# Patient Record
Sex: Female | Born: 1945 | Race: White | Hispanic: No | Marital: Married | Smoking: Current every day smoker
Health system: Southern US, Community
[De-identification: ages and names within clinical notes are randomized; demographics above are authoritative.]

## PROBLEM LIST (undated history)

## (undated) DIAGNOSIS — J449 Chronic obstructive pulmonary disease, unspecified: Secondary | ICD-10-CM

## (undated) DIAGNOSIS — F329 Major depressive disorder, single episode, unspecified: Secondary | ICD-10-CM

## (undated) DIAGNOSIS — G8929 Other chronic pain: Secondary | ICD-10-CM

## (undated) DIAGNOSIS — M1990 Unspecified osteoarthritis, unspecified site: Secondary | ICD-10-CM

## (undated) DIAGNOSIS — K635 Polyp of colon: Secondary | ICD-10-CM

## (undated) DIAGNOSIS — I639 Cerebral infarction, unspecified: Secondary | ICD-10-CM

## (undated) DIAGNOSIS — I10 Essential (primary) hypertension: Secondary | ICD-10-CM

## (undated) DIAGNOSIS — K222 Esophageal obstruction: Secondary | ICD-10-CM

## (undated) DIAGNOSIS — E039 Hypothyroidism, unspecified: Secondary | ICD-10-CM

## (undated) DIAGNOSIS — E079 Disorder of thyroid, unspecified: Secondary | ICD-10-CM

## (undated) DIAGNOSIS — R911 Solitary pulmonary nodule: Secondary | ICD-10-CM

## (undated) DIAGNOSIS — F32A Depression, unspecified: Secondary | ICD-10-CM

## (undated) DIAGNOSIS — F419 Anxiety disorder, unspecified: Secondary | ICD-10-CM

## (undated) DIAGNOSIS — E876 Hypokalemia: Secondary | ICD-10-CM

## (undated) DIAGNOSIS — IMO0001 Reserved for inherently not codable concepts without codable children: Secondary | ICD-10-CM

## (undated) DIAGNOSIS — K3184 Gastroparesis: Secondary | ICD-10-CM

## (undated) DIAGNOSIS — G2581 Restless legs syndrome: Secondary | ICD-10-CM

## (undated) DIAGNOSIS — F40240 Claustrophobia: Secondary | ICD-10-CM

## (undated) DIAGNOSIS — E2839 Other primary ovarian failure: Secondary | ICD-10-CM

## (undated) DIAGNOSIS — Z87442 Personal history of urinary calculi: Secondary | ICD-10-CM

## (undated) DIAGNOSIS — K219 Gastro-esophageal reflux disease without esophagitis: Secondary | ICD-10-CM

## (undated) DIAGNOSIS — Q393 Congenital stenosis and stricture of esophagus: Secondary | ICD-10-CM

## (undated) DIAGNOSIS — K589 Irritable bowel syndrome without diarrhea: Secondary | ICD-10-CM

## (undated) DIAGNOSIS — E7800 Pure hypercholesterolemia, unspecified: Secondary | ICD-10-CM

## (undated) DIAGNOSIS — C189 Malignant neoplasm of colon, unspecified: Secondary | ICD-10-CM

## (undated) DIAGNOSIS — K649 Unspecified hemorrhoids: Secondary | ICD-10-CM

## (undated) HISTORY — DX: Cerebral infarction, unspecified: I63.9

## (undated) HISTORY — PX: CARPAL TUNNEL RELEASE: SHX101

## (undated) HISTORY — DX: Esophageal obstruction: K22.2

## (undated) HISTORY — DX: Hypokalemia: E87.6

## (undated) HISTORY — DX: Unspecified hemorrhoids: K64.9

## (undated) HISTORY — DX: Gastro-esophageal reflux disease without esophagitis: K21.9

## (undated) HISTORY — DX: Solitary pulmonary nodule: R91.1

## (undated) HISTORY — DX: Claustrophobia: F40.240

## (undated) HISTORY — DX: Other primary ovarian failure: E28.39

## (undated) HISTORY — PX: OTHER SURGICAL HISTORY: SHX169

## (undated) HISTORY — DX: Polyp of colon: K63.5

## (undated) HISTORY — DX: Malignant neoplasm of colon, unspecified: C18.9

## (undated) HISTORY — DX: Other chronic pain: G89.29

## (undated) HISTORY — DX: Congenital stenosis and stricture of esophagus: Q39.3

## (undated) HISTORY — DX: Reserved for inherently not codable concepts without codable children: IMO0001

---

## 1970-04-21 HISTORY — PX: TUBAL LIGATION: SHX77

## 1976-04-21 HISTORY — PX: CHOLECYSTECTOMY: SHX55

## 1997-08-26 ENCOUNTER — Emergency Department (HOSPITAL_COMMUNITY): Admission: EM | Admit: 1997-08-26 | Discharge: 1997-08-26 | Payer: Self-pay | Admitting: Cardiology

## 1999-04-22 DIAGNOSIS — I639 Cerebral infarction, unspecified: Secondary | ICD-10-CM

## 1999-04-22 HISTORY — DX: Cerebral infarction, unspecified: I63.9

## 2000-10-30 ENCOUNTER — Encounter: Payer: Self-pay | Admitting: *Deleted

## 2000-10-30 ENCOUNTER — Emergency Department (HOSPITAL_COMMUNITY): Admission: EM | Admit: 2000-10-30 | Discharge: 2000-10-30 | Payer: Self-pay | Admitting: *Deleted

## 2001-09-05 ENCOUNTER — Emergency Department (HOSPITAL_COMMUNITY): Admission: EM | Admit: 2001-09-05 | Discharge: 2001-09-05 | Payer: Self-pay | Admitting: Emergency Medicine

## 2002-05-02 ENCOUNTER — Ambulatory Visit (HOSPITAL_COMMUNITY): Admission: RE | Admit: 2002-05-02 | Discharge: 2002-05-02 | Payer: Self-pay | Admitting: Internal Medicine

## 2002-11-24 ENCOUNTER — Emergency Department (HOSPITAL_COMMUNITY): Admission: EM | Admit: 2002-11-24 | Discharge: 2002-11-24 | Payer: Self-pay | Admitting: *Deleted

## 2002-12-02 ENCOUNTER — Encounter: Payer: Self-pay | Admitting: Emergency Medicine

## 2002-12-02 ENCOUNTER — Emergency Department (HOSPITAL_COMMUNITY): Admission: EM | Admit: 2002-12-02 | Discharge: 2002-12-02 | Payer: Self-pay | Admitting: Emergency Medicine

## 2003-02-12 ENCOUNTER — Emergency Department (HOSPITAL_COMMUNITY): Admission: EM | Admit: 2003-02-12 | Discharge: 2003-02-12 | Payer: Self-pay | Admitting: Emergency Medicine

## 2003-07-11 ENCOUNTER — Emergency Department (HOSPITAL_COMMUNITY): Admission: EM | Admit: 2003-07-11 | Discharge: 2003-07-11 | Payer: Self-pay | Admitting: Emergency Medicine

## 2004-10-26 ENCOUNTER — Emergency Department (HOSPITAL_COMMUNITY): Admission: EM | Admit: 2004-10-26 | Discharge: 2004-10-26 | Payer: Self-pay | Admitting: Emergency Medicine

## 2005-03-05 ENCOUNTER — Emergency Department (HOSPITAL_COMMUNITY): Admission: EM | Admit: 2005-03-05 | Discharge: 2005-03-06 | Payer: Self-pay | Admitting: Emergency Medicine

## 2005-03-15 ENCOUNTER — Emergency Department (HOSPITAL_COMMUNITY): Admission: EM | Admit: 2005-03-15 | Discharge: 2005-03-16 | Payer: Self-pay | Admitting: Emergency Medicine

## 2006-04-16 ENCOUNTER — Emergency Department (HOSPITAL_COMMUNITY): Admission: EM | Admit: 2006-04-16 | Discharge: 2006-04-16 | Payer: Self-pay | Admitting: Emergency Medicine

## 2006-04-21 HISTORY — PX: KIDNEY STONE SURGERY: SHX686

## 2006-05-06 ENCOUNTER — Ambulatory Visit (HOSPITAL_COMMUNITY): Admission: RE | Admit: 2006-05-06 | Discharge: 2006-05-06 | Payer: Self-pay | Admitting: Urology

## 2006-05-19 ENCOUNTER — Ambulatory Visit (HOSPITAL_COMMUNITY): Admission: RE | Admit: 2006-05-19 | Discharge: 2006-05-19 | Payer: Self-pay

## 2006-05-26 ENCOUNTER — Observation Stay (HOSPITAL_COMMUNITY): Admission: RE | Admit: 2006-05-26 | Discharge: 2006-05-27 | Payer: Self-pay | Admitting: Urology

## 2006-06-01 ENCOUNTER — Ambulatory Visit (HOSPITAL_COMMUNITY): Admission: RE | Admit: 2006-06-01 | Discharge: 2006-06-01 | Payer: Self-pay | Admitting: Urology

## 2006-06-02 ENCOUNTER — Ambulatory Visit (HOSPITAL_COMMUNITY): Admission: RE | Admit: 2006-06-02 | Discharge: 2006-06-02 | Payer: Self-pay | Admitting: Urology

## 2006-09-18 ENCOUNTER — Ambulatory Visit: Payer: Self-pay | Admitting: Internal Medicine

## 2006-09-20 HISTORY — PX: COLONOSCOPY: SHX174

## 2006-09-30 ENCOUNTER — Ambulatory Visit (HOSPITAL_COMMUNITY): Admission: RE | Admit: 2006-09-30 | Discharge: 2006-09-30 | Payer: Self-pay | Admitting: Internal Medicine

## 2006-09-30 ENCOUNTER — Ambulatory Visit: Payer: Self-pay | Admitting: Internal Medicine

## 2006-11-11 ENCOUNTER — Ambulatory Visit: Payer: Self-pay | Admitting: Cardiovascular Disease

## 2006-11-13 ENCOUNTER — Ambulatory Visit: Payer: Self-pay | Admitting: Internal Medicine

## 2006-11-19 ENCOUNTER — Ambulatory Visit: Payer: Self-pay | Admitting: Cardiology

## 2006-11-19 ENCOUNTER — Encounter (HOSPITAL_COMMUNITY): Admission: RE | Admit: 2006-11-19 | Discharge: 2006-12-19 | Payer: Self-pay | Admitting: Cardiovascular Disease

## 2007-02-26 ENCOUNTER — Ambulatory Visit: Payer: Self-pay | Admitting: Internal Medicine

## 2007-03-01 ENCOUNTER — Ambulatory Visit (HOSPITAL_COMMUNITY): Admission: RE | Admit: 2007-03-01 | Discharge: 2007-03-01 | Payer: Self-pay | Admitting: Internal Medicine

## 2007-03-05 ENCOUNTER — Encounter (HOSPITAL_COMMUNITY): Admission: RE | Admit: 2007-03-05 | Discharge: 2007-04-04 | Payer: Self-pay | Admitting: Internal Medicine

## 2007-05-05 ENCOUNTER — Ambulatory Visit: Payer: Self-pay | Admitting: Internal Medicine

## 2007-06-18 ENCOUNTER — Ambulatory Visit: Payer: Self-pay | Admitting: Internal Medicine

## 2007-08-12 ENCOUNTER — Ambulatory Visit: Payer: Self-pay | Admitting: Internal Medicine

## 2007-08-17 ENCOUNTER — Ambulatory Visit (HOSPITAL_COMMUNITY): Admission: RE | Admit: 2007-08-17 | Discharge: 2007-08-17 | Payer: Self-pay | Admitting: Internal Medicine

## 2007-10-14 ENCOUNTER — Emergency Department (HOSPITAL_COMMUNITY): Admission: EM | Admit: 2007-10-14 | Discharge: 2007-10-14 | Payer: Self-pay | Admitting: Emergency Medicine

## 2007-10-21 ENCOUNTER — Ambulatory Visit: Payer: Self-pay | Admitting: Gastroenterology

## 2007-12-08 ENCOUNTER — Encounter (HOSPITAL_COMMUNITY): Admission: RE | Admit: 2007-12-08 | Discharge: 2008-01-07 | Payer: Self-pay | Admitting: Orthopaedic Surgery

## 2007-12-09 DIAGNOSIS — K219 Gastro-esophageal reflux disease without esophagitis: Secondary | ICD-10-CM | POA: Insufficient documentation

## 2007-12-09 DIAGNOSIS — K3184 Gastroparesis: Secondary | ICD-10-CM | POA: Insufficient documentation

## 2007-12-09 DIAGNOSIS — R143 Flatulence: Secondary | ICD-10-CM

## 2007-12-09 DIAGNOSIS — R141 Gas pain: Secondary | ICD-10-CM | POA: Insufficient documentation

## 2007-12-09 DIAGNOSIS — K449 Diaphragmatic hernia without obstruction or gangrene: Secondary | ICD-10-CM | POA: Insufficient documentation

## 2007-12-09 DIAGNOSIS — K644 Residual hemorrhoidal skin tags: Secondary | ICD-10-CM | POA: Insufficient documentation

## 2007-12-09 DIAGNOSIS — R5383 Other fatigue: Secondary | ICD-10-CM | POA: Insufficient documentation

## 2007-12-09 DIAGNOSIS — R142 Eructation: Secondary | ICD-10-CM

## 2007-12-09 DIAGNOSIS — R110 Nausea: Secondary | ICD-10-CM | POA: Insufficient documentation

## 2008-01-11 ENCOUNTER — Encounter (HOSPITAL_COMMUNITY): Admission: RE | Admit: 2008-01-11 | Discharge: 2008-01-18 | Payer: Self-pay | Admitting: Orthopaedic Surgery

## 2008-01-21 ENCOUNTER — Encounter (HOSPITAL_COMMUNITY): Admission: RE | Admit: 2008-01-21 | Discharge: 2008-02-20 | Payer: Self-pay | Admitting: Orthopaedic Surgery

## 2008-01-25 ENCOUNTER — Ambulatory Visit (HOSPITAL_COMMUNITY): Admission: RE | Admit: 2008-01-25 | Discharge: 2008-01-25 | Payer: Self-pay | Admitting: Pulmonary Disease

## 2008-02-14 ENCOUNTER — Encounter: Admission: RE | Admit: 2008-02-14 | Discharge: 2008-02-14 | Payer: Self-pay | Admitting: Pulmonary Disease

## 2008-03-01 ENCOUNTER — Encounter: Admission: RE | Admit: 2008-03-01 | Discharge: 2008-03-01 | Payer: Self-pay | Admitting: Pulmonary Disease

## 2008-03-21 ENCOUNTER — Encounter: Admission: RE | Admit: 2008-03-21 | Discharge: 2008-03-21 | Payer: Self-pay | Admitting: Pulmonary Disease

## 2008-05-04 ENCOUNTER — Ambulatory Visit (HOSPITAL_COMMUNITY): Admission: RE | Admit: 2008-05-04 | Discharge: 2008-05-04 | Payer: Self-pay | Admitting: Pulmonary Disease

## 2008-05-16 ENCOUNTER — Emergency Department (HOSPITAL_COMMUNITY): Admission: EM | Admit: 2008-05-16 | Discharge: 2008-05-17 | Payer: Self-pay | Admitting: Emergency Medicine

## 2008-06-21 ENCOUNTER — Ambulatory Visit: Payer: Self-pay | Admitting: Internal Medicine

## 2008-07-25 ENCOUNTER — Ambulatory Visit (HOSPITAL_COMMUNITY): Admission: RE | Admit: 2008-07-25 | Discharge: 2008-07-25 | Payer: Self-pay | Admitting: Orthopaedic Surgery

## 2008-08-03 ENCOUNTER — Ambulatory Visit: Payer: Self-pay | Admitting: Internal Medicine

## 2008-08-04 DIAGNOSIS — K5900 Constipation, unspecified: Secondary | ICD-10-CM | POA: Insufficient documentation

## 2008-09-01 ENCOUNTER — Encounter: Payer: Self-pay | Admitting: Internal Medicine

## 2008-09-26 ENCOUNTER — Ambulatory Visit (HOSPITAL_COMMUNITY): Admission: RE | Admit: 2008-09-26 | Discharge: 2008-09-26 | Payer: Self-pay | Admitting: Orthopaedic Surgery

## 2008-12-27 ENCOUNTER — Encounter (HOSPITAL_COMMUNITY): Admission: RE | Admit: 2008-12-27 | Discharge: 2009-01-17 | Payer: Self-pay | Admitting: Orthopaedic Surgery

## 2009-01-01 ENCOUNTER — Ambulatory Visit (HOSPITAL_COMMUNITY): Admission: RE | Admit: 2009-01-01 | Discharge: 2009-01-01 | Payer: Self-pay | Admitting: Pulmonary Disease

## 2009-01-01 ENCOUNTER — Ambulatory Visit: Payer: Self-pay | Admitting: Cardiology

## 2009-01-01 ENCOUNTER — Encounter (INDEPENDENT_AMBULATORY_CARE_PROVIDER_SITE_OTHER): Payer: Self-pay | Admitting: Pulmonary Disease

## 2009-01-12 ENCOUNTER — Ambulatory Visit: Payer: Self-pay | Admitting: Internal Medicine

## 2009-01-16 ENCOUNTER — Encounter: Payer: Self-pay | Admitting: Internal Medicine

## 2009-01-19 ENCOUNTER — Encounter (HOSPITAL_COMMUNITY): Admission: RE | Admit: 2009-01-19 | Discharge: 2009-02-18 | Payer: Self-pay | Admitting: Orthopaedic Surgery

## 2009-01-23 ENCOUNTER — Telehealth (INDEPENDENT_AMBULATORY_CARE_PROVIDER_SITE_OTHER): Payer: Self-pay

## 2009-01-30 ENCOUNTER — Telehealth (INDEPENDENT_AMBULATORY_CARE_PROVIDER_SITE_OTHER): Payer: Self-pay

## 2009-02-01 ENCOUNTER — Encounter: Payer: Self-pay | Admitting: Internal Medicine

## 2009-02-05 ENCOUNTER — Encounter (HOSPITAL_COMMUNITY): Admission: RE | Admit: 2009-02-05 | Discharge: 2009-03-07 | Payer: Self-pay | Admitting: Internal Medicine

## 2009-02-22 ENCOUNTER — Encounter: Payer: Self-pay | Admitting: Urgent Care

## 2009-03-13 ENCOUNTER — Emergency Department (HOSPITAL_COMMUNITY): Admission: EM | Admit: 2009-03-13 | Discharge: 2009-03-14 | Payer: Self-pay | Admitting: Emergency Medicine

## 2009-03-19 ENCOUNTER — Telehealth (INDEPENDENT_AMBULATORY_CARE_PROVIDER_SITE_OTHER): Payer: Self-pay

## 2009-04-10 ENCOUNTER — Telehealth (INDEPENDENT_AMBULATORY_CARE_PROVIDER_SITE_OTHER): Payer: Self-pay

## 2009-04-17 ENCOUNTER — Encounter: Payer: Self-pay | Admitting: Internal Medicine

## 2009-04-21 HISTORY — PX: FRACTURE SURGERY: SHX138

## 2009-04-30 ENCOUNTER — Encounter: Payer: Self-pay | Admitting: Gastroenterology

## 2009-05-01 ENCOUNTER — Ambulatory Visit (HOSPITAL_COMMUNITY): Admission: RE | Admit: 2009-05-01 | Discharge: 2009-05-02 | Payer: Self-pay | Admitting: Orthopaedic Surgery

## 2009-05-04 ENCOUNTER — Encounter: Payer: Self-pay | Admitting: Gastroenterology

## 2009-05-24 ENCOUNTER — Encounter: Payer: Self-pay | Admitting: Gastroenterology

## 2009-07-02 ENCOUNTER — Ambulatory Visit: Payer: Self-pay | Admitting: Internal Medicine

## 2009-07-10 ENCOUNTER — Encounter: Payer: Self-pay | Admitting: Internal Medicine

## 2009-07-16 ENCOUNTER — Telehealth (INDEPENDENT_AMBULATORY_CARE_PROVIDER_SITE_OTHER): Payer: Self-pay

## 2009-07-26 ENCOUNTER — Encounter: Payer: Self-pay | Admitting: Gastroenterology

## 2009-08-09 ENCOUNTER — Encounter: Payer: Self-pay | Admitting: Gastroenterology

## 2009-09-19 ENCOUNTER — Ambulatory Visit: Payer: Self-pay | Admitting: Internal Medicine

## 2009-09-19 DIAGNOSIS — R1013 Epigastric pain: Secondary | ICD-10-CM | POA: Insufficient documentation

## 2009-09-20 ENCOUNTER — Encounter: Payer: Self-pay | Admitting: Internal Medicine

## 2009-09-24 LAB — CONVERTED CEMR LAB
Alkaline Phosphatase: 95 units/L (ref 39–117)
Bilirubin, Direct: 0.1 mg/dL (ref 0.0–0.3)
HCT: 38 % (ref 36.0–46.0)
Indirect Bilirubin: 0.3 mg/dL (ref 0.0–0.9)
Lymphocytes Relative: 25 % (ref 12–46)
Lymphs Abs: 2 10*3/uL (ref 0.7–4.0)
Monocytes Relative: 6 % (ref 3–12)
Neutrophils Relative %: 69 % (ref 43–77)
Platelets: 248 10*3/uL (ref 150–400)
RBC: 4.32 M/uL (ref 3.87–5.11)
Total Bilirubin: 0.4 mg/dL (ref 0.3–1.2)
WBC: 8 10*3/uL (ref 4.0–10.5)

## 2009-10-01 ENCOUNTER — Encounter: Payer: Self-pay | Admitting: Urgent Care

## 2009-10-04 ENCOUNTER — Ambulatory Visit: Payer: Self-pay | Admitting: Gastroenterology

## 2009-10-09 ENCOUNTER — Ambulatory Visit (HOSPITAL_COMMUNITY): Admission: RE | Admit: 2009-10-09 | Discharge: 2009-10-09 | Payer: Self-pay | Admitting: Internal Medicine

## 2009-12-14 ENCOUNTER — Ambulatory Visit: Payer: Self-pay | Admitting: Internal Medicine

## 2009-12-21 ENCOUNTER — Encounter: Payer: Self-pay | Admitting: Internal Medicine

## 2009-12-27 ENCOUNTER — Telehealth (INDEPENDENT_AMBULATORY_CARE_PROVIDER_SITE_OTHER): Payer: Self-pay

## 2010-01-03 ENCOUNTER — Encounter: Payer: Self-pay | Admitting: Gastroenterology

## 2010-01-17 ENCOUNTER — Encounter: Payer: Self-pay | Admitting: Gastroenterology

## 2010-01-24 ENCOUNTER — Emergency Department (HOSPITAL_COMMUNITY)
Admission: EM | Admit: 2010-01-24 | Discharge: 2010-01-24 | Payer: Self-pay | Source: Home / Self Care | Admitting: Emergency Medicine

## 2010-02-19 ENCOUNTER — Encounter: Payer: Self-pay | Admitting: Urgent Care

## 2010-02-25 ENCOUNTER — Encounter: Payer: Self-pay | Admitting: Urgent Care

## 2010-02-27 ENCOUNTER — Telehealth (INDEPENDENT_AMBULATORY_CARE_PROVIDER_SITE_OTHER): Payer: Self-pay

## 2010-02-27 ENCOUNTER — Encounter: Payer: Self-pay | Admitting: Gastroenterology

## 2010-03-12 ENCOUNTER — Ambulatory Visit: Payer: Self-pay | Admitting: Internal Medicine

## 2010-03-18 ENCOUNTER — Encounter: Payer: Self-pay | Admitting: Internal Medicine

## 2010-03-21 ENCOUNTER — Encounter: Payer: Self-pay | Admitting: Gastroenterology

## 2010-04-21 HISTORY — PX: ESOPHAGOGASTRODUODENOSCOPY: SHX1529

## 2010-05-01 ENCOUNTER — Telehealth (INDEPENDENT_AMBULATORY_CARE_PROVIDER_SITE_OTHER): Payer: Self-pay

## 2010-05-02 ENCOUNTER — Encounter: Payer: Self-pay | Admitting: Internal Medicine

## 2010-05-03 ENCOUNTER — Encounter: Payer: Self-pay | Admitting: Internal Medicine

## 2010-05-10 ENCOUNTER — Ambulatory Visit (HOSPITAL_COMMUNITY)
Admission: RE | Admit: 2010-05-10 | Discharge: 2010-05-10 | Payer: Self-pay | Source: Home / Self Care | Attending: Internal Medicine | Admitting: Internal Medicine

## 2010-05-12 ENCOUNTER — Encounter: Payer: Self-pay | Admitting: Internal Medicine

## 2010-05-21 NOTE — Medication Information (Signed)
Summary: ONDANSETRON HCL 4MG   ONDANSETRON HCL 4MG    Imported By: Rexene Alberts 03/21/2010 10:16:13  _____________________________________________________________________  External Attachment:    Type:   Image     Comment:   External Document  Appended Document: ONDANSETRON HCL 4MG  sent rx at office visit a few days ago. please check with pharmacy to ensure they have it.  Appended Document: ONDANSETRON HCL 4MG  [facility] [place] [facility]. She said Rx has been filled, she will correct so it doesn't get sent back.

## 2010-05-21 NOTE — Letter (Signed)
Summary: [facility] REFERRAL  [facility] REFERRAL   Imported By: Ave Filter 03/18/2010 10:03:47  _____________________________________________________________________  External Attachment:    Type:   Image     Comment:   External Document  Appended Document: [facility] REFERRAL Pt has an appt 06/10/10@2 :30..Pt aware of appt.

## 2010-05-21 NOTE — Medication Information (Signed)
Summary: Tax adviser   Imported By: Diana Eves 07/26/2009 08:41:20  _____________________________________________________________________  External Attachment:    Type:   Image     Comment:   External Document  Appended Document: RX Folder-zofran    Prescriptions: ONDANSETRON HCL 4 MG TABS (ONDANSETRON HCL) one by mouth q6 hrs as needed nausea/vomiting  #60 x 0   Entered and Authorized by:   Leanna Battles. Dixon Boos   Signed by:   Leanna Battles Nayali Talerico PA-C on 07/27/2009   Method used:   [facility]  [place] [address]       [address]       [place], [place]  [address]       Ph: [phone] or [phone]       Fax: [phone]   RxID:   [id number]

## 2010-05-21 NOTE — Medication Information (Signed)
Summary: PROMETHAZINE 25MG   PROMETHAZINE 25MG    Imported By: Rexene Alberts 02/27/2010 08:32:19  _____________________________________________________________________  External Attachment:    Type:   Image     Comment:   External Document  Appended Document: PROMETHAZINE 25MG     Prescriptions: PROMETHAZINE HCL 25 MG SUPP (PROMETHAZINE HCL) one pr every 6 hours as needed n/v  #20 x 0   Entered and Authorized by:   Leanna Battles. Dixon Boos   Signed by:   Leanna Battles Lewis PA-C on 02/27/2010   Method used:   [facility]  [place] [address] [facility]       [address]       [place], [place]  [address]       Ph: [phone] or [phone]       Fax: [phone]   RxID:   [id number]     Appended Document: PROMETHAZINE 25MG  too much telephone traffic; she needs to be re-assessed in the office; please get her in to see extender wn next week , please  Appended Document: PROMETHAZINE 25MG  Informed pt.   Appended Document: PROMETHAZINE 25MG  pt aware of appt for 11/14 @ 11 w/AS

## 2010-05-21 NOTE — Medication Information (Signed)
Summary: Tax adviser   Imported By: Diana Eves 10/01/2009 11:11:07  _____________________________________________________________________  External Attachment:    Type:   Image     Comment:   External Document  Appended Document: RX Folder    Prescriptions: HYOSCYAMINE SULFATE 0.125 MG TABS (HYOSCYAMINE SULFATE) 1 by mouth ac/hs as needed (up to QID) for abd pain  #90 x 2   Entered and Authorized by:   Joselyn Arrow FNP-BC   Signed by:   Joselyn Arrow FNP-BC on 10/01/2009   Method used:   [facility]        [facility]  [place] [address] [facility]       [address]       [place], [place]  [address]       Ph: [phone] or [phone]       Fax: [phone]   RxID:   [id number]

## 2010-05-21 NOTE — Progress Notes (Signed)
Summary: phone note/nausea  Phone Note Call from Patient   Caller: Patient Summary of Call: Pt called and said she is having alot of nausea everyday, and sometimes vomiting. She is taking the Zofran one every 6 hours as directed and it doesn't come close to stopping the nausea. She said that Dr. Jena Gauss gave her some options when she was in office recently. One was referring to [facility]. She said she would not have transportation, she can't drive there and  has no one to depend on. Another was he suggested he could increase her Domperidone. ( She hasn't received last Rx from [place], but they say it can take up to 12-14 days.) She said please give her something to get rid of the nausea, it is horrible. I told her Dr. Jena Gauss is at the hospital and I will let him know, and get back with her. Initial call taken by: Cloria Spring LPN,  December 27, 2009 1:28 PM     Appended Document: phone note/nausea Being completely out of domperidone likely has much to do w symptoms; Rx Phenergan 25 mg suppositiories #20 - one PR q 6 hours - no refill - re-visit gastroparesis diet.  Appended Document: phone note/nausea Pt informed. [facility] [place] [facility] [place].

## 2010-05-21 NOTE — Progress Notes (Signed)
Summary: nausea  Phone Note Call from Patient Call back at Home Phone [phone]   Caller: Patient Summary of Call: pt called- she is still having alot of nausea. She is using the phenergan supp daily. She has some nausea and diarrhea at times. she stated she was following a gastroparesis diet.  pt wants to know if she should increase her domperidone and if she can have refill on phenergan supp. pt is aware RMR is out of the office and will not be back untill tomorrow.  Initial call taken by: Hendricks Limes LPN,  February 27, 2010 2:26 PM     Appended Document: nausea Please see note under 02/27/2010 RX Folder by Dr. Jena Gauss.  Appended Document: RE-ASSESSED IN OV NEXT WK BY EXTENDER PER RMR/SS Spoke with Dr. Jena Gauss about pt and medication management. Considered adding erythromycin. Spoke with pt today who states she is doing considerably better with n/v. taking zofran. will hold off on erythromycin for now, as pt has extensive med list which includes a statin. follow-up with pt in a few weeks to see how she is doing; if not better, may need to proceed with EGD to assess intermittent LUQ/epigastric pain.  Appended Document: RE-ASSESSED IN OV NEXT WK BY EXTENDER PER RMR/SS Pt has been taking off statin, no contraindication to begin erythromycin. will send to pharmacy of choice. instructed on administration. stated understanding.

## 2010-05-21 NOTE — Letter (Signed)
Summary: ondansetron hcl 4mg   ondansetron hcl 4mg    Imported By: Rexene Alberts 01/17/2010 12:04:09  _____________________________________________________________________  External Attachment:    Type:   Image     Comment:   External Document  Appended Document: ondansetron hcl 4mg     Prescriptions: ONDANSETRON HCL 4 MG TABS (ONDANSETRON HCL) one by mouth q6 hrs as needed nausea/vomiting  #120 x 1   Entered and Authorized by:   Leanna Battles. Dixon Boos   Signed by:   Leanna Battles Lewis PA-C on 01/18/2010   Method used:   [facility]  [place] [address]       [address]       [place], [place]  [address]       Ph: [phone] or [phone]       Fax: [phone]   RxID:   [id number]

## 2010-05-21 NOTE — Medication Information (Signed)
Summary: NEXIUM DR 40MG  CAPSULE  NEXIUM DR 40MG  CAPSULE   Imported By: Rexene Alberts 03/21/2010 15:35:14  _____________________________________________________________________  External Attachment:    Type:   Image     Comment:   External Document  Appended Document: NEXIUM DR 40MG  CAPSULE    Please clarify dosing. Per our records, she is on once daily but the refill request is for two times a day.   Appended Document: NEXIUM DR 40MG  CAPSULE Pt said that Dr. Jena Gauss increased her Nexium to two times a day the last time she saw him. She is  willing to do the one daily and see how she does.  Appended Document: rx for nexium    Prescriptions: NEXIUM 40 MG CPDR (ESOMEPRAZOLE MAGNESIUM) one po once to twice daily 30 mins before a meal  #60 x 5   Entered and Authorized by:   Leanna Battles. Dixon Boos   Signed by:   Leanna Battles Nakima Fluegge PA-C on 03/22/2010   Method used:   [facility]  [place] [address]       [address]       [place], [place]  [address]       Ph: [phone] or [phone]       Fax: [phone]   RxID:   [id number]

## 2010-05-21 NOTE — Medication Information (Signed)
Summary: PA for dexilant  PA for dexilant   Imported By: Hendricks Limes LPN 09/81/1912 78:29:56  _____________________________________________________________________  External Attachment:    Type:   Image     Comment:   External Document

## 2010-05-21 NOTE — Medication Information (Signed)
Summary: HYOSCYAMINE SULF  HYOSCYAMINE SULF   Imported By: Rexene Alberts 02/25/2010 10:40:52  _____________________________________________________________________  External Attachment:    Type:   Image     Comment:   External Document  Appended Document: HYOSCYAMINE SULF    Prescriptions: HYOSCYAMINE SULFATE 0.125 MG TABS (HYOSCYAMINE SULFATE) 1 by mouth ac/hs as needed (up to QID) for abd pain  #90 x 15   Entered and Authorized by:   Joselyn Arrow FNP-BC   Signed by:   Joselyn Arrow FNP-BC on 02/25/2010   Method used:   [facility]        [facility]  [place] [address] [facility]       [address]       [place], [place]  [address]       Ph: [phone] or [phone]       Fax: [phone]   RxID:   [id number]

## 2010-05-21 NOTE — Medication Information (Signed)
Summary: Tax adviser   Imported By: Diana Eves 04/30/2009 08:58:31  _____________________________________________________________________  External Attachment:    Type:   Image     Comment:   External Document  Appended Document: RX Folder No further refills until seen by RMR.  Multiple cancelled OVs including one for today.  Appended Document: RX Folder Spoke with pt. She said she has had to see alot of doctors, having alot of problems and had arm  surgery on Tuesday this week. when she called she had to leave mesage because she didn't get anyone, she just left the message to cancel.  Appended Document: RX Folder Noted. Needs to RS OV with RMR only.  Appended Document: RX Folder tried to call pt x2- call would not go through  Appended Document: RX Folder pt called back and is aware and will make appt

## 2010-05-21 NOTE — Op Note (Signed)
  [name], Tracey Mccullough               ACCOUNT NO.:  [id number]  MEDICAL RECORD NO.:  [id number]          PATIENT TYPE:  AMB  LOCATION:  DAY                           FACILITY:  [facility]  PHYSICIAN:  R. Roetta Sessions, M.D. DATE OF BIRTH:  [birth year]  DATE OF PROCEDURE:  05/10/2010 DATE OF DISCHARGE:                              OPERATIVE REPORT   PROCEDURE:  Esophagogastroduodenoscopy with Elease Hashimoto dilation.  INDICATIONS FOR PROCEDURE:  A 65 year old lady with gastroparesis, chronic nausea, on chronic narcotic therapy, intolerant to REGLAN, currently on domperidone, Nexium, and erythromycin.  Complains of intermittent epigastric pain and recurrent esophageal dysphagia in the setting of Schatzki ring.  EGD now being done.  Risks, benefits, limitations, alternatives, and imponderable have been reviewed potential for esophageal dilation, discussed, questions answered, all parties agreeable.  PROCEDURE NOTE:  O2 saturation, blood pressure, pulse, and respirations were monitored throughout the entire procedure.  CONSCIOUS SEDATION:  Versed 7 mg IV and Demerol 150 mg IV in divided doses.  Phenergan 12.5 mg diluted slow IV push to augment conscious sedation.  FINDINGS:  Examination of the tubular esophagus revealed again a Schatzki ring.  Esophageal mucosa otherwise appeared normal.  EG junction was easily traversed with the scope.  Stomach:  Gastric cavity had a slight amount of food residue and it was easily washed and suctioned out.  The gastric cavity insufflated well with air.  Thorough examination of the gastric mucosa including retroflexed proximal stomach esophagogastric junction demonstrated only a moderate size hiatal hernia, again the ring was seen very well without retroflexed.  Pylorus was patent, easily traversed.  Examination of the bulb, second portion revealed no abnormalities.  Therapeutic/diagnostic maneuvers performed: Scope was withdrawn.  A 56-French Maloney dilators  passed full insertion with ease look back revealed the ring had been nicely ruptured at 7 o'clock position without apparent complication.  There was minimal bleeding.  The patient tolerated the procedure well and was reactive in endoscopy.  IMPRESSION: 1. Schatzki ring, otherwise normal esophagus, status post dilation and     disruption as described above. 2. Hiatal hernia, minimal retained gastric contents suctioned out,     otherwise normal stomach, patent pylorus, normal D1 and D2.  RECOMMENDATIONS: 1. Minimize use of narcotics. 2. Continue domperidone. 3. Continue erythromycin and continue Nexium. 4. Followup appointment with [place] in 3 months.     Jonathon Bellows, M.D.     RMR/MEDQ  D:  05/10/2010  T:  05/10/2010  Job:  [id number]  cc:   Ramon Dredge L. Juanetta Gosling, M.D. Fax: [phone]  Electronically Signed by Lorrin Goodell M.D. on 05/21/2010 01:38:48 PM

## 2010-05-21 NOTE — Medication Information (Signed)
Summary: pa for dexilant  pa for dexilant   Imported By: Hendricks Limes LPN 16/01/9603 54:09:81  _____________________________________________________________________  External Attachment:    Type:   Image     Comment:   External Document

## 2010-05-21 NOTE — Medication Information (Signed)
Summary: HYOSCYAMINE SULF 0.125MG   HYOSCYAMINE SULF 0.125MG    Imported By: Rexene Alberts 03/21/2010 10:14:20  _____________________________________________________________________  External Attachment:    Type:   Image     Comment:   External Document  Appended Document: HYOSCYAMINE SULF 0.125MG     Prescriptions: HYOSCYAMINE SULFATE 0.125 MG TABS (HYOSCYAMINE SULFATE) 1 by mouth ac/hs as needed (up to QID) for abd pain  #90 x 5   Entered and Authorized by:   Leanna Battles. Dixon Boos   Signed by:   Leanna Battles Janella Rogala PA-C on 03/21/2010   Method used:   [facility]  [place] [address]       [address]       [place], [place]  [address]       Ph: [phone] or [phone]       Fax: [phone]   RxID:   [id number]

## 2010-05-21 NOTE — Assessment & Plan Note (Signed)
Summary: fu ov 3 months/GERD,constipation/refills/ss   Visit Type:  Follow-up Visit  Chief Complaint:  F/U gerd/constipation.  History of Present Illness: 65 y/o caucasian female w/ hx gastroparesis..  Every day nausea, on Level 2 gastroparesis diet.  c/o postprandial bloating.  c/o nausea with over-eating.  Doesn't eat much but can't understand "why stomach wont go down?"  Ran out of domperidone.  Pt doesn't see much a difference with domperidone, but has not had it for a while.  Vomiting twice per month.  BM "good" w/ miralax 2-3 per day, denies rectal bleeding or melena.  On nexium BID because dexilant wasn't covered by medicaid.  Takes zofran as needed, had been taking two times a day, off x several wks due to the fact she ran out.  Current Medications (verified): 1)  Aggrenox 200 .... Take 1 Tablet By Mouth Two Times A Day 2)  Amlodipine Besy-Benazepril Hcl 10-20 Mg Caps (Amlodipine Besy-Benazepril Hcl) .... Take 1 Capsule By Mouth Once A Day 3)  Promethazine Hcl 25 Mg Tabs (Promethazine Hcl) .... Take 1 Tablet By Mouth Every 4-6 Hours As Needed 4)  Hydrocodone-Acetaminophen 10-500 Mg Tabs (Hydrocodone-Acetaminophen) .... Take 1 Tablet By Mouth Four Times A Day As Needed 5)  Alprazolam 1 Mg Tabs (Alprazolam) .... Take 1 Tablet By Mouth Three Times A Day 6)  Domperidone .... As Directed 7)  Lasix 40 Mg Tabs (Furosemide) .... Take 1 Tablet By Mouth Once A Day As Needed 8)  Simvastatin 40 Mg Tabs (Simvastatin) .... Take 1 Tablet By Mouth Once A Day 9)  Miralax  Powd (Polyethylene Glycol 3350) .... Once Daily 10)  Ondansetron Hcl 4 Mg Tabs (Ondansetron Hcl) .... One By Mouth Q6 Hrs As Needed Nausea/vomiting 11)  Nexium 40 Mg Cpdr (Esomeprazole Magnesium) .... One By Mouth 30 Mins Before Breakfast Daily 12)  Requip 1 Mg Tabs (Ropinirole Hcl) .... One Tablet At Bedtime 13)  Womens Multivitamin Plus  Tabs (Multiple Vitamins-Minerals)  Allergies (verified): 1)  ! Penicillin 2)  !  Reglan  Review of Systems      See HPI General:  Denies fever, chills, sweats, anorexia, fatigue, weakness, malaise, weight loss, and sleep disorder. CV:  Denies chest pains, angina, palpitations, syncope, dyspnea on exertion, orthopnea, PND, peripheral edema, and claudication. Resp:  Denies dyspnea at rest, dyspnea with exercise, cough, sputum, wheezing, coughing up blood, and pleurisy. GI:  Denies difficulty swallowing, pain on swallowing, jaundice, bloody BM's, black BMs, and fecal incontinence. Derm:  Denies rash, itching, dry skin, hives, moles, warts, and unhealing ulcers. Psych:  Denies depression, anxiety, memory loss, suicidal ideation, hallucinations, paranoia, phobia, and confusion.  Vital Signs:  Patient profile:   65 year old female Height:      63 inches Weight:      164 pounds BMI:     29.16 Temp:     97.8 degrees F oral Pulse rate:   72 / minute BP sitting:   138 / 78  (left arm) Cuff size:   regular  Physical Exam  General:  Well developed, well nourished, no acute distress. Head:  Normocephalic and atraumatic. Eyes:  Sclera clear Ears:  decreased auditory acuity.   Mouth:  No deformity or lesions, dentition normal. Neck:  Supple; no masses or thyromegaly. Heart:  Regular rate and rhythm; no murmurs, rubs,  or bruits. Abdomen:  Soft, nontender and nondistended. No masses, hepatosplenomegaly or hernias noted. Normal bowel sounds.without guarding and without rebound.   Msk:  Symmetrical with no gross deformities. Normal  posture. Extremities:  No clubbing, cyanosis, edema or deformities noted. Neurologic:  Alert and  oriented x4;  grossly normal neurologically. Skin:  Intact without significant lesions or rashes. Cervical Nodes:  No significant cervical adenopathy. Psych:  Alert and cooperative. Normal mood and affect.  Impression & Recommendations:  Problem # 1:  GERD (ICD-530.81) Occasional breakthrough on nexium two times a day, suspect worsened w/  gastroparesis.  Problem # 2:  Hx of GASTROPARESIS (ICD-536.3) Pt not taking domperidone.  She will resume this.  If no relief w/ this and small frequent meals, progressive GP diet, would suggest tertiary referral for ?other options, ie gastric pacing.  Problem # 3:  CONSTIPATION (ICD-564.00) Doing well on miralax.  Patient Instructions: 1)  Colonoscopy 2013 Prescriptions: ONDANSETRON HCL 4 MG TABS (ONDANSETRON HCL) one by mouth q6 hrs as needed nausea/vomiting  #60 x 0   Entered and Authorized by:   Joselyn Arrow FNP-BC   Signed by:   Joselyn Arrow FNP-BC on 07/02/2009   Method used:   [facility]  [place] [address]       [address]       [place], [place]  [address]       Ph: [phone] or [phone]       Fax: [phone]   RxID:   [id number]      Appended Document: Orders Update    Clinical Lists Changes  Orders: Added new Service order of Est. Patient Level III (27253) - Signed

## 2010-05-21 NOTE — Assessment & Plan Note (Signed)
Summary: DROPPED OFF STOOL/SS  pt returned ifobt and it was negative  Allergies: 1)  ! Penicillin 2)  ! Reglan  Other Orders: Immuno-chemical Fecal Occult (696[address])  Appended Document: DROPPED OFF STOOL/SS noted; await u/s report

## 2010-05-21 NOTE — Medication Information (Signed)
Summary: Tax adviser   Imported By: Rosine Beat 01/03/2010 08:40:02  _____________________________________________________________________  External Attachment:    Type:   Image     Comment:   External Document  Appended Document: RX Folder, phenergan    Prescriptions: PROMETHAZINE HCL 25 MG SUPP (PROMETHAZINE HCL) one pr every 6 hours as needed n/v  #20 x 0   Entered and Authorized by:   Leanna Battles. Dixon Boos   Signed by:   Leanna Battles Lewis PA-C on 01/03/2010   Method used:   [facility]  [place] [address] [facility]       [address]       [place], [place]  [address]       Ph: [phone] or [phone]       Fax: [phone]   RxID:   [id number]

## 2010-05-21 NOTE — Assessment & Plan Note (Signed)
Summary: STOMACH PAIN,NAUSEA,MEDS ARENT WORKING/SS   Visit Type:  Follow-up Visit Primary Care Provider:  hawkins  Chief Complaint:  nausea and abd pain.  History of Present Illness: Patient complains of ongoing nausea sometimes she vomits.  Takes MiraLax at bedtime sometimes has accident with a bowel movement in the the middle the night.  No Side effects with domperidone. Ran out recently.  Current Problems (verified): 1)  Colorectal Cancer, Family Hx  (ICD-V16.0) 2)  Abdominal Pain, Epigastric  (ICD-789.06) 3)  Constipation  (ICD-564.00) 4)  Hx of Fatigue  (ICD-780.79) 5)  Fh of Colon Cancer  (ICD-153.9) 6)  Fh of Leukemia  (ICD-208.90) 7)  Hx of Hemorrhoids, External  (ICD-455.3) 8)  Hx of Hiatal Hernia  (ICD-553.3) 9)  Hx of Gerd, Severe  (ICD-530.81) 10)  Hx of Gastroparesis  (ICD-536.3) 11)  Abdominal Bloating  (ICD-787.3) 12)  Nausea  (ICD-787.02)  Current Medications (verified): 1)  Aggrenox 200 .... Take 1 Tablet By Mouth Two Times A Day 2)  Amlodipine Besy-Benazepril Hcl 10-20 Mg Caps (Amlodipine Besy-Benazepril Hcl) .... Take 1 Capsule By Mouth Once A Day 3)  Hydrocodone-Acetaminophen 10-500 Mg Tabs (Hydrocodone-Acetaminophen) .... Take 1 Tablet By Mouth Four Times A Day As Needed 4)  Alprazolam 1 Mg Tabs (Alprazolam) .... Take 1 Tablet By Mouth Three Times A Day 5)  Domperidone 10 Mg .... Qid 6)  Lasix 40 Mg Tabs (Furosemide) .... Take 1 Tablet By Mouth Once A Day As Needed 7)  Simvastatin 40 Mg Tabs (Simvastatin) .... Take 1 Tablet By Mouth Once A Day 8)  Miralax  Powd (Polyethylene Glycol 3350) .... Once Daily 9)  Ondansetron Hcl 4 Mg Tabs (Ondansetron Hcl) .... One By Mouth Q6 Hrs As Needed Nausea/vomiting 10)  Nexium 40 Mg Cpdr (Esomeprazole Magnesium) .... One By Mouth 30 Mins Before Breakfast Daily 11)  Requip 1 Mg Tabs (Ropinirole Hcl) .... One Tablet At Bedtime 12)  Womens Multivitamin Plus  Tabs (Multiple Vitamins-Minerals) 13)  Magnesium 250 Mg .... Once  Daily 14)  Hyoscyamine Sulfate 0.125 Mg Tabs (Hyoscyamine Sulfate) .Marland Kitchen.. 1 By Mouth Ac/hs As Needed (Up To Qid) For Abd Pain  Allergies (verified): 1)  ! Penicillin 2)  ! Reglan  Past History:  Past Medical History: Last updated: Jan 16, 2009 Current Problems:  CONSTIPATION (ICD-564.00) Hx of FATIGUE (ICD-780.79) Family Hx of COLON CANCER (ICD-153.9) Family Hx of LEUKEMIA (ICD-208.90) Hx of HEMORRHOIDS, EXTERNAL (ICD-455.3) Hx of HIATAL HERNIA (ICD-553.3) Hx of GERD, SEVERE (ICD-530.81) Hx of GASTROPARESIS (ICD-536.3) ABDOMINAL BLOATING (ICD-787.3) NAUSEA (ICD-787.02)  Family History: Last updated: Jan 16, 2009 Father:deceased age 3 natural causes Mother:deceased age 70 stroke Siblings:1 brother alive and well 4 brothers deceased 3 unknown causes 1 colon cancer 4 sisters alive and well 1 deceased sister age 53yrs to to complications of strep throat  Social History: Last updated: Jan 16, 2009 Disabled  Divorced  Tobacco Use - Yes.  Alcohol Use - no Regular Exercise - yes Drug Use - no  Risk Factors: Exercise: yes (01/16/2009)  Risk Factors: Smoking Status: current (01/16/2009)  Past Surgical History: Cholecystectomy Tubal Ligation Benign Cyst Removal kidney stones wrist  Vital Signs:  Patient profile:   65 year old female Height:      63 inches Weight:      152 pounds BMI:     27.02 Temp:     97.9 degrees F oral Pulse rate:   80 / minute BP sitting:   104 / 70  (left arm) Cuff size:   regular  Vitals Entered By: Raynelle Fanning  [facility] LPN (December 14, 2009 9:32 AM)  Physical Exam  General:  alert conversant lady no acute distress Abdomen:  flat positive bowel sounds soft nontender no succussion splash  Impression & Recommendations: Impression: A 65 year old lady with chronic nausea borderline delayed gastric emptying in a setting of narcotic use. Chronic constipation likely related to same.  Recommendations:get medication refill  ;take domperidone as  directed.  Gastroparesis diet reviewed. Take MiraLax first thing in the morning and daily prn. Consider stopping hydrocodone for one week and see how she does.  I've actually ask her to do this. She is to call in one week and let us to how things are going after she's resumed domperidone.  Other Orders: Est. Patient Level III (96295)

## 2010-05-21 NOTE — Medication Information (Signed)
Summary: Tax adviser   Imported By: Diana Eves 05/04/2009 08:42:15  _____________________________________________________________________  External Attachment:    Type:   Image     Comment:   External Document  Appended Document: RX Folder - zofran    Prescriptions: ONDANSETRON HCL 4 MG TABS (ONDANSETRON HCL) one by mouth q6 hrs as needed nausea/vomiting  #30 x 0   Entered and Authorized by:   Leanna Battles. Dixon Boos   Signed by:   Leanna Battles Naseem Varden PA-C on 05/04/2009   Method used:   [facility]        [facility]  [place] [address] [facility]       [address]       [place], [place]  [address]       Ph: [phone] or [phone]       Fax: [phone]   RxID:   [id number]    No further refills without OV with RMR

## 2010-05-21 NOTE — Letter (Signed)
Summary: Internal Other [facility] Order  Internal Other [facility] Order   Imported By: Cloria Spring LPN 32/44/0102 72:53:66  _____________________________________________________________________  External Attachment:    Type:   Image     Comment:   External Document

## 2010-05-21 NOTE — Progress Notes (Signed)
Summary: Pt c/o colon and abd spasms  Phone Note Call from Patient   Caller: Patient Summary of Call: Pt left VM that she has been having spasms in her colon and lower abdomen.They are occurring everyday and sometimes are severe. She is also having some nausea and requests something to be called in for nausea. Pt said she is on clear liquids at this time because she has not received the Domperidone in mail. Please advise.  ( [facility] Initial call taken by: Cloria Spring LPN,  July 16, 2009 4:13 PM     Appended Document: Pt c/o colon and abd spasms Meds for spasms will make gastroparesis worse, but can take sparingly.  Levsin 0.125mg  two times a day as needed for stomach cramps, #60, 0RF.  Resume domperidone asap.  Call if worse.  Appended Document: Pt c/o colon and abd spasms Pt informed. [facility].  Appended Document: Pt c/o colon and abd spasms pt called- she stated "spasm meds" seemed to be working ok untill last night. Since last night she has had increased upper abd pain that radiates to her side and back with nausea and vomiting. she is still on step 1-full liquids- of her gastroparesis diet. Her domperidone has not come yet from [place] (she called them and she should have it by Monday) Pt stated the pain is terrible and wants to know if there is anything she can do? please advise  Appended Document: Pt c/o colon and abd spasms Back off to a clear liquid diet for now. Use Levsin 0.125 mg tablets sublingually AC and h.s.-may use up to 4 times daily for the next 2 days until things settled down. Begin domperidone when he arrives.  Appended Document: Pt c/o colon and abd spasms pt aware

## 2010-05-21 NOTE — Letter (Signed)
Summary: ABD U/S ORDER  ABD U/S ORDER   Imported By: Ave Filter 09/20/2009 10:49:47  _____________________________________________________________________  External Attachment:    Type:   Image     Comment:   External Document

## 2010-05-21 NOTE — Medication Information (Signed)
Summary: PROMETHAZINE 25MG   PROMETHAZINE 25MG    Imported By: Rexene Alberts 02/19/2010 08:53:20  _____________________________________________________________________  External Attachment:    Type:   Image     Comment:   External Document  Appended Document: PROMETHAZINE 25MG     Prescriptions: PROMETHAZINE HCL 25 MG SUPP (PROMETHAZINE HCL) one pr every 6 hours as needed n/v  #20 x 0   Entered and Authorized by:   Joselyn Arrow FNP-BC   Signed by:   Joselyn Arrow FNP-BC on 02/19/2010   Method used:   [facility]        [facility]  [place] [address] [facility]       [address]       [place], [place]  [address]       Ph: [phone] or [phone]       Fax: [phone]   RxID:   [id number]

## 2010-05-21 NOTE — Assessment & Plan Note (Signed)
Summary: follow up per KJ for Nausea- cdg   Visit Type:  Follow-up Visit Primary Care Provider:  hawkins  Chief Complaint:  nausea, severe abd pain, and bloating.  History of Present Illness: 65 year old lady with GERD/ erosive reflux esophagitis documented gastrioparesis and constipation here for followup. She's had intermittent waves of nausea and vomiting had been on domperidone came off that agent. Now back on domperidone 10 mg a.c. and h.s.; takes Nexium 40 mg each morning. She does have intermittent nausea and vomiting but feels domperidone does,  indeed, help.  has intermittent abdominal distention and bloating but not temporally related to vomiting. She underwent EGD and high-risk screening colonoscopy in 2008. Schatzki's ring was dilated at that time. Colonoscopy was negative. Because of her family history of colon cancer( brother age 73), will be due repeat screening exam in 2013. She's not had any dysphagia.  MiraLax controlling constipation very well.  Current Problems (verified): 1)  Constipation  (ICD-564.00) 2)  Hx of Fatigue  (ICD-780.79) 3)  Fh of Colon Cancer  (ICD-153.9) 4)  Fh of Leukemia  (ICD-208.90) 5)  Hx of Hemorrhoids, External  (ICD-455.3) 6)  Hx of Hiatal Hernia  (ICD-553.3) 7)  Hx of Gerd, Severe  (ICD-530.81) 8)  Hx of Gastroparesis  (ICD-536.3) 9)  Abdominal Bloating  (ICD-787.3) 10)  Nausea  (ICD-787.02)  Current Medications (verified): 1)  Aggrenox 200 .... Take 1 Tablet By Mouth Two Times A Day 2)  Amlodipine Besy-Benazepril Hcl 10-20 Mg Caps (Amlodipine Besy-Benazepril Hcl) .... Take 1 Capsule By Mouth Once A Day 3)  Promethazine Hcl 25 Mg Tabs (Promethazine Hcl) .... Take 1 Tablet By Mouth Every 4-6 Hours As Needed 4)  Hydrocodone-Acetaminophen 10-500 Mg Tabs (Hydrocodone-Acetaminophen) .... Take 1 Tablet By Mouth Four Times A Day As Needed 5)  Alprazolam 1 Mg Tabs (Alprazolam) .... Take 1 Tablet By Mouth Three Times A Day 6)  Domperidone .... Qid 7)   Lasix 40 Mg Tabs (Furosemide) .... Take 1 Tablet By Mouth Once A Day As Needed 8)  Simvastatin 40 Mg Tabs (Simvastatin) .... Take 1 Tablet By Mouth Once A Day 9)  Miralax  Powd (Polyethylene Glycol 3350) .... Once Daily 10)  Ondansetron Hcl 4 Mg Tabs (Ondansetron Hcl) .... One By Mouth Q6 Hrs As Needed Nausea/vomiting 11)  Nexium 40 Mg Cpdr (Esomeprazole Magnesium) .... One By Mouth 30 Mins Before Breakfast Daily 12)  Requip 1 Mg Tabs (Ropinirole Hcl) .... One Tablet At Bedtime 13)  Womens Multivitamin Plus  Tabs (Multiple Vitamins-Minerals) 14)  Magnesium 250 Mg .... Once Daily 15)  Hyoscyamine Sulfate 0.125 Mg Tabs (Hyoscyamine Sulfate) .... Two Times A Day As Needed  Allergies (verified): 1)  ! Penicillin 2)  ! Reglan  Past History:  Past Medical History: Last updated: January 28, 2009 Current Problems:  CONSTIPATION (ICD-564.00) Hx of FATIGUE (ICD-780.79) Family Hx of COLON CANCER (ICD-153.9) Family Hx of LEUKEMIA (ICD-208.90) Hx of HEMORRHOIDS, EXTERNAL (ICD-455.3) Hx of HIATAL HERNIA (ICD-553.3) Hx of GERD, SEVERE (ICD-530.81) Hx of GASTROPARESIS (ICD-536.3) ABDOMINAL BLOATING (ICD-787.3) NAUSEA (ICD-787.02)  Past Surgical History: Last updated: January 28, 2009 Cholecystectomy Tubal Ligation Benign Cyst Removal kidney stones  Family History: Last updated: 2009-01-28 Father:deceased age 29 natural causes Mother:deceased age 47 stroke Siblings:1 brother alive and well 4 brothers deceased 3 unknown causes 1 colon cancer 4 sisters alive and well 1 deceased sister age 22yrs to to complications of strep throat  Social History: Last updated: 28-Jan-2009 Disabled  Divorced  Tobacco Use - Yes.  Alcohol Use - no Regular  Exercise - yes Drug Use - no  Risk Factors: Exercise: yes (01/01/2009)  Risk Factors: Smoking Status: current (01/01/2009)  Vital Signs:  Patient profile:   65 year old female Height:      63 inches Weight:      154 pounds BMI:     27.38 Temp:      98.8 degrees F oral Pulse rate:   72 / minute BP sitting:   110 / 64  (left arm) Cuff size:   regular  Vitals Entered By: Hendricks Limes LPN (September 20, 5954 2:59 PM)  Physical Exam  General:  pleasant and in individual and no acute distress. Somewhat hard of hearing. Lungs:  clear to auscultation Heart:  regular rate and rhythm without murmur gallop or a Abdomen:  nondistended positive bowel sounds no succussion splash. Abdomen is entirely soft and nontender without appreciable mass or organomegaly  Impression & Recommendations: Impression: 65 year old lady with history of GERD and gastroparesis with intermittent nausea and vomiting although better with domperidone. Intermittent epigastric pain occurs 2-3 times weekly. Gallbladder is out. No dysphagia. No lower GI tract symptoms. No melena or rectal bleeding.  Recommendations: Continue domperidone 10 mg a.c. and h.s. and  increase Nexium to 40 mg orally twice daily,ie before breakfast and supper.  Hepatic profile, CBC, and lipase/ right upper quadrant ultrasound to assess for biliary dilation gallstone disease, etc.  Refille Zofran 4 mg tablets one every 6 hours p.r.n. nausea #60.  Other Orders: T-Hepatic Function [id number]) T-CBC w/Diff [id number]) T-Lipase [id number])  Appended Document: Orders Update    Clinical Lists Changes  Problems: Added new problem of ABDOMINAL PAIN, EPIGASTRIC (ICD-789.06) Added new problem of COLORECTAL CANCER, FAMILY HX (ICD-V16.0) Orders: Added new Service order of Est. Patient Level IV (35573) - Signed

## 2010-05-23 NOTE — Assessment & Plan Note (Signed)
Summary: RE-ASSESSED IN OV NEXT WK BY EXTENDER PER RMR/SS   Visit Type:  Follow-up Visit Primary Care [name]:  hawkins  CC:  nausea and and abd pain.  History of Present Illness: [name] is a pleasant 65 year old female who has a long-standing hx of chronic N/V, mild gastroparesis. She was on Reglan in the past but due to muscle twitching in March 2010 was switched to Domperidone 10 mg qac and at bedtime. This has been controlling the N/V fairly well. She was on phenergan, taking multiple doses, approximately one year ago; due to some symptoms congruent with tardive dyskinesia, this was changed to zofran. As of a few months ago, she actually started taking phenergan again, which she reports as alleviating some nausea. Reports continued N/V, intermittent epigastric pain and LUQ pain started over weekend, worsened with eating. Takes Nexium two times a day. Denies reflux. On narcotics and did take a week off as instructed by our office, yet she had no improvement in her symptoms. Last GES in Oct 2010 showed mild delayed gastric emptying. Last EGD/colon in June 2008:  Distal esophageal erosions consistent with erosive reflux esophagitis, Schatzki's ring, status post dilation disruption as described above, otherwise normal esophagus. Small hiatal hernia, linear antral erosions probably secondary to Aggrenox, otherwise normal gastric mucosa, patent pylorus, normal D-1  and D-2. Colonoscopy findings:  External hemorrhoids, otherwise normal rectum and  colon.   Current Medications (verified): 1)  Aggrenox 200 .... Take 1 Tablet By Mouth Two Times A Day 2)  Amlodipine Besy-Benazepril Hcl 10-20 Mg Caps (Amlodipine Besy-Benazepril Hcl) .... Take 1 Capsule By Mouth Once A Day 3)  Hydrocodone-Acetaminophen 10-500 Mg Tabs (Hydrocodone-Acetaminophen) .... Take 1 Tablet By Mouth Four Times A Day As Needed 4)  Alprazolam 1 Mg Tabs (Alprazolam) .... Take 1 Tablet By Mouth Three Times A Day 5)  Domperidone 10 Mg  .... Qid 6)  Lasix 40 Mg Tabs (Furosemide) .... Take 1 Tablet By Mouth Once A Day As Needed 7)  Simvastatin 40 Mg Tabs (Simvastatin) .... Take 1 Tablet By Mouth Once A Day 8)  Miralax  Powd (Polyethylene Glycol 3350) .... Once Daily 9)  Nexium 40 Mg Cpdr (Esomeprazole Magnesium) .... One By Mouth 30 Mins Before Breakfast Daily 10)  Requip 1 Mg Tabs (Ropinirole Hcl) .... One Tablet At Bedtime 11)  Magnesium 250 Mg .... Once Daily 12)  Hyoscyamine Sulfate 0.125 Mg Tabs (Hyoscyamine Sulfate) .Marland Kitchen.. 1 By Mouth Ac/hs As Needed (Up To Qid) For Abd Pain  Allergies (verified): 1)  ! Penicillin 2)  ! Reglan  Past History:  Past Medical History: Current Problems:  CONSTIPATION (ICD-564.00) Hx of FATIGUE (ICD-780.79) Family Hx of COLON CANCER (ICD-153.9) Family Hx of LEUKEMIA (ICD-208.90) Hx of HEMORRHOIDS, EXTERNAL (ICD-455.3) Hx of HIATAL HERNIA (ICD-553.3) Hx of GERD, SEVERE (ICD-530.81) Hx of GASTROPARESIS (ICD-536.3) ABDOMINAL BLOATING (ICD-787.3) NAUSEA (ICD-787.02) June 2008:  Esophagogastroduodenoscopy:  Distal esophageal erosions   consistent with erosive reflux esophagitis, Schatzki's ring, status post   dilation disruption as described above, otherwise normal esophagus.   Small hiatal hernia, linear antral erosions probably secondary to   Aggrenox, otherwise normal gastric mucosa, patent pylorus, normal D-1   and D-2.     Colonoscopy findings:  External hemorrhoids, otherwise normal rectum and   colon.   Review of Systems General:  Denies fever, chills, and anorexia. Eyes:  Denies blurring, irritation, and discharge. ENT:  Denies sore throat, hoarseness, and difficulty swallowing. CV:  Denies chest pains and dyspnea on exertion. Resp:  Denies dyspnea at rest and wheezing. GI:  Complains of nausea, vomiting, and abdominal pain. GU:  Denies urinary burning, blood in urine, and nocturnal urination. MS:  Denies joint pain / LOM, joint swelling, and joint stiffness. Derm:   Denies rash, itching, and dry skin. Neuro:  Denies weakness and syncope. Psych:  Denies depression and anxiety. Endo:  Denies cold intolerance and heat intolerance.  Vital Signs:  Patient profile:   65 year old female Height:      63 inches Weight:      158 pounds BMI:     28.09 Temp:     98.6 degrees F oral Pulse rate:   84 / minute BP sitting:   120 / 84  (left arm) Cuff size:   regular  Vitals Entered By: Hendricks Limes LPN (March 12, 2010 2:39 PM)  Physical Exam  General:  Well developed, well nourished, no acute distress. Head:  Normocephalic and atraumatic. Mouth:  No deformity or lesions, dentition normal. Lungs:  Clear throughout to auscultation. Heart:  Regular rate and rhythm; no murmurs, rubs,  or bruits. Abdomen:  normal bowel sounds, without guarding, without rebound, no distesion, no masses, and no hepatomegally or splenomegaly.   Msk:  Symmetrical with no gross deformities. Normal posture. Extremities:  No clubbing, cyanosis, edema or deformities noted. Neurologic:  Alert and  oriented x4;  grossly normal neurologically. Psych:  Alert and cooperative. Normal mood and affect.  Impression & Recommendations:  Problem # 1:  Hx of GASTROPARESIS (ICD-58.12) 65 year old female with chronic N/V, gastroparesis in setting of chronic narcotics. Reports continued N/V despite Domperidone. New onset of LUQ and epigastric pain, worsened with eating, on twice/day Nexium. Last EGD 2008. See HPI.   Continue Nexium Will discuss increasing domperidone vs adding erythromycin with Dr. Jena Gauss Avoid phenergan secondary to past effects. Asked pt to stop taking and trial of Zofran prescribed Referral to [facility] for further options ?pacer?  Consider upper endoscopy due to recent onsent of epigastric pain, continued N/V, last in 2008.   Problem # 2:  ABDOMINAL PAIN, EPIGASTRIC (ICD-789.06)  see #1.   Orders: Est. Patient Level III (45409) Prescriptions: ZOFRAN 4 MG TABS  (ONDANSETRON HCL) take 1 to 2 tabs by mouth every 8 hours as needed for nausea  #120 x 0   Entered and Authorized by:   Gerrit Halls NP   Signed by:   Gerrit Halls NP on 03/12/2010   Method used:   [facility] ...       [facility]  [place] [address] ([facility])       [address]       [place], [place]  [address]       Ph: [phone] or [phone]       Fax: [phone]   RxID:   [id number]   Appended Document: RE-ASSESSED IN OV NEXT WK BY EXTENDER PER RMR/SS Spoke with Dr. Jena Gauss about pt and medication management. Considered adding erythromycin. Spoke with pt today who states she is doing considerably better with n/v. taking zofran. will hold off on erythromycin for now, as pt has extensive med list which includes a statin. follow-up with pt in a few weeks to see how she is doing; if not better, may need to proceed with EGD to assess intermittent LUQ/epigastric pain.  Appended Document: RE-ASSESSED IN OV NEXT WK BY EXTENDER PER RMR/SS Pt has been taking off statin, no contraindication to begin erythromycin. will send to pharmacy of choice. instructed on administration. stated  understanding.

## 2010-05-23 NOTE — Progress Notes (Signed)
Summary: phone note/ alot of nausea  Phone Note Call from Patient   Caller: Patient Summary of Call: Pt called and is having alot of nausea with some vomiting. Said the Zofran is not helping. She has appt at [facility] on 06/10/2010,  but said she just doesn't feel that she can make it without something to help nausea for that long. Please advise! Initial call taken by: Cloria Spring LPN,  May 01, 2010 4:40 PM     Appended Document: phone note/ alot of nausea Is she having any epigastric pain? We had talked about doing EGD if not better.   Appended Document: phone note/ alot of nausea LATE ENTRY...called pt back yesterday at 4:50 pm. She said she is having epigastric pain. And having it most every day. Per Tracey Halls, NP, told pt Tracey Mccullough suggest going ahead and do  the EGD. She said OK to make appt. 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  Appended Document: phone note/ alot of nausea Appt time is 8:15 on  05/14/2010.  Appended Document: phone note/ alot of nausea Pt rescheduled to 05/10/2010 @ 9:15. Wayne in Endo aware.

## 2010-05-23 NOTE — Letter (Signed)
Summary: EGD/[facility]  EGD/[facility]   Imported By: Rexene Alberts 05/02/2010 09:52:15  _____________________________________________________________________  External Attachment:    Type:   Image     Comment:   External Document  Appended Document: EGD/[facility] aggrenox ok; please schedule  Appended Document: EGD/[facility] Pt scheduled for 05/10/2010 @ 9:15 AM.

## 2010-05-24 NOTE — Letter (Signed)
Summary: INSTRUCTIONS FOR EGD  INSTRUCTIONS FOR EGD   Imported By: Rexene Alberts 05/03/2010 10:32:50  _____________________________________________________________________  External Attachment:    Type:   Image     Comment:   External Document

## 2010-05-24 NOTE — Letter (Signed)
Summary: Internal Other/ [place] Rx Order  Internal Other/ [place] Rx Order   Imported By: Cloria Spring LPN 16/01/9603 54:09:81  _____________________________________________________________________  External Attachment:    Type:   Image     Comment:   External Document

## 2010-06-13 ENCOUNTER — Telehealth (INDEPENDENT_AMBULATORY_CARE_PROVIDER_SITE_OTHER): Payer: Self-pay

## 2010-06-13 ENCOUNTER — Encounter: Payer: Self-pay | Admitting: Gastroenterology

## 2010-06-14 ENCOUNTER — Encounter: Payer: Self-pay | Admitting: Gastroenterology

## 2010-06-18 ENCOUNTER — Telehealth (INDEPENDENT_AMBULATORY_CARE_PROVIDER_SITE_OTHER): Payer: Self-pay

## 2010-06-18 NOTE — Medication Information (Signed)
Summary: PROMETHAZINE 25MG   PROMETHAZINE 25MG    Imported By: Rexene Alberts 06/13/2010 16:18:23  _____________________________________________________________________  External Attachment:    Type:   Image     Comment:   External Document  Appended Document: PROMETHAZINE 25MG  pt had adverse side effects with this in past. (tardive dyskinesia). use zofran.   Appended Document: PROMETHAZINE 25MG  pt and pharmacy aware

## 2010-06-18 NOTE — Progress Notes (Signed)
Summary: nausea, vomiting  Phone Note Call from Patient Call back at Home Phone [phone]   Caller: Patient Summary of Call: pt called- she has been out of her domperidone x 2 weeks, didnt have the money to get more at the time, she also fell and hurt her leg and couldnt leave the house. pt went off her gastroparesis diet because she got hungry. pt is still taking zofran prn  and  Erypred ac & hs, but is having a hard time keeping it down without vomiting. now c/o cramping, vomiting, and abd pain that she rates as a 10. pt also had diarrhea Mon- Wed, none today. She stopped her miralax on Monday. She has been on clear liquids for 3 days. She is only taking her hydrocodone once daily. pt stated she didnt have anyone to get her to her appts. tried to call pt back to see if she went to [facility] appt on Monday and phone number in chart has been disconnected. pt needs new rx for domperidone and I told her I would mail it to her with the [place] form. pt wants to know what she can do about the vomiting and abd pain. She also requested suppositories for nausea/vomiting called in to [facility] [place]. please advise Initial call taken by: Hendricks Limes LPN,  June 13, 2010 12:00 PM     Appended Document: nausea, vomiting pt has hx of chronic abdominal pain, gastroparesis. recent EGD with Schatzki's ring, s/p dilatation. likely increasing N/V due to cessation of domperidone. I do not feel comfortable prescribing phenergan suppositories due to hx of tardive dyskinesia. Let's do the following:  1. avoid carbonated beverages, continue liquids, small amounts at a time 2. zofran for nausea 3. nexium twice/day 4. restart domperidone when able.Marland Kitchenalways call office before it runs out 5. to ED if unable to tolerate liquids, feels dehydrated, worsening abdominal pain  Appended Document: nausea, vomiting pt aware and verbalized understanding

## 2010-06-27 NOTE — Progress Notes (Signed)
Summary: domperidone rx  Phone Note Call from Patient Call back at Home Phone [phone]   Caller: Patient Summary of Call: pt needs new Rx written for Domperidone to send to [place] [facility].  Initial call taken by: Hendricks Limes LPN,  June 18, 2010 4:17 PM

## 2010-06-27 NOTE — Medication Information (Signed)
Summary: PROMETHAZINE 25MG   PROMETHAZINE 25MG    Imported By: Rexene Alberts 06/14/2010 12:25:30  _____________________________________________________________________  External Attachment:    Type:   Image     Comment:   External Document  Appended Document: PROMETHAZINE 25MG  see append from 2/23.

## 2010-07-02 ENCOUNTER — Encounter: Payer: Self-pay | Admitting: Internal Medicine

## 2010-07-05 ENCOUNTER — Encounter: Payer: Self-pay | Admitting: Urgent Care

## 2010-07-07 LAB — CBC
HCT: 43.9 % (ref 36.0–46.0)
Hemoglobin: 15.1 g/dL — ABNORMAL HIGH (ref 12.0–15.0)
MCHC: 34.5 g/dL (ref 30.0–36.0)
MCV: 94.7 fL (ref 78.0–100.0)
RBC: 4.63 MIL/uL (ref 3.87–5.11)

## 2010-07-07 LAB — APTT: aPTT: 33 seconds (ref 24–37)

## 2010-07-07 LAB — DIFFERENTIAL
Basophils Relative: 1 % (ref 0–1)
Eosinophils Relative: 2 % (ref 0–5)
Monocytes Absolute: 0.5 10*3/uL (ref 0.1–1.0)
Monocytes Relative: 8 % (ref 3–12)
Neutro Abs: 3.9 10*3/uL (ref 1.7–7.7)

## 2010-07-07 LAB — BASIC METABOLIC PANEL
CO2: 31 mEq/L (ref 19–32)
Calcium: 9.8 mg/dL (ref 8.4–10.5)
Chloride: 102 mEq/L (ref 96–112)
GFR calc Af Amer: >60 mL/min (ref >60)
Potassium: 4.8 mEq/L (ref 3.5–5.1)
Sodium: 143 mEq/L (ref 135–145)

## 2010-07-08 ENCOUNTER — Encounter: Payer: Self-pay | Admitting: Gastroenterology

## 2010-07-09 NOTE — Letter (Signed)
Summary: [place] [facility] ORDER FORM  [place] [facility] ORDER FORM   Imported By: Rexene Alberts 07/02/2010 10:38:42  _____________________________________________________________________  External Attachment:    Type:   Image     Comment:   External Document

## 2010-07-18 NOTE — Medication Information (Signed)
Summary: PA for eryped  PA for eryped   Imported By: Hendricks Limes LPN 04/54/0981 19:14:78  _____________________________________________________________________  External Attachment:    Type:   Image     Comment:   External Document

## 2010-07-18 NOTE — Medication Information (Signed)
Summary: ERYPED 200MG   ERYPED 200MG    Imported By: Rexene Alberts 07/05/2010 15:33:27  _____________________________________________________________________  External Attachment:    Type:   Image     Comment:   External Document  Appended Document: ERYPED 200MG  Is pt using ery-ped OR domperidone? If ERY-PED 200mg /23ml, 1 teaspoon ac/hs(QID), #461ml, 3 RF  Appended Document: ERYPED 200MG  LM for pt- rx [facility]. pt stated on voicemail that she hasnt received her domperidone yet

## 2010-07-29 LAB — COMPREHENSIVE METABOLIC PANEL
Alkaline Phosphatase: 88 U/L (ref 39–117)
BUN: 4 mg/dL — ABNORMAL LOW (ref 6–23)
Chloride: 103 mEq/L (ref 96–112)
Glucose, Bld: 113 mg/dL — ABNORMAL HIGH (ref 70–99)
Potassium: 3.6 mEq/L (ref 3.5–5.1)
Total Bilirubin: 0.5 mg/dL (ref 0.3–1.2)
Total Protein: 6.5 g/dL (ref 6.0–8.3)

## 2010-07-29 LAB — URINALYSIS, MICROSCOPIC ONLY
Nitrite: NEGATIVE
Specific Gravity, Urine: 1.01 (ref 1.005–1.030)
Urine-Other: NONE SEEN
Urobilinogen, UA: 0.2 mg/dL (ref 0.0–1.0)

## 2010-07-29 LAB — DIFFERENTIAL
Basophils Absolute: 0 10*3/uL (ref 0.0–0.1)
Basophils Relative: 0 % (ref 0–1)
Neutro Abs: 3.7 10*3/uL (ref 1.7–7.7)
Neutrophils Relative %: 65 % (ref 43–77)

## 2010-07-29 LAB — CBC
HCT: 35.3 % — ABNORMAL LOW (ref 36.0–46.0)
Hemoglobin: 12.8 g/dL (ref 12.0–15.0)
RDW: 12.5 % (ref 11.5–15.5)

## 2010-08-01 LAB — COMPREHENSIVE METABOLIC PANEL
AST: 19 U/L (ref 0–37)
Albumin: 4.4 g/dL (ref 3.5–5.2)
Chloride: 100 mEq/L (ref 96–112)
Creatinine, Ser: 0.67 mg/dL (ref 0.4–1.2)
GFR calc Af Amer: >60 mL/min (ref >60)
Potassium: 3.6 mEq/L (ref 3.5–5.1)
Total Bilirubin: 0.5 mg/dL (ref 0.3–1.2)

## 2010-08-01 LAB — URINALYSIS, ROUTINE W REFLEX MICROSCOPIC
Bilirubin Urine: NEGATIVE
Glucose, UA: NEGATIVE mg/dL
Hgb urine dipstick: NEGATIVE
Ketones, ur: NEGATIVE mg/dL
Nitrite: NEGATIVE
pH: 5 (ref 5.0–8.0)

## 2010-08-01 LAB — DIFFERENTIAL
Basophils Absolute: 0 10*3/uL (ref 0.0–0.1)
Eosinophils Relative: 2 % (ref 0–5)
Lymphocytes Relative: 29 % (ref 12–46)
Monocytes Absolute: 0.4 10*3/uL (ref 0.1–1.0)
Monocytes Relative: 6 % (ref 3–12)

## 2010-08-01 LAB — CBC
MCV: 94.9 fL (ref 78.0–100.0)
Platelets: 202 10*3/uL (ref 150–400)
WBC: 6.8 10*3/uL (ref 4.0–10.5)

## 2010-08-15 ENCOUNTER — Ambulatory Visit: Payer: Self-pay | Admitting: Urgent Care

## 2010-09-03 NOTE — Assessment & Plan Note (Signed)
NAMEJANAUTICA, [name]                CHART#:  [id number]   DATE:  06/18/2007                       DOB:  Sep 13, 1945   CHIEF COMPLAINT:  Follow-up IBS/gastroparesis.   SUBJECTIVE:  [name] is a 65 year old female.  She has longstanding  history of alternating IBS with constipation and diarrhea.  She also has  recently been diagnosed with gastroparesis.  She has a gastric emptying  study 03/05/07 which was markedly abnormal with decreased emptying at 2  hours, only 55% emptied.  She was placed on Reglan 5 mg q.i.d.  However,  she admits to only taking it b.i.d.  She is having chronic nausea and  vomiting pretty much all day long.  She complains of some abdominal  bloating as well.  She has been off her PPI for about 2 weeks as she ran  out.  She has been having to take Phenergan p.r.n. for nausea as well.  Her weight is up 6 pounds in the last 7 months.  She denies any fever or  chills.  Denies any rectal bleeding or melena.  She has had an extensive  workup thus far which has included a negative head CT 03/01/07 which  shows stable chronic small vessel white matter changes in both cerebral  hemispheres. She had negative CBC, LFT's and met 7 on 02/26/07. She also  had a normal TSH. She had a negative CT of the abdomen and pelvis  without contrast on 05/06/06. She had a EGD and colonoscopy with Dr.  Jena Gauss on 09/30/06.  She was found to have distal esophageal erosion  consistent with erosive reflux esophagitis and Schatzki's  which was  dilated with a 54 [place] Maloney dilator.  She had a small hiatal hernia  and antral erosions felt to be due to Aggrenox.  She had external  hemorrhoids and an otherwise normal colonoscopy.  She has had a negative  celiac antibody previously back in 2004.   CURRENT MEDICATIONS:  See list from 06/18/07.   ALLERGIES:  Penicillin.   OBJECTIVE:  VITAL SIGNS:  Weight 156 pounds, height 63-1/2 inches, temp  98.3.  Blood pressure 130/82, pulse 72.  GENERAL:   She is a well-developed, well-nourished Caucasian female, no  acute distress.  HEENT:  Pupils equal, sclera nonicteric, pink conjunctivae. Oropharynx  pink and moist without any lesions.  NECK:  Supple without mass or thyromegaly.  CHEST:  Regular rate and rhythm, normal S1/S2 without murmurs, clicks,  rubs or gallops.  ABDOMEN:  Positive bowel sounds x4.  No bruits auscultated, soft,  nontender, nondistended without palpable mass or hepatosplenomegaly.  No  rebound tenderness or guarding.  EXTREMITIES:  Without clubbing or edema.   ASSESSMENT:  1. [name] is a 65 year old female with gastroparesis.  We may need      to increase her Reglan dose.  Placed her on a gastroparesis diet      and see how she does.  2. She has history of complicated GERD.  Has been off PPI for      approximately 2 weeks.  I am sure worsening her symptoms.  3. She has alternating constipation and diarrhea felt to be mixed IBS.   PLAN:  1. Omeprazole 20 mg b.i.d. #30 with 5 refills.  2. Begin FloraQ or Align once daily.  3. Increase Reglan to 5  mg AC and HS (q.i.d.) #120 with 5 refills.  4. Gastroparesis literature and IBS literature given for her review.  5. She is going to begin fiber supplement of choice.  6. Office visit in 6 months with Dr. Jena Gauss or sooner if she has      further problems.       Lorenza Burton, N.P.  Electronically Signed     R. Roetta Sessions, M.D.  Electronically Signed    KJ/MEDQ  D:  06/18/2007  T:  06/19/2007  Job:  [id number]   cc:   Ramon Dredge L. Juanetta Gosling, M.D.

## 2010-09-03 NOTE — Assessment & Plan Note (Signed)
NAMEEDITHE, DOBBIN                CHART#:  [id number]   DATE:  06/21/2008                       DOB:  1945-09-01   Followup for gastroparesis, gastroesophageal reflux disease,  constipation, history of diarrhea and predominant irritable bowel  syndrome.  Last seen October 21, 2007.   Ms. [name] basically has had worsening problems with nausea, and she has  been having some gas/bloat symptoms which cefoxitin seemed to have  helped (10-day course back in July).  She may go 4 days without a bowel  movement.  She is also nauseated all the time and intermittently has  vomiting episodes; no real out and out , abdominal pain.  She is taking  metoclopramide 5 mg a.c. and h.s.  She has noted some muscle twitches at  night.  She does take hydrocodone for arthritic discomfort and  promethazine as well.   CURRENT MEDICATIONS:  See updated list.   ALLERGIES:  PENICILLIN.   EXAM TODAY:  Weight stable at 168, height 5 foot 3.5 inches, temperature  97.6, BP 138/80, pulse 88.  SKIN:  Warm and dry.  HEENT:  No scleral icterus.  CHEST:  Lungs are clear to auscultation.  HEART:  Regular rate and rhythm without murmur, gallop or rub.  ABDOMEN:  Nondistended.  Positive bowel sounds.  No succussion splash.  Abdomen soft and nontender.  EXTREMITIES:  Have no edema.   ASSESSMENT:  Nausea and intermittent vomiting, most likely secondary to  gastroparesis.  She is having some muscle twitches that could be related  to metoclopramide.  Gastroesophageal reflux disease, stable.  Constipation now, but has history of diarrhea predominant irritable  bowel syndrome.   RECOMMENDATIONS:  1. Stop metoclopramide.  2. Begin domperidone 10 mg a.c. and h.s.  I gave her prescription and      gave her the  information on how she can contact the pharmacy in      [place].  We reviewed the improved safety profile with domperidone      and admonished her about potential for breast tenderness.  3. For the time being,  increase Capadex to 60 mg orally twice daily      before supper and breakfast.  4. Begin MiraLax 17 grams orally at bedtime p.r.n. constipation.      Would like to facilitate at least 3 bowel movements weekly.  5. Plan to see this nice lady back in 6 weeks.  Hopefully we can back      off on her Capodex to once daily, once she gets domperidone on      board.       Jonathon Bellows, M.D.  Electronically Signed     RMR/MEDQ  D:  06/21/2008  T:  06/21/2008  Job:  [id number]   cc:   Ramon Dredge L. Juanetta Gosling, M.D.

## 2010-09-03 NOTE — Assessment & Plan Note (Signed)
Tracey Mccullough, Tracey Mccullough                CHART#:  [id number]   DATE:  11/13/2006                       DOB:  06/01/1945   CHIEF COMPLAINT:  Follow up EGD and colonoscopy.   HISTORY OF THE PRESENT ILLNESS:  This patient is a 65 year old female.  She has a history of GERD, which was previously untreated.  She had  intermittent esophageal dysphagia as well as alternating constipation  and diarrhea as well as intermittent rectal bleeding. She was found have  distal esophageal erosions consistent with erosive remote esophagitis of  Schatzki's ring, which was dilated, a small hiatal hernia, and linear  antral erosion probably were secondary to her Aggrenox, but otherwise  she had a normal EGD.  On colonoscopy she was found to have external  hemorrhoids.  She was started on Prevacid 30 mg daily; however, she  tells me her insurance will not pay for this medication.  It did seem to  help her GERD symptoms.  She denies any dysphagia or odynophagia at this  time.  She continues to have alternating diarrhea and constipation.  She  can have three to four loose stools a day and then go for a couple days  without a bowel movement.  She complains of significant bloating,  usually postprandially between 15-30 minutes; this has been a chronic  problem  for her.   MEDICATIONS:  The patient is on Aggrenox due a her CVA some six year  ago.  Current medications are; see the list from November 13, 2006.   ALLERGIES:  PENICILLIN.   OBJECTIVE:  VITAL SIGNS:  Weight 150 pounds.  Height 63.5 inches.  Blood  pressure 150/90 and pulse 80.  GENERAL APPEARANCE:  The patient is a well-developed, well-nourished  female in no acute distress.  HEENT:  The pupils are very clear.  Nonicteric sclerae.  Oropharynx pink  and moist without any lesions.  CHEST:  The chest is clear.  HEART:  The heart has a regular rate and rhythm. Normal S1 and S2.  ABDOMEN:  The abdomen has positive bowel sounds times four.  No bruits  auscultated.  Soft, nontender and nondistended.  There are no palpable  masses or hepatosplenomegaly.  There is no guarding.  EXTREMITIES:  The extremities are without clubbing or edema.  SKIN:  The skin is pink, warm and dry without any rashes or jaundice.   ASSESSMENT:  The patient is a 65 year old female with;  1. Gastroesophageal reflux disease/erosive reflux esophagitis.  She      had good response to a proton pump inhibitor, but she is unable to      afford this due to her insurance.  We will change Aciphex to      omeprazole.  2. As far as her alternating diarrhea and constipation this patient is      concerned as she has typical irritable bowel symptoms in light of a      normal colonoscopy.   PLAN:  1. IBS literature was given to her for review and the chronic nature      was discussed with her.  2. Begin fiber supplementation in the way of either Fiber choice of      Benefiber.  3. I have given her a few Imodium samples.  She is not a candidate of  antispasmodics given her glaucoma.  She can take an occasional      Imodium for her diarrheal symptoms.  4. Either ALign  or Flora Q once daily for bloating.       Lorenza Burton, N.P.  Electronically Signed     R. Roetta Sessions, M.D.  Electronically Signed    KJ/MEDQ  D:  11/13/2006  T:  11/14/2006  Job:  [id number]   cc:   Ramon Dredge L. Juanetta Gosling, M.D.

## 2010-09-03 NOTE — Op Note (Signed)
Tracey Mccullough, Tracey Mccullough               ACCOUNT NO.:  [id number]   MEDICAL RECORD NO.:  [id number]          PATIENT TYPE:  AMB   LOCATION:  DAY                           FACILITY:  [facility]   PHYSICIAN:  R. Roetta Sessions, M.D. DATE OF BIRTH:  09/11/1945   DATE OF PROCEDURE:  09/30/2006  DATE OF DISCHARGE:                               OPERATIVE REPORT   PROCEDURE:  EGD with Tracey Mccullough dilation followed by colonoscopy,  diagnostic.   INDICATIONS FOR PROCEDURE:  A 65 year old lady with a history of  gastroesophageal reflux disease previously untreated, reports  intermittent esophageal dysphagia, alternating constipation and diarrhea  and intermittent blood per rectum in a setting of marked positive family  history of colon cancer.  EGD is now being done.  Potential for  esophageal dilation has been reviewed.  Colonoscopy is also being done.  This approach has been discussed with the patient at length.  Potential  risks, benefits and alternatives have been reviewed, questions answered  and she is agreeable .  Please see documentation on the medical record.   PROCEDURE NOTE:  O2 saturation, blood pressure, pulse and respirations  were monitored throughout the entirety of both procedures.   CONSCIOUS SEDATION:  Versed 8 mg IV, Demerol 100 mg IV in divided doses.  Cetacaine spray for topical pharyngeal anesthesia.   INSTRUMENTATION:  Pentax video chip system.   FINDINGS:  EGD examination of tubular esophagus revealed a subtle  Schatzki's ring.  There was also distal circumferential esophageal  erosions, no Barrett's esophagus.  No evidence of tumor.  EG junction  easily traversed with the scope.   Stomach:  Gastric cavity was emptied and insufflated well with air.  Thorough examination of the gastric mucosa including retroflexion to  view the proximal stomach and esophagogastric junction demonstrated only  a small hiatal hernia and some linear antral erosions.  The pylorus was  patent and  easily traversed.  Examination of the bulb and second portion  revealed no abnormalities.   THERAPEUTIC/DIAGNOSTIC MANEUVERS PERFORMED:  A 54-[place] Maloney dilator  was passed to full insertion.  A look-back revealed the ring had been  ruptured without apparent complication.  The patient tolerated the  procedure well and was prepared for colonoscopy.  Digital rectal exam  revealed only some external hemorrhoids.   ENDOSCOPIC FINDINGS:  The prep was good.  Examination of the colonic  mucosa was undertaken from the rectosigmoid junction through the left,  transverse and right colon, to the area of the appendiceal orifice,  ileocecal valve and cecum.  These structures were well seen  and  photographed for the record.  From this level the scope was slowly  withdrawn.  All previously mentioned mucosal surfaces were again seen.  The colonic mucosa appeared normal.  Scope was pulled down in the rectum  where thorough examination of the rectal mucosa including retroflexed  view of the anal verge demonstrated no abnormalities.   THERAPEUTIC/DIAGNOSTIC MANEUVERS PERFORMED:  None.   The patient tolerated both procedures well as reactive to endoscopy.   IMPRESSION:  Esophagogastroduodenoscopy:  Distal esophageal erosions  consistent with erosive reflux esophagitis,  Schatzki's ring, status post  dilation disruption as described above, otherwise normal esophagus.  Small hiatal hernia, linear antral erosions probably secondary to  Aggrenox, otherwise normal gastric mucosa, patent pylorus, normal D-1  and D-2.   Colonoscopy findings:  External hemorrhoids, otherwise normal rectum and  colon.   RECOMMENDATIONS:  1. [name] has already showed significant improvement with Prevacid      30 mg orally daily started through my office, will continue that      regimen.  Antireflux literature provided to Tracey Mccullough.  This      should also be a guide to either protect her stomach with      concomitant  Aggrenox.  2. Begin Benefiber 1 tablespoon daily.  3. A 10-day course of Anusol HC suppositories one per rectum at      bedtime.  4. Will add Align as a probiotic supplement one capsule daily.   Plan to see this nice lady back in the office in six weeks for recheck.      Tracey Mccullough, M.D.  Electronically Signed     RMR/MEDQ  D:  09/30/2006  T:  09/30/2006  Job:  [id number]

## 2010-09-03 NOTE — Procedures (Signed)
NAMEQUANDRA, FEDORCHAK               ACCOUNT NO.:  [id number]   MEDICAL RECORD NO.:  [id number]          PATIENT TYPE:  REC   LOCATION:  RAD                           FACILITY:  [facility]   PHYSICIAN:  Gerrit Friends. Dietrich Pates, MD, FACCDATE OF BIRTH:  01-19-1946   DATE OF PROCEDURE:  DATE OF DISCHARGE:                                ECHOCARDIOGRAM   REFERRING:  Dr. Juanetta Gosling and Dr. Eden Emms.   CLINICAL DATA:  A 65 year old woman with dyspnea and chest pain; history  of hypertension.   M-MODE:  Aorta 2.4, left atrium 3.0, septum 1.4, posterior wall 1.2, LV  diastole 4.0, LV systole 3.0.   1. Technically adequate echocardiographic study.  2. Normal left atrium, right atrium and right ventricle.  3. Mild aortic sclerosis of a trileaflet valve.  4. Normal diameter of the proximal ascending aorta; mild calcification      of the wall.  5. Normal mitral, tricuspid and pulmonic valve; normal proximal      pulmonary artery.  6. Normal Doppler study with physiologic tricuspid regurgitation and      normal estimated RV systolic pressure.  7. Normal left ventricular size; borderline hypertrophy; normal      regional and global function.  8. Normal IVC.      Gerrit Friends. Dietrich Pates, MD, [facility]  Electronically Signed     RMR/MEDQ  D:  11/20/2006  T:  11/20/2006  Job:  [id number]

## 2010-09-03 NOTE — H&P (Signed)
[name], Mccullough               ACCOUNT NO.:  [id number]   MEDICAL RECORD NO.:  [id number]          PATIENT TYPE:  AMB   LOCATION:                                FACILITY:  [facility]   PHYSICIAN:  R. Roetta Sessions, M.D. DATE OF BIRTH:  [birth year]   DATE OF ADMISSION:  DATE OF DISCHARGE:  LH                              HISTORY & PHYSICAL   CHIEF COMPLAINT:  Alternating constipation and diarrhea.   [name] is a pleasant 65 year old Caucasian female with  chronic alternating constipation and diarrhea.  She tells me that any  given month she may have 10 days constipation where she hardly has any  bowel movement at all and the other 20 days out of the month she has  abdominal cramps and nonbloody diarrhea, but occasionally does pass  blood per rectum.  Both her brother and sister were diagnosed with  colorectal cancer in their 51s.  I last saw this lady for chronic  diarrhea back in 2004.  She underwent a colonoscopy with ileoscopy  May 02, 2002, by me.  She was found to have internal hemorrhoids and  diminutive polyp at 25 cm, cold biopsy removed.  Everything else looked  good.  Polyp biopsy turned out be hyperplastic.  Random biopsies of the  left colon failed to demonstrate microscopic colitis.  Stool studies  were negative.  Terminal ileum appeared normal.  Fecal fat analysis  demonstrated somewhat elevated fecal fat at 12.9 with 1172 g of stool  collected.  She had variable improvement at that time with an empiric  course of Flagyl for possible bacterial overgrowth.  Celiac antibody  came back negative.  Since I last saw her she has had problems with a  left kidney stone and has had progressive hearing loss.  She continues  be followed by Dr. Juanetta Gosling primarily.   In addition, [name] notes heartburn symptoms all the time.  She  states she may have had an upper endoscopy many, many years ago (we have  not done one).  She also has intermittent esophageal dysphagia  to  solids, is not on acid-suppression therapy.   PAST MEDICAL HISTORY:  1. Depression.  2. Hypertension.  3. History of osteoarthritis.  4. History of CVA 2003.   PAST SURGERIES:  1. Cholecystectomy.  2. Tubal ligation.  3. Benign cyst removed from both axilla3.  4. Urological procedure for kidney stones recently.   CURRENT MEDICATIONS:  1. Lotrel daily.  2. Aggrenox b.i.d.  3. Paxil once daily.  4. Xanax 1 mg daily.  5. Phenergan 25 mg p.r.n. nausea.   ALLERGIES:  PENICILLIN causes a rash.   FAMILY HISTORY:  Brother and sister with colon cancer.   SOCIAL HISTORY:  The patient is now divorced, after being married for  over 40 years.  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  Smokes two packs of  cigarettes per day.  No alcohol use.   REVIEW OF SYSTEMS:  No chest pain, no dyspnea on exertion.  She has  actually lost from 172 pounds in October 2004 down to 158.   EXAMINATION  TODAY:  VITAL SIGNS:  She appears at her baseline weight  158.5, height 5 feet 3.5 inches, temperature 98.7, BP 162/90, pulse 80.  SKIN:  Warm and dry.  There is no jaundice.  HEENT:  No scleral icterus.  Conjunctivae are pink.  oral cavity:  No  lesions.  CHEST:  Lungs clear to auscultation.  CARDIOVASCULAR:  Regular rate and rhythm without murmur, gallop rub.  ABDOMEN:  Nondistended, positive bowel sounds, soft, nontender, without  appreciable mass or organomegaly.  EXTREMITIES:  No edema.  RECTAL:  Deferred until the time of colonoscopy.   ADMITTING IMPRESSION:  [name] is a pleasant 65 year old  lady with alternating constipation and diarrhea.  She has had symptoms  for years, although she had more diarrhea when I saw her back in 2003-  2004.  She has intermittent blood per rectum.  She has a marked positive  family history of colon cancer in two first-degree relatives.   She also has daily reflux symptoms and now reports esophageal dysphagia  to solid food.   RECOMMENDATIONS:  EGD  and colonoscopy in the near future.  Potential for  esophageal dilation reviewed.  Risks, benefits and alternatives of both  upper and lower endoscopy have been reviewed in some detail.  Her  questions were answered.  She is agreeable.  Will make further  recommendations in the very near future.  We will go ahead and start her  on some Prevacid 30 mg capsules one daily #30 from the office in the  interim.      Jonathon Bellows, M.D.  Electronically Signed     RMR/MEDQ  D:  09/18/2006  T:  09/18/2006  Job:  [id number]   cc:   Ramon Dredge L. Juanetta Gosling, M.D.  Fax: [phone]

## 2010-09-03 NOTE — Assessment & Plan Note (Signed)
NAMEEFRAT, ZUIDEMA                CHART#:  [id number]   DATE:  08/12/2007                       DOB:  1945/09/28   OFFICE VISIT:  Follow-up of abdominal bloating, gastroesophageal reflux  disease, diarrhea, predominant irritable bowel syndrome.  Last seen  06/18/2007.  The patient has some gastroparesis based on abnormal GES.  She has been on Reglan 5 mg to 10 mg before meals and nightly.  She has  been taking omeprazole 20 mg orally daily to b.i.d.  Does not work as  well as  Prevacid did previously.  She has diarrhea most the time,  perhaps 4 times, but four days out of the month, she is constipated.  Prior EGD and colonoscopy demonstrated Schatzki's ring, which was  dilated, and erosive reflux esophagitis.  She had a hiatal hernia as  well.  The colonoscopy demonstrated external hemorrhoids.  She has been  on Holiday representative.  She is not diabetic.  She had negative celiac  antibody panel previously, as well as stool studies.  She has tried  to  become more fit and lost 4 pounds recently.  She is not diabetic.   She complains bitterly of abdominal bloating.  She notes her abdomen  distends and gets bigger than it normally is from time to time  throughout the day, particularly after eating.  She denies any severe  localized abdominal pain, just abdominal bloating and generalized  discomfort.  She has a history of a CVA and is taking Aggrenox.   CURRENT MEDICATIONS:  Have been listed.   ALLERGIES:  Penicillin.   PHYSICAL EXAMINATION:  GENERAL:  This lady is a 65 year old lady resting  comfortably.  VITAL SIGNS:  Weight 152, height 5 feet 3-1/2 inches, temperature 91, BP  130/70, pulse 68.  I note a rolling constant chewing motion of her  tongue and oral musculature, which the patient knows she has, states she  has a dry mouth and she has been doing it since she started Reglan.  CHEST:  Lungs are clear to auscultation.  COR:  Regular rate and rhythm, without murmur, gallop,  or rub.  ABDOMEN:  Somewhat obese.  Positive bowel sounds.  No succussion splash.  The abdomen is soft and nontender, without appreciable mass or  organomegaly.   ASSESSMENT:  Gas bloat symptoms in a lady with well documented delayed  gastric emptying and erosive reflux esophagitis.  She has a movement  disorder of her tongue and oral musculature which I am afraid may be  related to Reglan.  She has had a suboptimal response to omeprazole in  lieu of Prevacid.  Findings on recent EGD and colonoscopy are  reassuring.   RECOMMENDATIONS:  1. Although the yield may well be low, will go ahead and continue the      GI evaluation with abdominopelvic CT with IV normal contrast.  2. I have asked her to stop Reglan and never take this medication      again.  She is to continue a gastroparesis diet with multiple small      low-fat meals daily.  Will hold omeprazole and begin Casodex at 60      mg orally daily.  Samples from the office were given.  Further      recommendations to follow the very near future.  Jonathon Bellows, M.D.  Electronically Signed     RMR/MEDQ  D:  08/12/2007  T:  08/12/2007  Job:  [id number]   cc:   Dr. Juanetta Gosling

## 2010-09-03 NOTE — H&P (Signed)
Tracey Mccullough, Tracey Mccullough               ACCOUNT NO.:  [id number]   MEDICAL RECORD NO.:  [id number]          PATIENT TYPE:  AMB   LOCATION:  DAY                           FACILITY:  [facility]   PHYSICIAN:  J. Darreld Mclean, M.D. DATE OF BIRTH:  07-26-1945   DATE OF ADMISSION:  07/25/2008  DATE OF DISCHARGE:  LH                              HISTORY & PHYSICAL   CHIEF COMPLAINT:  Carpal tunnel syndrome, right.   The patient is a 65 year old female with pain and tenderness in her  right hand.  She has had numbness and dropping things for several  months.  She had an EMG done on June 16, 2008, that showed an  abnormal study showing a moderate carpal tunnel syndrome on the left and  right carpal tunnel, severe.  She has more trouble and tenderness with  the right hand than the left hand.  She would like to proceed with right  carpal tunnel release.  I have explained the procedure, the risks and  imponderables of the surgery.  Her hand will get slowly worse over time.  She has tried splinting, rest and pain medicine without help.  She  elects to have the surgery.   PAST SURGERIES:  1. Kidney stone surgery in 2008.  2. Cholecystectomy in 1978.  3. Tubal ligation in 1972.  4. Eye surgery in 2008.  5. She had a cyst removed in 1969.   The patient has a history of:  1. Nerve disorder since 2002.  2. Hypertension.  3. Ulcers.   SHE IS ALLERGIC TO PENICILLIN.   She smokes and does not use alcoholic beverages.  Dr. Juanetta Gosling is her  family doctor.  She is also followed by R. Roetta Sessions, M.D.  The  patient lives in [place] and is divorced.   Her EMGs were done as stated on June 16, 2008, at the offices of  Drs. Denny Levy and [place].   The patient's vital signs were normal.  HEENT:  Negative.  NECK:  Supple.  LUNGS:  Clear to P and A.  HEART: Regular without murmur.  ABDOMEN:  Soft and nontender without masses.  EXTREMITIES:  Got a positive Phalen's, positive [place]  bilaterally with  more pain and tenderness in the medial distribution of the right hand  than the left hand.  CNS:  Intact otherwise.  SKIN:  Intact.   IMPRESSION:  Carpal tunnel syndrome bilaterally, right greater than  left.   PLAN:  Open right carpal tunnel release.  I discussed with the patient  the planned procedure, risks and imponderables.  She appears to  understand and agrees to this procedure as outlined.  Labs are pending.                                            ______________________________  J. Darreld Mclean, M.D.     JWK/MEDQ  D:  07/24/2008  T:  07/24/2008  Job:  [id number]

## 2010-09-03 NOTE — Assessment & Plan Note (Signed)
NAMEGRIZEL, VESELY                CHART#:  [id number]   DATE:  10/21/2007                       DOB:  [birth year]   CHIEF COMPLAINT:  Followup nausea and abdominal bloating.   PROBLEM LIST:  1. Gastroparesis with significant side effects from Reglan.  2. Chronic abdominal bloating and distention.  3. Chronic gastroesophageal reflux disease with prior EGD showing      Schatzki ring, which was dilated, erosive reflux esophagitis, and      hiatal hernia.  4. She has external hemorrhoids on last colonoscopy at the setting of      the same date.  5. Negative celiac antibody panel and full set of stool studies.  6. History of cerebrovascular accident on Aggrenox.  7. History of diarrhea predominant irritable bowel syndrome.  8. Normal CT of the abdomen and pelvis on 08/17/2007, except for tiny      low-attenuation focus of the right kidney too small to      characterize.  9. Status post cholecystectomy.   SUBJECTIVE:  The patient is a 65 year old Caucasian female.  She  complains of significant abdominal bloating, and this has been a chronic  complaint for her.  She describes a constant tightness in her abdomen.  She has alternating bowel movements between diarrhea and constipation.  She can have 3 to 4 stools per day, there are days without bowel  movement.  She has tried gas-bloat diet with minimal relief.  She had a  significant reaction to Reglan.  Therefore it is contraindicated.  She  was having some movement disorders of her tongue and oral musculature.  She is asked never to take this medication again.  She complains of  nausea that last virtually all day long.  Her weight is up 17 pounds  since the last office visit, which was 2 months ago.  She did have a  fall 1 week ago and resulted in the left rib fracture and right forearm  fracture.  She did have a negative head CT at that time, he has not had  any recent lab work done elsewhere denies any alcohol or drug use.  Denies  any fever or chills.  Denies any rectal bleeding or melena.   CURRENT MEDICATIONS:  See the list from 10/21/2007.   ALLERGIES:  Penicillin and Reglan.   OBJECTIVE:  VITAL SIGNS:  Weight 169 pounds, height 53.5 inches,  temperature 97.8, blood pressure 110/64, and pulse 72.  GENERAL:  She is a well-developed, well-nourished Caucasian female in no  acute distress.  HEENT:  Sclerae clear.  Nonicteric.  Conjunctivae pink.  Oropharynx  moist without lesions.  CHEST:  Heart regular rate and rhythm.  Normal S1 and S2 without  murmurs, clicks, rubs, or gallops.  ABDOMEN:  Positive bowel sounds x4.  No bruits auscultated.  Soft,  nontender, and nondistended without palpable mass or hepatosplenomegaly.  No rebound, tenderness, or guarding.  EXTREMITIES:  She has right forearm in the sling.  No clubbing or edema.   ASSESSMENT:  The patient is a 65 year old Caucasian female with history  of gastroesophageal reflux disease and erosive reflux esophagitis, which  is well controlled at this time.  Her main concern is her chronic  abdominal bloating and distention, and chronic nausea, which I suspect  is due to her gastroparesis.  Etiology of her  gastroparesis is unclear.  She is not diabetic.  No known history of thyroid disorder.  She does  not describe any element of depression currently, but does have history  of this.   She does have history of  irritable bowel syndrome, which is usually  diarrhea predominant but has been alternating recently.  She has also  gained 17 pounds in the last 2-3 months.   PLAN:  1. Check TSH, CBC, Met-7 and LFTs.  2. Trial of xifaxin 400 mg t.i.d. for 10 days.  I have given her 2      boxes of samples and prescription for 8 days with no refills.  3. Office visit with Dr. Jena Gauss in 3 months, but we will be contacting      her in the interim with her lab results.  4. Gastroparesis diet was given as well as gas-bloat diet literature.  5. She is to follow up with  her primary care physician regarding her      recent fall and further workup of syncope, which she was previously      seen in the emergency room for.       Lorenza Burton, N.P.  Electronically Signed     R. Roetta Sessions, M.D.  Electronically Signed    KJ/MEDQ  D:  10/21/2007  T:  10/22/2007  Job:  [id number]   cc:   Ramon Dredge L. Juanetta Gosling, M.D.

## 2010-09-03 NOTE — Op Note (Signed)
NAMEJASIME, Tracey Mccullough               ACCOUNT NO.:  [id number]   MEDICAL RECORD NO.:  [id number]          PATIENT TYPE:  AMB   LOCATION:  DAY                           FACILITY:  [facility]   PHYSICIAN:  J. Darreld Mclean, M.D. DATE OF BIRTH:  02-19-1946   DATE OF PROCEDURE:  DATE OF DISCHARGE:                               OPERATIVE REPORT   PREOPERATIVE DIAGNOSIS:  Carpal tunnel syndrome, left.   POSTOPERATIVE DIAGNOSIS:  Carpal tunnel syndrome, left.   PROCEDURE:  Carpal tunnel release in the left with release of the volar  carpal ligament; saline neurolysis; aponeurotomy, left median nerve.   ANESTHESIA:  Bier block.  Please see anesthesia record for tourniquet  time.   SURGEON:  J. Darreld Mclean, MD   DRAINS:  None.   No splint.   INDICATIONS:  The patient is a 65 year old female with pain and  tenderness of the left wrist.  He has previously carpal tunnel release  on the right.  She has night pain, positive Phalen's, positive Tinel's  on the left.  She had positive AMGs.  She has done well on the right  carpal tunnel release, now she desires left carpal tunnel release.  Several treatment has been unsuccessful.   The patient was seen in the holding area and the left hand was  identified as the correct surgical site.  She placed a mark on the left  wrist.  I placed a mark on the left wrist.  She was brought to the  operating room.  She was placed supine.  Bier block anesthesia was  given.  She was prepped and draped in the usual manner.  At a  generalized time-out, identified the patient as Tracey Mccullough and we were  doing the left wrist carpal tunnel release.  All instrumentation deemed  to be properly positioned and working and no one in the operating room  knew each other.  Outline for incision was made.  Careful dissection of  the median nerve was identified proximally.  Vessel loop placed around  the nerve.  A groove director placed in the carpal tunnel space.  The  volar  carpal ligament was then incised, was tight, and nerve was  obviously compressed.  Saline neurolysis and aponeurotomy carried out.  Retinaculum cut proximally.  Specimen of volar carpal ligament was sent  to Pathology.  The wounds were then reapproximated using 3-0 nylon in  interrupted vertical mattress manner.  Sterile dressing applied, bulky  dressing applied, and sheet cotton  applied.  Sheet cotton cut dorsally.  Ace bandage applied loosely.  The  patient tolerated the procedure well.  She was given a prescription for  Lorcet 10 for pain.  Instructions to keep it dry.  I will see her in the  office next week.  If any difficulty, contact me through office hospital  beeper system.           ______________________________  Shela Commons. Darreld Mclean, M.D.     JWK/MEDQ  D:  09/26/2008  T:  09/26/2008  Job:  [id number]

## 2010-09-03 NOTE — Assessment & Plan Note (Signed)
Tracey Mccullough, Tracey Mccullough                CHART#:  [id number]   DATE:  02/26/2007                       DOB:  Mar 11, 1946   CHIEF COMPLAINT:  Chronic nausea and vomiting for 2 months.   SUBJECTIVE:  [name] is a 65 year old Caucasian female who we have  seen previously for chronic GERD and erosive reflux esophagitis as well  as IBS. She tells me about 2 months ago she developed severe nausea, she  has nausea upon awakening every single morning.  She also notes some  dizziness but denies any weakness.  She has been taking Lortab for  chronic arthritis.  She tells me she has tried Phenergan which does seem  to help some with the nausea but not completely.  Even when she does not  take the Lortab she continues to have nausea.  She also complains of  vomiting at least once a day anytime she drinks anything in the morning,  she has problems.  She also is having headaches, she has a knife like  pain in her left temple as well as frontal area at times.  She is status  post a CVA in 2002.  She does have some occasional epigastric burning  and tenderness.  She has irregular bowel movements which alternate  between constipation and diarrhea and this is her baseline.  She denies  any fevers. She has noted some chills.  She denies any urinary symptoms.  Denies any weakness or paresthesias. Denies any rectal bleeding or  melena.  She did have an EGD and colonoscopy by Dr. Jena Gauss on September 30, 2006, she was found to have distal esophageal erosions consistent with  erosive reflux esophagitis and a Schatzki's ring which was dilated with  a 54 [place] dilator.  She had a small hiatal hernia, antral  erosions felt to be secondary to Aggrenox and otherwise normal mucosa.  She had external hemorrhoids and a normal colonoscopy.   CURRENT MEDICATIONS:  See updated list from February 26, 2007.   ALLERGIES:  PENICILLIN.   OBJECTIVE:  VITAL SIGNS:  Weight 150 pounds, height 63-1/2 inches,  temperature  98.1, blood pressure 112/68 and pulse 88.  GENERAL:  [name] is a well-developed, well-nourished Caucasian  female in no acute distress.  HEENT:  Clear, anicteric, conjunctivae pink.  Oropharynx pink and moist  without lesions.  HEART:  Regular rhythm, normal S1, S2 without murmurs, thrills, clicks,  rubs or gallops.  ABDOMEN:  Positive bowel sounds x4.  No bruits auscultated. Soft,  nontender, nondistended without palpable mass or hepatosplenomegaly. No  rebound tenderness or guarding.  EXTREMITIES:  Without clubbing or edema bilaterally.   IMPRESSION:  [name] is a 65 year old Caucasian female with a 8-month  history of chronic nausea and vomiting.  She also has a history of  complicated GERD, is on long-term Aggrenox.  More concerning is her  history of daily headaches and CVA.  I feel it is most important we rule  out central etiology initially, given her history of CVA.  Other  possibilities include peptic ulcer disease or pain medication induced  gastroparesis. Symptoms are not typical of small bowel obstruction.   She has irritable bowel syndrome which is stable.   PLAN:  1. Increase Omeprazole 20 mg b.i.d.  2. Head CT as soon as possible. She is instructed  to go to the      emergency room in the interim if she develops severe headache or      persistent nausea and vomiting.  3. Phenergan 12.5 mg one p.o. q.6-8 hours p.r.n. nausea and vomiting      #10 with no refills.  4. Hemoccult stools x3.  5. CBC, MET-7, TSH and LFTs today.  6. Pending there is no evidence of GI bleeding, would consider gastric      emptying study if above workup is benign.       Lorenza Burton, N.P.  Electronically Signed     R. Roetta Sessions, M.D.  Electronically Signed    KJ/MEDQ  D:  02/26/2007  T:  02/26/2007  Job:  [id number]

## 2010-09-03 NOTE — Op Note (Signed)
NAMESHAMILA, [name]               ACCOUNT NO.:  [id number]   MEDICAL RECORD NO.:  [id number]          PATIENT TYPE:  AMB   LOCATION:  DAY                           FACILITY:  [facility]   PHYSICIAN:  J. Darreld Mclean, M.D. DATE OF BIRTH:  11-30-1945   DATE OF PROCEDURE:  DATE OF DISCHARGE:                               OPERATIVE REPORT   PREOPERATIVE DIAGNOSES:  Carpal tunnel syndrome on the right, small  ganglion cyst, right distal ulna.   POSTOPERATIVE DIAGNOSES:  Carpal tunnel syndrome on the right with  synovitis of the flexor carpi radialis tendon over the distal ulna.   PROCEDURES:  Open carpal tunnel release on the right; saline neurolysis  and aponeurotomy, right median nerve; exploration of area of the distal  ulna; opening of the flexor carpi ulnaris tendon sheath.   ANESTHESIA:  Bier block, supplemental 1% Xylocaine used.   TOURNIQUET TIME:  Please refer to anesthesia record.   SURGEON:  J. Darreld Mclean, MD   INDICATIONS:  The patient is a 65 year old female with carpal tunnel  syndrome on the right proven by nerve conduction velocity studies.  She  also had what looked like a small ganglion over the distal ulna, I told  her we would remove that as well.  She has been told the risks and  imponderables of the procedure and she appears to understand and agrees  to that.   The patient was seen in holding area.  The right wrist identified as  correct surgical site.  She placed a mark on, I placed a mark on the  right wrist.  She was brought to the operating room, placed supine and  Bier block anesthesia was given.  We had a universal time-out,  identified the patient, everyone in room knew each other and all  instrumentation being properly positioned.  She had been prepped and  draped in usual position.  Incision made over the volar carpal ligament  area on the wrist proximally and after careful dissection the median  nerve was identified and protected with nerve vessel  loop.  A grooved  director was used and then the volar carpal ligament was incised.  There  was obvious compression of the median nerve.  Specimen of volar carpal  ligament sent to Pathology.  Saline neurolysis aponeurotomy carried out  of the median nerve.  Wound reapproximated using 3-0 nylon interrupted  vertical mattress manner.  Attention directed over to  the swelling over  the distal ulna.  I  thought there was a small ganglion cyst, but on  opening it was apparent there was no cyst there, but there was  significant synovitis over the flexor carpi ulnaris tendon sheath.  I  opened this up.  Some fluid was expressed, but there was really nothing  to send to Pathology, other than just the tissue was rather enlarged.  The wound was then reapproximated using sterile plain  and skin staples.  Sterile dressing applied to both wounds.  Bulky  dressing applied.  Ace bandage applied.  The patient will be given pain  medicine, Tylox.  I will see  her in the office in 1 week.  If any  difficulties, she is to contact me through the office hospital beeper  system.           ______________________________  Shela Commons. Darreld Mclean, M.D.     JWK/MEDQ  D:  07/25/2008  T:  07/25/2008  Job:  [id number]

## 2010-09-03 NOTE — Assessment & Plan Note (Signed)
[facility]                       [facility] OFFICE NOTE   NAME:[name], Tracey Mccullough                      MRN:          [id number]  DATE:11/11/2006                            DOB:          [birth year]    [name] is a 65 year old patient referred by Dr. Juanetta Gosling for further  work-up of chest pain.  The patient has coronary risk factors including  hypertension and smoking.  Cholesterol status is unknown.  The patient  has had some atypical chest pain back in August.  She was actually seen  in the emergency room in [place].  She tells me that they monitored  her and did 2 sets of enzymes and everything checked out okay.  She has  not had a stress test recently.  Since that time, she has had  intermittent pain.  It is non-exertional.  It is sharp.  It can radiate  to the left arm.  It also can feel like a pressure sensation.  She says  it is increasing in intensity and frequency.  There is no associated  diaphoresis.  She does get some shortness of breath.   She does not have nitroglycerin.  She did say that the nitroglycerin  helped when she was in the [place] ER.   In talking to the patient, she has not had any abnormal musculoskeletal  problems.  There has been no recent trauma.  There is no history of DVT  or PE.  There is no history of aortic disease or aneurysms.   The patient does admit to being under a lot of stress.  She is  financially strapped.  She is divorced and seems to have some general  anxiety issues.   REVIEW OF SYSTEMS:  Remarkable for recurrent headaches.  She has had  previous headaches.  Some of these were around the time that she  supposedly had a stroke back 5 years ago.  She has been on Aggrenox  since that time.  There have been no focal signs.  She also has  complained of some nausea.  She does not have any chronic irritable  bowel syndrome.  There has been no nausea or loose stools.  Apparently,  she has seen  Dr. Jena Gauss recently for alternating constipation and  diarrhea.   She was to have an EGD and colonoscopy.  I do not know these results.   MEDICATIONS:  1. Lotrel 10/20.  Apparently, this was recently increased.  2. Aggrenox b.i.d., which she was placed on 5 years ago for a stroke.   PAST MEDICAL HISTORY:  Otherwise remarkable for:  1. Previous kidney stones requiring left posterior lateral surgery      with nephrostomy tubes.  2. Previous CVA with headache and nausea 5 years ago, on chronic      Aggrenox.  3. Questionable irritable bowel syndrome with recent EGD and      colonoscopy.  4. History of headaches.  5. History of depression.   PAST SURGICAL HISTORY:  The patient's only other surgeries have included  her urological nephrostomy tubes.   ALLERGIES:  PENICILLIN.   SOCIAL HISTORY:  The patient is divorced for the last 3 years.  She has  3 older children.  One lives in [place] that she talks with most of the  time.  She tries to walk on a regular basis.  She is currently not  working, and apparently was advised to apply for disability.  She smokes  2 packs a day and does not drink.   FAMILY HISTORY:  Noncontributory.   PHYSICAL EXAMINATION:  GENERAL:  A well-tanned middle-aged white female  in no distress.  Affect is appropriate.  VITAL SIGNS:  Blood pressure is 130/60, pulse is 78 and regular, she is  afebrile, respiratory rate is 14.  HEENT:  Normal.  NECK:  There are no carotid bruits, no lymphadenopathy, no thyromegaly,  no bruits.  LUNGS:  Clear with good diaphragmatic motion.  No wheezing.  HEART:  There is an S1 and S2 with normal heart sounds.  PMI is normal.  ABDOMEN:  Benign.  Bowel sounds are positive.  No abdominal aortic  aneurysm.  No hepatosplenomegaly or hepatojugular reflux.  No  tenderness.  She does have a scar in the left flank from her nephrostomy  tubes.  Distal pulses are intact.  No edema.  NEUROLOGIC:  Nonfocal.  There is no muscular  weakness.  Her baseline EKG  is totally normal.   I reviewed laboratory work from Dr. Adah Perl office, which is  essentially normal.   IMPRESSION:  1. Chest pain, atypical, increasing in frequency and severity since      August.  Normal EKG (I still have not read it yet).  Follow up      exercise stress Myoview with me in the next week or two.  The      patient cannot have Persantine due to her Aggrenox.  I think she      can walk fine for the test.  2. Hypertension, probably borderline controlled.  This may have      something to do with her headaches.  Lotrel was apparently just      increased.  She will follow up with Dr. Juanetta Gosling for this.  I would      recommend a continued low salt diet and surveillance at home.      Given her headaches, it may be worthwhile to add a beta blocker to      her regimen.  3. History of transient ischemic attack.  She has no residual      deficits.  It sounds like it was a hypertensive stroke.  I am not      sure she should be on long-term Aggrenox.  I think this can      probably be discontinued, so long as she keeps close watch of her      blood pressure.  If her headaches continue, I advised her to follow      up with Dr. Juanetta Gosling regarding possible follow up CT scan.  4. Irritable bowel syndrome with nausea, constipation and diarrhea.      Follow up with Dr. Jena Gauss.  Levsin may be appropriate.  As far as I      can tell, her EGD and colonoscopy do not show any pathology.  5. Anxiety and depression.  The patient may benefit from Prozac.      Again, I will leave this up to Dr. Juanetta Gosling.  6. Dyspnea with atypical chest pain.  I think it is reasonable for the      patient to have a 2-D  echocardiogram to assess right ventricular      and left ventricular function and rule out pulmonary hypertension,      particularly given her long smoking history.  7. Smoking.  The patient is currently not interested in quitting.  I      think she would be a reasonable  candidate for Chantix and/or      Wellbutrin.  Wellbutrin may be a better choice, since she does have      some anxiety and depression.  She will follow up with Dr. Juanetta Gosling      in regards to possibly starting this medicine in the future.  8. As long as her echo and stress test are normal, she will be seen on      a p.r.n. basis.     Noralyn Pick. Eden Emms, MD, [facility]  Electronically Signed    PCN/MedQ  DD: 11/11/2006  DT: 11/12/2006  Job #: [id number]

## 2010-09-06 NOTE — Op Note (Signed)
NAME:  Tracey Mccullough, Tracey Mccullough                       ACCOUNT NO.:  [id number]   MEDICAL RECORD NO.:  [id number]                   PATIENT TYPE:  AMB   LOCATION:  DAY                                  FACILITY:  [facility]   PHYSICIAN:  R. Roetta Sessions, M.D.              DATE OF BIRTH:  04/29/1945   DATE OF PROCEDURE:  05/02/2002  DATE OF DISCHARGE:                                 OPERATIVE REPORT   PROCEDURE:  Colonoscopy with ileostomy, biopsies, and stool sampling.   INDICATIONS FOR PROCEDURE:  The patient is a 65 year old lady with a one-  year history of intermittent diarrhea, some rare episodes of blood per  rectum, and positive family history for colon cancer.  Colonoscopy is now  being done to further evaluate her symptoms and in part for high-risk  screening.  The procedure has been discussed with Ms. Risinger at length  previously and the potential risks, benefits and alternatives have been  reviewed and questions answered.   PROCEDURE NOTE:  O2 saturation, blood pressure, pulse, and respirations were  monitored throughout the entire procedure   CONSCIOUS SEDATION:  Versed and Demerol in incremental doses.   INSTRUMENT:  Olympus video adult colonoscope.   FINDINGS:  Digital rectal exam revealed no abnormalities.   ENDOSCOPIC FINDINGS:  The prep was good.  The rectum and colon were  examined, including a retroflex view of the anal verge which revealed  internal hemorrhoids.   Colon:  The colonic mucosa was surveyed from the rectosigmoid junction to  the left, transverse, and right colon.   Appendiceal orifice, ileocecal valve and cecum:  The patient was noted to  have a 5-mm diminutive polyp at 25 cm.  The remainder of the colonic mucosa  of the sigmoid appeared normal.  The terminal ileum was entered at about 10  cm.  The GI tract appeared normal.  From this, the scope was slowly  withdrawn, all previously mentioned mucosal surfaces were again seen.  The  polyp at 25 cm was  cold biopsied/removed.  Biopsies of the sigmoid and  rectum were taken separately to look for microscopic colitis.  Stool residue  in the colon was aspirated for microbiology studies.  The patient tolerated  the procedure well and was reactive after endoscopy.   IMPRESSION:  1. Internal hemorrhoids, otherwise normal rectum.  2.     Diminutive polyp at 25 cm, cold biopsied/removed.  Remainder of colonic     mucosa and terminal ileum appeared normal.  Several biopsies of the     sigmoid and rectum taken, stool sample taken.   RECOMMENDATIONS:  Follow up on stool studies and biopsies with further  recommendations to follow.  Jonathon Bellows, M.D.    RMR/MEDQ  D:  05/02/2002  T:  05/02/2002  Job:  [id number]   cc:   Dr. Juanetta Gosling

## 2010-09-06 NOTE — Consult Note (Signed)
NAME:  Tracey Mccullough, Tracey Mccullough                         ACCOUNT NO.:  [id number]   MEDICAL RECORD NO.:  [id number]                  PATIENT TYPE:   LOCATION:                                       FACILITY:   PHYSICIAN:  R. Roetta Sessions, M.D.              DATE OF BIRTH:  12-03-1945   DATE OF CONSULTATION:  04/20/2002  DATE OF DISCHARGE:                                   CONSULTATION   PHYSICIAN REQUESTING CONSULTATION:  Oneal Deputy. Juanetta Gosling, M.D.   REASON FOR CONSULTATION:  Diarrhea.   HISTORY OF PRESENT ILLNESS:  The patient is a 65 year old Caucasian female  patient of Dr. Juanetta Gosling who presents today for further evaluation of a one-  year history of diarrhea.  She states for the last one year she has had  intermittent episodes of diarrhea.  Symptoms have gradually been worsening  over the last several months and her diarrhea has become much more constant.  Previously, she had alternating constipation and diarrhea.  She is now  having anywhere up to 15 bowel movements daily.  She has had some  hematochezia on occasion that she feels is due to her hemorrhoids.  She  complains of nocturnal diarrhea as well.  She complains of abdominal cramps,  which seems to worsen with eating and does improve with defecation.  She  complains of nausea but no vomiting.  She has heartburn about two days a  week, for which she takes Prevacid p.r.n.  She denies any fever or chills or  melena.  She has tried Imodium but when she takes this tends to get  constipated, especially when she is taking 2-3 tablets a day.  She was  recently tried on dicyclomine 20 mg t.i.d. but noted no improvement.  Denies  any dysphagia or odynophagia.   CURRENT MEDICATIONS:  1. Lotrel 5/20 mg q.d.  2. Aggrenox 25 mg b.i.d.  3. Paxil 20 mg q.d.  4. Prevacid 30 mg p.r.n.   ALLERGIES:  PENICILLIN causes rash.   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   PAST SURGICAL HISTORY:  Cholecystectomy 29 years ago for gallstones, tubal  ligation, benign cyst removal from bilateral axilla.   FAMILY HISTORY:  Father died of cancer of unknown type but possibly  leukemia.  Brother was diagnosed with colon cancer at age 4 and died two  years later.   SOCIAL HISTORY:  She has been married for 39 years and has three children.  She is a housewife.  She smokes two packs of cigarettes daily.  Denies any  alcohol use.   REVIEW OF SYSTEMS:  Please see HPI for GI.  GENERAL:  Complains of 20-pound  weight loss over the last one year.  Complains of fatigue and weakness.  CARDIOPULMONARY:  Denies chest pain or shortness of breath.  PHYSICAL EXAMINATION:  VITAL SIGNS:  Weight 161.5 pounds, height 5 feet 3-  1/2 inches, temperature 97.9, blood pressure 158/80, pulse 78.  GENERAL:  A pleasant, well-nourished, well-developed Caucasian female in no  acute distress.  SKIN:  Warm and dry.  No jaundice.  HEENT:  Conjunctivae are pink.  Sclerae are nonicteric.  Oropharyngeal  mucosa moist and pink.  No lesions, erythema, or exudate.  No  lymphadenopathy, thyromegaly, or carotid bruits.  CHEST:  Lungs are clear to auscultation.  CARDIAC:  Regular rate and rhythm.  Normal S1, S2.  No murmurs, rubs, or  gallops.  ABDOMEN:  Positive bowel sounds.  Soft, nondistended.  She has mild right  lower quadrant tenderness to deep palpation.  No organomegaly or masses.  RECTAL:  Examination deferred to time of colonoscopy.  EXTREMITIES:  No edema.   IMPRESSION:  The patient is a 65 year old Caucasian female with a one-year  history of gradually worsening diarrhea associated with abdominal cramps,  nausea.  She is having nocturnal episodes of diarrhea and episodes of fecal  incontinence as well.  She has not responded to antispasmodic in the way of  dicyclomine.  She has had episodes of small volume hematochezia.  Family  history is  significant for colon cancer in her brother diagnosed at age 55.  Given gastrointestinal symptoms, have recommended a colonoscopy for further  evaluation.  We need to rule out colon cancer, colonic stricture, colitis,  or even inflammatory bowel disease.  Ultimately, we may be dealing with  irritable bowel syndrome and bleeding from hemorrhoids.  She also has  typical reflux symptoms responsive to Prevacid when she takes it.   PLAN:  1. Colonoscopy in the near future.  I discussed risks, alternatives, and     benefits with the patient and she is agreeable to proceed.  Based on     findings, could consider sending stools for stool studies, possibly     random sigmoid biopsies.  Also, consider TSH.  2. She will take Prevacid 30 mg p.o. q.d. p.r.n. heartburn.  3. Trial of Levbid a half tablet to one tablet p.o. b.i.d. #30 and one     refill.  4. Further recommendations to follow.   I would like to thank Dr. Juanetta Gosling for allowing [place] to take part in the care  of this patient.     Tana Coast, P.AJonathon Bellows, M.D.    LL/MEDQ  D:  04/20/2002  T:  04/20/2002  Job:  [id number]   cc:   Ramon Dredge L. Juanetta Gosling, M.D.  [address]  [place]  [place] [address]  Fax: [phone]

## 2010-09-06 NOTE — H&P (Signed)
Tracey Mccullough, GOYAL               ACCOUNT NO.:  [id number]   MEDICAL RECORD NO.:  [id number]           PATIENT TYPE:  AMB   LOCATION:                                FACILITY:  [facility]   PHYSICIAN:  Ky Barban, M.D.DATE OF BIRTH:  [birth year]   DATE OF ADMISSION:  DATE OF DISCHARGE:  LH                              HISTORY & PHYSICAL   CHIEF COMPLAINT:  Left renal colic.   HISTORY:  A 65 year old female who was referred to me by Dr. Juanetta Gosling.  She has been having severe pain in her left flank on and off for a  while.  A CT scan shows there is a 1.5 cm stone in the left renal  pelvis, so I have seen this patient and advised her that we need to  remove the stone.  Various treatment options were discussed, along with  percutaneous nephrolithotripsy, which I recommended because the stone is  rather large.  She agreed, and she went to [facility].  Percutaneous  nephrostomy tract was established.  Then I had a meeting with the  patient and her family and them about the procedures, limitations,  complications, especially bleeding leading to open surgery, possible  loss of that kidney, use of blood transfusion.  They understand and want  me to go ahead and proceed, so se is coming as an outpatient.  She will  undergo percutaneous nephrolithotomy on the left side under anesthesia,  and I will keep her overnight in the hospital for observation.   MEDICATIONS:  1. Aggrenox.  2. Hydrocodone.  3. Phenergan.   She denies any diabetes or hypertension.   PERSONAL HISTORY:  She smokes 2 packs daily.  Does not drink.   REVIEW OF SYSTEMS:  Denies any chest pain, orthopnea, PND, nausea,  vomiting.   PHYSICAL EXAMINATION:  VITAL SIGNS:  Blood pressure is 130/80.  Temperature is normal.  CENTRAL NERVOUS SYSTEM:  No gross neurological deficit.  __________.  HEART:  Regular sinus rhythm.  ABDOMEN:  Soft, flat liver, spleen.  Kidneys not palpable.  She has a  left nephrostomy tube in place  draining clear urine.  PELVIC:  Unremarkable.   IMPRESSION:  Left renal calculus.   PLAN:  Left percutaneous nephrolithotomy under anesthesia.      Ky Barban, M.D.  Electronically Signed     MIJ/MEDQ  D:  05/25/2006  T:  05/25/2006  Job:  [id number]   cc:   Ramon Dredge L. Juanetta Gosling, M.D.  Fax: [phone]

## 2010-09-06 NOTE — Op Note (Signed)
NAMEKEVIONNA, HEFFLER               ACCOUNT NO.:  [id number]   MEDICAL RECORD NO.:  [id number]          PATIENT TYPE:  OBV   LOCATION:  A301                          FACILITY:  [facility]   PHYSICIAN:  Ky Barban, M.D.DATE OF BIRTH:  10-26-1945   DATE OF PROCEDURE:  05/26/2006  DATE OF DISCHARGE:                               OPERATIVE REPORT   PREOPERATIVE DIAGNOSIS:  Left renal calculus.   POSTOPERATIVE DIAGNOSIS:  Left renal calculus.   PROCEDURE:  Left percutaneous nephrolithotripsy.   SURGEON:  Ky Barban, M.D.   ANESTHESIA:  General.   COMPLICATIONS:  None.   PROCEDURE:  With the patient under general anesthesia in complete prone  position, she already has percutaneously placed nephrostomy tube in the  lower pole calix on the left side.  The patient also has a catheter  going into the left ureter, so a safety guidewire is passed through this  ureteral catheter and the nephrostomy tube after making it straight was  removed.  Once the ureteral catheter was removed, the safety guidewire  is in the ureter over the safety guidewire, 8-10 ureteral catheter was  introduced and through this 8-10 ureteral catheter, two wires in the  left ureter 8-10 catheter was removed.  The safety guide wire was  stabilized to the skin with a couple of stitches of 0 silk stitch.  The  working guidewire I introduced Dance movement psychotherapist.  It is  positioned looking at the stone under fluoroscopy.  The balloon is  inflated for 4 minutes and then the working tube is pushed over the  balloon and is positioned between the renal pelvis and the skin.  Once  the working tube is in place, the balloon is removed and I introduced  nephroscope through the working tube.  The renal pelvis is examined.  There is no bleeding.  Stone is easily visualized.  I proceeded to treat  the stone with combined ultrasound and lithoclast.  Larina Bras is quite hard.  Then I had to use the laser.  Stone is broken.   As soon as was broken  and the pieces were removed with 3 pound forceps.  All the pieces were  removed.  Then I used a flexible cystoscope to look into the renal  pelvis and into the upper ureter.  I do not see any residual pieces.  The renal pelvis is completely intact.  I do not see any bleeding or any  perforation.  Through the cystoscope, I introduced a second working  guidewire into the ureter.  The cystoscope is removed and over this  second working guide wire introduced Field seismologist catheter  which the tip goes into the ureter.  Once that catheter was in place,  the tape was opened up into the renal pelvis and the guidewire is  removed from within the [place] catheter.  I also removed the first  safety guidewire.  The renal pelvis irrigates nicely.  Skin was closed  around the [place] with one stitch of 0 Vicryl stitch.  Sterile gauze  dressing applied.  The patient was placed in supine position,  stable, no  complications, left the operating room in satisfactory condition.      Ky Barban, M.D.  Electronically Signed     MIJ/MEDQ  D:  05/26/2006  T:  05/26/2006  Job:  [id number]   cc:   Dr. Juanetta Gosling

## 2010-10-11 ENCOUNTER — Ambulatory Visit (HOSPITAL_COMMUNITY): Payer: Medicaid Other | Attending: Pulmonary Disease

## 2010-10-26 ENCOUNTER — Emergency Department (HOSPITAL_COMMUNITY): Payer: Medicaid Other

## 2010-10-26 ENCOUNTER — Encounter: Payer: Self-pay | Admitting: Emergency Medicine

## 2010-10-26 ENCOUNTER — Emergency Department (HOSPITAL_COMMUNITY)
Admission: EM | Admit: 2010-10-26 | Discharge: 2010-10-26 | Disposition: A | Discharge status: Home / Self Care | Payer: Medicaid Other | Attending: Emergency Medicine | Admitting: Emergency Medicine

## 2010-10-26 DIAGNOSIS — R630 Anorexia: Secondary | ICD-10-CM | POA: Insufficient documentation

## 2010-10-26 DIAGNOSIS — R1084 Generalized abdominal pain: Secondary | ICD-10-CM | POA: Insufficient documentation

## 2010-10-26 DIAGNOSIS — I10 Essential (primary) hypertension: Secondary | ICD-10-CM | POA: Insufficient documentation

## 2010-10-26 DIAGNOSIS — R197 Diarrhea, unspecified: Secondary | ICD-10-CM | POA: Insufficient documentation

## 2010-10-26 DIAGNOSIS — F17200 Nicotine dependence, unspecified, uncomplicated: Secondary | ICD-10-CM | POA: Insufficient documentation

## 2010-10-26 DIAGNOSIS — R110 Nausea: Secondary | ICD-10-CM | POA: Insufficient documentation

## 2010-10-26 DIAGNOSIS — K589 Irritable bowel syndrome without diarrhea: Secondary | ICD-10-CM | POA: Insufficient documentation

## 2010-10-26 HISTORY — DX: Restless legs syndrome: G25.81

## 2010-10-26 HISTORY — DX: Unspecified osteoarthritis, unspecified site: M19.90

## 2010-10-26 HISTORY — DX: Irritable bowel syndrome, unspecified: K58.9

## 2010-10-26 HISTORY — DX: Essential (primary) hypertension: I10

## 2010-10-26 HISTORY — DX: Gastro-esophageal reflux disease without esophagitis: K21.9

## 2010-10-26 HISTORY — DX: Chronic obstructive pulmonary disease, unspecified: J44.9

## 2010-10-26 HISTORY — DX: Cerebral infarction, unspecified: I63.9

## 2010-10-26 HISTORY — DX: Pure hypercholesterolemia, unspecified: E78.00

## 2010-10-26 LAB — BASIC METABOLIC PANEL
Calcium: 9.4 mg/dL (ref 8.4–10.5)
GFR calc non Af Amer: >60 mL/min (ref >60)
Glucose, Bld: 109 mg/dL — ABNORMAL HIGH (ref 70–99)
Sodium: 139 mEq/L (ref 135–145)

## 2010-10-26 LAB — CBC
MCH: 32.2 pg (ref 26.0–34.0)
MCV: 86.4 fL (ref 78.0–100.0)
Platelets: 228 10*3/uL (ref 150–400)
RDW: 13.4 % (ref 11.5–15.5)

## 2010-10-26 LAB — URINALYSIS, ROUTINE W REFLEX MICROSCOPIC
Protein, ur: NEGATIVE mg/dL
Urobilinogen, UA: 0.2 mg/dL (ref 0.0–1.0)

## 2010-10-26 LAB — DIFFERENTIAL
Basophils Absolute: 0 10*3/uL (ref 0.0–0.1)
Eosinophils Absolute: 0.2 10*3/uL (ref 0.0–0.7)
Eosinophils Relative: 2 % (ref 0–5)

## 2010-10-26 LAB — HEPATIC FUNCTION PANEL
ALT: 15 U/L (ref 0–35)
AST: 18 U/L (ref 0–37)
Alkaline Phosphatase: 85 U/L (ref 39–117)
Indirect Bilirubin: 0.4 mg/dL (ref 0.3–0.9)
Total Protein: 7.6 g/dL (ref 6.0–8.3)

## 2010-10-26 LAB — URINE MICROSCOPIC-ADD ON

## 2010-10-26 MED ORDER — ONDANSETRON 8 MG PO TBDP: 8.0000 mg | ORAL_TABLET | Freq: Three times a day (TID) | ORAL | Status: DC | PRN

## 2010-10-26 MED ORDER — SODIUM CHLORIDE 0.9 % IV SOLN
999.0000 mL | INTRAVENOUS | Status: DC
  Administered 2010-10-26: 999 mL via INTRAVENOUS

## 2010-10-26 MED ORDER — IOHEXOL 300 MG/ML  SOLN
100.0000 mL | Freq: Once | INTRAMUSCULAR | Status: AC | PRN
  Administered 2010-10-26: 100 mL via INTRAVENOUS

## 2010-10-26 MED ORDER — SODIUM CHLORIDE 0.9 % IV SOLN
Freq: Once | INTRAVENOUS | Status: AC
  Administered 2010-10-26: 10:00:00 via INTRAVENOUS

## 2010-10-26 MED ORDER — HYDROMORPHONE HCL 1 MG/ML IJ SOLN
1.0000 mg | Freq: Once | INTRAMUSCULAR | Status: AC
  Administered 2010-10-26: 1 mg via INTRAVENOUS
  Filled 2010-10-26: qty 1

## 2010-10-26 MED ORDER — HYDROCODONE-ACETAMINOPHEN 5-325 MG PO TABS: 1.0000 | ORAL_TABLET | Freq: Four times a day (QID) | ORAL | Status: AC | PRN

## 2010-10-26 MED ORDER — ONDANSETRON HCL 4 MG/2ML IJ SOLN
4.0000 mg | Freq: Once | INTRAMUSCULAR | Status: AC
  Administered 2010-10-26: 4 mg via INTRAVENOUS
  Filled 2010-10-26: qty 2

## 2010-10-26 MED ORDER — SODIUM CHLORIDE 0.9 % IV SOLN: 999.0000 mL | Freq: Once | INTRAVENOUS | Status: DC

## 2010-10-26 MED ORDER — DICYCLOMINE HCL 20 MG PO TABS: 20.0000 mg | ORAL_TABLET | Freq: Four times a day (QID) | ORAL | Status: DC | PRN

## 2010-10-26 NOTE — ED Notes (Signed)
Pt states she feels a lot better and is ready to go. edp aware

## 2010-10-26 NOTE — ED Notes (Signed)
Family at bedside. 

## 2010-10-26 NOTE — ED Provider Notes (Signed)
History     Chief Complaint  Patient presents with   Abdominal Pain    lower abd pain    HPI Comments: Pt reports sudden onset of abd cramping/n/v/d 8 days ago; vomiting resolved after 3 days but pt still with nausea, anorexia, abd cramping, abd swelling, and diarrhea. >10 watery BMs yesterday. Is tolerating fluids.   Patient is a 65 y.o. female presenting with abdominal pain.  Abdominal Pain The primary symptoms of the illness include abdominal pain, nausea, vomiting and diarrhea. The primary symptoms of the illness do not include fever, shortness of breath or dysuria. Episode onset: 1 week ago. The onset of the illness was sudden. Progression since onset: persistent.  The abdominal pain is generalized. The severity of the abdominal pain is 10/10 (down to 8/10 in ED after pain meds).  Additional symptoms associated with the illness include anorexia. Symptoms associated with the illness do not include chills, heartburn, constipation or back pain. Associated symptoms comments: +decreased appetite. Significant associated medical issues include inflammatory bowel disease. Associated medical issues comments: IBS and gastroparesis; GI Dr. Jena Gauss; has had multiple endo/colonoscopies, last endoscopy  6 months ago; pt reports no previous abd CT scans;  Pt is usually on miralax to prevent constipation; stopped taking 2-3 days ago d/t persistent diarrhea.    Past Medical History  Diagnosis Date   Gastroenteritis    Hypertension    Arthritis    COPD (chronic obstructive pulmonary disease)    Hypercholesterolemia    Restless leg syndrome    Stroke    Acid reflux    Irritable bowel syndrome (IBS)     Past Surgical History  Procedure Date   Cholecystectomy    Tubal ligation    Carpal tunnel release    Fracture surgery r arm    Family History  Problem Relation Age of Onset   Cancer Father    Cancer Sister    Cancer Brother     History  Substance Use Topics   Smoking  status: Current Everyday Smoker -- 2.0 packs/day    Types: Cigarettes   Smokeless tobacco: Not on file   Alcohol Use: No    OB History    Grav Para Term Preterm Abortions TAB SAB Ect Mult Living                  Review of Systems  Constitutional: Positive for appetite change. Negative for fever and chills.  HENT: Negative for congestion and rhinorrhea.   Eyes: Negative for redness.  Respiratory: Negative for cough and shortness of breath.   Cardiovascular: Negative for chest pain and leg swelling.  Gastrointestinal: Positive for nausea, vomiting, abdominal pain, diarrhea, abdominal distention and anorexia. Negative for heartburn, constipation and blood in stool.  Genitourinary: Negative for dysuria and flank pain.  Musculoskeletal: Negative for back pain.  Skin: Negative for rash.  Neurological: Negative for headaches.    Physical Exam  BP 123/67   Pulse 91   Temp(Src) 98 F (36.7 C) (Oral)   Resp 20   Ht 5\' 3"  (1.6 m)   SpO2 97%  Physical Exam  Nursing note and vitals reviewed. Constitutional: She is oriented to person, place, and time. She appears well-developed and well-nourished. No distress.  HENT:  Head: Normocephalic and atraumatic.  Eyes: Conjunctivae and EOM are normal.  Neck: Normal range of motion. Neck supple.  Cardiovascular: Normal rate, regular rhythm and normal heart sounds.   Pulmonary/Chest: Effort normal and breath sounds normal. She has no wheezes. She  exhibits no tenderness.  Abdominal: Soft. Bowel sounds are normal. She exhibits distension. There is tenderness.       Mild diffuse abd tenderness  Musculoskeletal: Normal range of motion. She exhibits no edema and no tenderness.  Neurological: She is alert and oriented to person, place, and time. She has normal strength. No cranial nerve deficit or sensory deficit.  Skin: Skin is warm and dry. No rash noted.  Psychiatric: She has a normal mood and affect.    ED Course  Procedures Written by  Enos Fling acting as scribe for Dr. Deretha Emory.  MDM

## 2010-10-26 NOTE — ED Notes (Signed)
Patient is resting comfortably. 

## 2010-10-26 NOTE — ED Notes (Signed)
assisted pt to the bathroom. Pt ambulated with no difficulty. Family at bedside. NAD noted at this time.

## 2010-10-26 NOTE — ED Notes (Signed)
Lab called critical potassium of 2.5. Result read back. edp aware

## 2010-10-26 NOTE — ED Notes (Signed)
Pt complain of crampy pain throughout belly

## 2010-10-26 NOTE — ED Notes (Signed)
Pt states she feels a lot better. Drinking contrast for scan

## 2010-10-26 NOTE — ED Notes (Signed)
Pt has had this pain before but not this bad. Has hyoscamine but did not work for pain. N//v/d x 1 week also.

## 2010-10-29 ENCOUNTER — Other Ambulatory Visit (HOSPITAL_COMMUNITY): Payer: Medicaid Other

## 2010-10-31 ENCOUNTER — Other Ambulatory Visit: Payer: Self-pay

## 2010-10-31 ENCOUNTER — Encounter (HOSPITAL_COMMUNITY): Payer: Self-pay | Admitting: *Deleted

## 2010-10-31 ENCOUNTER — Inpatient Hospital Stay (HOSPITAL_COMMUNITY)
Admission: EM | Admit: 2010-10-31 | Discharge: 2010-11-02 | DRG: 641 | Disposition: A | Discharge status: Home / Self Care | Payer: Medicaid Other | Attending: Pulmonary Disease | Admitting: Pulmonary Disease

## 2010-10-31 DIAGNOSIS — F3289 Other specified depressive episodes: Secondary | ICD-10-CM | POA: Diagnosis present

## 2010-10-31 DIAGNOSIS — R1013 Epigastric pain: Secondary | ICD-10-CM | POA: Insufficient documentation

## 2010-10-31 DIAGNOSIS — F329 Major depressive disorder, single episode, unspecified: Secondary | ICD-10-CM | POA: Diagnosis present

## 2010-10-31 DIAGNOSIS — K589 Irritable bowel syndrome without diarrhea: Secondary | ICD-10-CM | POA: Diagnosis present

## 2010-10-31 DIAGNOSIS — K449 Diaphragmatic hernia without obstruction or gangrene: Secondary | ICD-10-CM | POA: Insufficient documentation

## 2010-10-31 DIAGNOSIS — E876 Hypokalemia: Principal | ICD-10-CM | POA: Diagnosis present

## 2010-10-31 DIAGNOSIS — K3184 Gastroparesis: Secondary | ICD-10-CM | POA: Diagnosis present

## 2010-10-31 DIAGNOSIS — J4489 Other specified chronic obstructive pulmonary disease: Secondary | ICD-10-CM | POA: Diagnosis present

## 2010-10-31 DIAGNOSIS — K219 Gastro-esophageal reflux disease without esophagitis: Secondary | ICD-10-CM | POA: Diagnosis present

## 2010-10-31 DIAGNOSIS — R110 Nausea: Secondary | ICD-10-CM | POA: Insufficient documentation

## 2010-10-31 DIAGNOSIS — I639 Cerebral infarction, unspecified: Secondary | ICD-10-CM

## 2010-10-31 DIAGNOSIS — J449 Chronic obstructive pulmonary disease, unspecified: Secondary | ICD-10-CM

## 2010-10-31 HISTORY — DX: Major depressive disorder, single episode, unspecified: F32.9

## 2010-10-31 HISTORY — DX: Gastroparesis: K31.84

## 2010-10-31 HISTORY — DX: Depression, unspecified: F32.A

## 2010-10-31 LAB — BASIC METABOLIC PANEL
BUN: <3 mg/dL — ABNORMAL LOW (ref 6–23)
CO2: 31 mEq/L (ref 19–32)
Calcium: 10.3 mg/dL (ref 8.4–10.5)
Creatinine, Ser: 0.74 mg/dL (ref 0.50–1.10)
Glucose, Bld: 157 mg/dL — ABNORMAL HIGH (ref 70–99)

## 2010-10-31 LAB — URINALYSIS, ROUTINE W REFLEX MICROSCOPIC
Bilirubin Urine: NEGATIVE
Glucose, UA: NEGATIVE mg/dL
Ketones, ur: NEGATIVE mg/dL
Protein, ur: NEGATIVE mg/dL

## 2010-10-31 MED ORDER — ROPINIROLE HCL 1 MG PO TABS
1.0000 mg | ORAL_TABLET | Freq: Every day | ORAL | Status: DC
  Administered 2010-10-31 – 2010-11-01 (×2): 1 mg via ORAL
  Filled 2010-10-31 (×4): qty 1

## 2010-10-31 MED ORDER — SODIUM CHLORIDE 0.9 % IV SOLN
Freq: Once | INTRAVENOUS | Status: AC
  Administered 2010-10-31: 1000 mL via INTRAVENOUS

## 2010-10-31 MED ORDER — ACETAMINOPHEN 325 MG PO TABS: 650.0000 mg | ORAL_TABLET | Freq: Four times a day (QID) | ORAL | Status: DC | PRN

## 2010-10-31 MED ORDER — SODIUM CHLORIDE 0.9 % IJ SOLN
INTRAMUSCULAR | Status: AC
  Administered 2010-10-31: 10 mL
  Filled 2010-10-31: qty 10

## 2010-10-31 MED ORDER — SODIUM CHLORIDE 0.9 % IV SOLN: Freq: Once | INTRAVENOUS | Status: DC

## 2010-10-31 MED ORDER — MAGNESIUM SULFATE 40 MG/ML IJ SOLN
2.0000 g | Freq: Once | INTRAMUSCULAR | Status: AC
  Administered 2010-10-31: 2 g via INTRAVENOUS
  Filled 2010-10-31: qty 50

## 2010-10-31 MED ORDER — TIOTROPIUM BROMIDE MONOHYDRATE 18 MCG IN CAPS
18.0000 ug | ORAL_CAPSULE | Freq: Every day | RESPIRATORY_TRACT | Status: DC
  Administered 2010-10-31 – 2010-11-01 (×2): 18 ug via RESPIRATORY_TRACT
  Filled 2010-10-31: qty 5

## 2010-10-31 MED ORDER — SODIUM CHLORIDE 0.9 % IJ SOLN
INTRAMUSCULAR | Status: AC
  Administered 2010-10-31: 19:00:00
  Filled 2010-10-31: qty 10

## 2010-10-31 MED ORDER — POTASSIUM CHLORIDE CRYS ER 20 MEQ PO TBCR
40.0000 meq | EXTENDED_RELEASE_TABLET | Freq: Two times a day (BID) | ORAL | Status: DC
  Administered 2010-10-31 – 2010-11-02 (×5): 40 meq via ORAL
  Filled 2010-10-31 (×5): qty 2

## 2010-10-31 MED ORDER — HYDROCODONE-ACETAMINOPHEN 5-325 MG PO TABS
1.0000 | ORAL_TABLET | Freq: Four times a day (QID) | ORAL | Status: DC | PRN
  Administered 2010-11-01 (×2): 1 via ORAL
  Filled 2010-10-31 (×2): qty 1

## 2010-10-31 MED ORDER — ALPRAZOLAM 0.5 MG PO TABS
0.5000 mg | ORAL_TABLET | Freq: Four times a day (QID) | ORAL | Status: DC | PRN
  Administered 2010-10-31 – 2010-11-02 (×5): 0.5 mg via ORAL
  Filled 2010-10-31 (×5): qty 1

## 2010-10-31 MED ORDER — ROSUVASTATIN CALCIUM 20 MG PO TABS
20.0000 mg | ORAL_TABLET | Freq: Every day | ORAL | Status: DC
  Administered 2010-10-31 – 2010-11-02 (×3): 20 mg via ORAL
  Filled 2010-10-31 (×3): qty 1

## 2010-10-31 MED ORDER — PROMETHAZINE HCL 25 MG/ML IJ SOLN
12.5000 mg | Freq: Once | INTRAMUSCULAR | Status: AC
  Administered 2010-10-31: 12.5 mg via INTRAVENOUS
  Filled 2010-10-31: qty 1

## 2010-10-31 MED ORDER — ENOXAPARIN SODIUM 40 MG/0.4ML ~~LOC~~ SOLN
40.0000 mg | SUBCUTANEOUS | Status: DC
  Administered 2010-10-31 – 2010-11-01 (×2): 40 mg via SUBCUTANEOUS
  Filled 2010-10-31 (×2): qty 0.4

## 2010-10-31 MED ORDER — ZOLPIDEM TARTRATE 5 MG PO TABS: 10.0000 mg | ORAL_TABLET | Freq: Every evening | ORAL | Status: DC | PRN

## 2010-10-31 MED ORDER — POTASSIUM CHLORIDE 20 MEQ PO PACK
40.0000 meq | PACK | Freq: Once | ORAL | Status: AC
  Administered 2010-10-31: 40 meq via ORAL
  Filled 2010-10-31: qty 2

## 2010-10-31 MED ORDER — PROMETHAZINE HCL 25 MG/ML IJ SOLN
12.5000 mg | Freq: Four times a day (QID) | INTRAMUSCULAR | Status: DC | PRN
  Administered 2010-10-31: 19:00:00 via INTRAVENOUS
  Administered 2010-11-01 (×2): 12.5 mg via INTRAVENOUS
  Filled 2010-10-31 (×3): qty 1

## 2010-10-31 MED ORDER — ACETAMINOPHEN 650 MG RE SUPP: 650.0000 mg | Freq: Four times a day (QID) | RECTAL | Status: DC | PRN

## 2010-10-31 MED ORDER — PANTOPRAZOLE SODIUM 40 MG IV SOLR
40.0000 mg | INTRAVENOUS | Status: DC
  Administered 2010-10-31 – 2010-11-02 (×3): 40 mg via INTRAVENOUS
  Filled 2010-10-31 (×3): qty 40

## 2010-10-31 MED ORDER — NICOTINE 21 MG/24HR TD PT24
21.0000 mg | MEDICATED_PATCH | Freq: Every day | TRANSDERMAL | Status: DC
  Administered 2010-10-31 – 2010-11-01 (×2): 21 mg via TRANSDERMAL
  Filled 2010-10-31 (×3): qty 1

## 2010-10-31 MED ORDER — SODIUM CHLORIDE 0.9 % IV SOLN
INTRAVENOUS | Status: DC
  Administered 2010-10-31: 20:00:00 via INTRAVENOUS
  Administered 2010-10-31: 125 mL via INTRAVENOUS
  Administered 2010-11-01 (×2): via INTRAVENOUS

## 2010-10-31 MED ORDER — ASPIRIN-DIPYRIDAMOLE ER 25-200 MG PO CP12
1.0000 | ORAL_CAPSULE | Freq: Two times a day (BID) | ORAL | Status: DC
  Administered 2010-10-31 – 2010-11-02 (×5): 1 via ORAL
  Filled 2010-10-31 (×9): qty 1

## 2010-10-31 NOTE — ED Notes (Signed)
Patient with difficulty with swallowing and jerky movements, seen here Sat., pt thinks she is having an allergic reaction to Zofran

## 2010-10-31 NOTE — ED Notes (Signed)
Report given. Pt stable. IVF infusing as ordered. Pt to be transported on monitor.

## 2010-10-31 NOTE — ED Provider Notes (Signed)
History     Chief Complaint  Patient presents with  . Dysphagia  . Tremors   HPI Comments: Patient presents to the emergency department with complaint of difficulty swallowing, dry mouth and jerking of the extremities. She has a history of recent emergency room visit for a nausea vomiting and diarrhea for which she was given Zofran and fluids. However she continued to take the Zofran over the last 3 days she's noted that her lower extremities have an occasional myoclonic jerk. Nothing seems to make this better or worse, it is intermittent, and it is associated with a dry mouth. She discussed this with her primary care physician Dr. Juanetta Gosling last night who suggested that she come to the emergency room for further evaluation per the patient. Patient denies any chest pain shortness of breath fevers chills vomiting and states that her diarrhea has resolved and now she is having loose stools.  The history is provided by the patient and medical records.    Past Medical History  Diagnosis Date  . Gastroenteritis   . Hypertension   . Arthritis   . COPD (chronic obstructive pulmonary disease)   . Hypercholesterolemia   . Restless leg syndrome   . Stroke   . Acid reflux   . Irritable bowel syndrome (IBS)     Past Surgical History  Procedure Date  . Cholecystectomy   . Tubal ligation   . Carpal tunnel release   . Fracture surgery r arm    Family History  Problem Relation Age of Onset  . Cancer Father   . Cancer Sister   . Cancer Brother     History  Substance Use Topics  . Smoking status: Current Everyday Smoker -- 2.0 packs/day    Types: Cigarettes  . Smokeless tobacco: Not on file  . Alcohol Use: No    OB History    Grav Para Term Preterm Abortions TAB SAB Ect Mult Living                  Review of Systems  Constitutional: Negative for fever and chills.  HENT: Negative for congestion, rhinorrhea and neck pain.   Eyes: Negative for photophobia.  Respiratory: Negative  for chest tightness.   Cardiovascular: Negative for chest pain, palpitations and leg swelling.  Genitourinary: Negative for difficulty urinating.  Musculoskeletal: Negative for back pain.  Neurological: Negative for headaches.       Myoclonic jerking  Hematological: Negative for adenopathy.  Psychiatric/Behavioral: Negative for behavioral problems.  All other systems reviewed and are negative.    Physical Exam  BP 187/88  Pulse 95  Temp(Src) 98.3 F (36.8 C) (Oral)  Resp 22  SpO2 97%  Physical Exam  Constitutional: She appears well-developed and well-nourished. No distress.  HENT:  Head: Normocephalic and atraumatic.  Mouth/Throat: No oropharyngeal exudate.       Mucous membranes are mildly dehydrated.  Eyes: Conjunctivae and EOM are normal. Pupils are equal, round, and reactive to light. No scleral icterus.  Neck: Normal range of motion. Neck supple.  Cardiovascular: Normal rate, regular rhythm and normal heart sounds.   No murmur heard. Pulmonary/Chest: Effort normal and breath sounds normal. No respiratory distress. She has no wheezes.       Lungs are clear without wheezing or rales and there is no tachypnea.  Abdominal: Soft. Bowel sounds are normal. She exhibits no distension. There is no tenderness. There is no rebound.  Musculoskeletal: Normal range of motion. She exhibits no edema and no tenderness.  Lymphadenopathy:    She has no cervical adenopathy.  Neurological: She is alert.       Patient has normal speech and is able to follow commands. She is able to use all 4 extremities with normal strength and sensation including normal grips. She does have the occasional myoclonic jerk which she states she is unable to control this appears to be a minor intermittent tremor. It involves mostly the lower extremities.  Skin: Skin is warm and dry. No rash noted. She is not diaphoretic. No erythema.    ED Course  Procedures  MDM Patient overall appears well with some mild  dehydration possibly related to the nausea vomiting and diarrhea for which she's had for the last week or so. It is unclear whether she is having an allergic reaction to the Zofran or not however it is worth checking her potassium given her recent GI illness. We'll also give a liter of IV fluids and reevaluate.   ED ECG REPORT   Date: 10/31/2010   Rate: 76  Rhythm: normal sinus rhythm  QRS Axis: normal  Intervals: normal  ST/T Wave abnormalities: normal, U waves present  Conduction Disutrbances:none  Narrative Interpretation:   Old EKG Reviewed: none available  On reexamination patient continues to have mild tremor of the lower 70s. Her laboratory workup reveals that she has a potassium of less than 2, chloride of 84, sodium of 132, normal glucose and a creatinine of 0.74. We will go ahead and give magnesium sulfate IV 2 g over the next 20 minutes followed by potassium by mouth and by IV. I will consult Dr. Juanetta Gosling for admission for the severe hypokalemia.  Discussed her with Dr. Juanetta Gosling who will come to the patient.  Vida Roller, MD 10/31/10 [phone]

## 2010-10-31 NOTE — ED Notes (Signed)
Pt reports tremors began on Monday.  States that they got worse last night. Pt denies pain, but does have some nausea.  Reports that she feels like there is something in her throat which makes it somewhat difficult to breath and swallow.  Pt recently seen for N/V/D and was discharged with Zofran.  Pt believes current symptoms are a reaction to the Zofran.  EDP at bedside.

## 2010-11-01 ENCOUNTER — Encounter (HOSPITAL_COMMUNITY): Payer: Self-pay | Admitting: Gastroenterology

## 2010-11-01 DIAGNOSIS — R109 Unspecified abdominal pain: Secondary | ICD-10-CM

## 2010-11-01 DIAGNOSIS — R112 Nausea with vomiting, unspecified: Secondary | ICD-10-CM

## 2010-11-01 DIAGNOSIS — K3184 Gastroparesis: Secondary | ICD-10-CM

## 2010-11-01 DIAGNOSIS — E876 Hypokalemia: Secondary | ICD-10-CM

## 2010-11-01 DIAGNOSIS — I639 Cerebral infarction, unspecified: Secondary | ICD-10-CM

## 2010-11-01 DIAGNOSIS — J449 Chronic obstructive pulmonary disease, unspecified: Secondary | ICD-10-CM

## 2010-11-01 HISTORY — DX: Hypokalemia: E87.6

## 2010-11-01 LAB — CBC
HCT: 35.5 % — ABNORMAL LOW (ref 36.0–46.0)
MCH: 31.5 pg (ref 26.0–34.0)
MCV: 88.1 fL (ref 78.0–100.0)
Platelets: 254 10*3/uL (ref 150–400)
RBC: 4.03 MIL/uL (ref 3.87–5.11)

## 2010-11-01 LAB — BASIC METABOLIC PANEL
BUN: 3 mg/dL — ABNORMAL LOW (ref 6–23)
CO2: 30 mEq/L (ref 19–32)
CO2: 32 mEq/L (ref 19–32)
Calcium: 8.6 mg/dL (ref 8.4–10.5)
Calcium: 8.9 mg/dL (ref 8.4–10.5)
Chloride: 105 mEq/L (ref 96–112)
Creatinine, Ser: 0.58 mg/dL (ref 0.50–1.10)
Glucose, Bld: 92 mg/dL (ref 70–99)
Glucose, Bld: 90 mg/dL (ref 70–99)
Sodium: 144 mEq/L (ref 135–145)

## 2010-11-01 MED ORDER — NON FORMULARY: 10.0000 mg | Freq: Three times a day (TID) | Status: DC

## 2010-11-01 MED ORDER — SODIUM CHLORIDE 0.9 % IJ SOLN
INTRAMUSCULAR | Status: AC
  Administered 2010-11-01: 10:00:00
  Filled 2010-11-01: qty 10

## 2010-11-01 MED ORDER — SODIUM CHLORIDE 0.9 % IV SOLN: INTRAVENOUS | Status: DC

## 2010-11-01 MED ORDER — SODIUM CHLORIDE 0.9 % IJ SOLN
INTRAMUSCULAR | Status: AC
  Administered 2010-11-01: 15:00:00
  Filled 2010-11-01: qty 10

## 2010-11-01 MED ORDER — POTASSIUM CHLORIDE 10 MEQ/100ML IV SOLN
10.0000 meq | INTRAVENOUS | Status: AC
  Administered 2010-11-01 (×4): 10 meq via INTRAVENOUS
  Filled 2010-11-01 (×4): qty 100

## 2010-11-01 MED ORDER — NONFORMULARY OR COMPOUNDED ITEM
1.0000 | Freq: Three times a day (TID) | Status: DC
  Administered 2010-11-01 – 2010-11-02 (×4): 1 via ORAL
  Filled 2010-11-01 (×12): qty 1

## 2010-11-01 NOTE — Progress Notes (Signed)
Subjective: She is still having abdominal discomfort and diarrhea.  Objective: Vital signs in last 24 hours: Temp:  [97.6 F (36.4 C)-99.2 F (37.3 C)] 98.5 F (36.9 C) (07/13 0600) Pulse Rate:  [71-87] 87  (07/13 0600) Resp:  [14-20] 14  (07/13 0600) BP: (123-131)/(69-75) 131/75 mmHg (07/13 0600) SpO2:  [94 %-97 %] 95 % (07/13 0759) Weight change:  Last BM Date: 11/01/10  Intake/Output from previous day: 07/12 0701 - 07/13 0700 In: 3711 [P.O.:720; I.V.:2991] Out: 2200 [Urine:2200]  PHYSICAL EXAM General appearance: alert, appears older than stated age and mild distress Resp: rhonchi bilaterally Cardio: regular rate and rhythm, S1, S2 normal, no murmur, click, rub or gallop GI: abnormal findings:  distended and hyperactive bowel sounds Extremities: extremities normal, atraumatic, no cyanosis or edema  Lab Results:  [facility] 11/01/10 0516  WBC 6.6  HGB 12.7  HCT 35.5*  PLT 254   BMET  Basename 11/01/10 0516 10/31/10 0637  NA 144 132*  K 2.5* <2.0*  CL 105 84*  CO2 30 31  GLUCOSE 92 157*  BUN <3* <3*  CREATININE 0.61 0.74  CALCIUM 8.6 10.3    Studies/Results: No results found.  Medications:  Scheduled:   . sodium chloride   Intravenous Once  . dipyridamole-aspirin  1 capsule Oral BID  . enoxaparin  40 mg Subcutaneous Q24H  . nicotine  21 mg Transdermal Daily  . pantoprazole (PROTONIX) IV  40 mg Intravenous Q24H  . potassium chloride  10 mEq Intravenous Q1 Hr x 4  . potassium chloride  40 mEq Oral BID  . rOPINIRole  1 mg Oral QHS  . rosuvastatin  20 mg Oral Daily  . sodium chloride      . sodium chloride      . sodium chloride      . tiotropium  18 mcg Inhalation Daily    Assesment:she is still having trouble and is still hypokalemic. Active Problems:  * No active hospital problems. *     Plan:GI consult. IV potassium replacement.    LOS: 1 day   Avrum Kimball L 11/01/2010, 11:14 AM

## 2010-11-01 NOTE — Telephone Encounter (Signed)
Pt is an inpatient at [facility].

## 2010-11-01 NOTE — Consults (Signed)
Referring Provider: Fredirick Maudlin, MD Primary Care Physician:  Fredirick Maudlin, MD Primary Gastroenterologist:  Dr. Roetta Sessions    Reason for Consultation:  Abdominal pain, N/V  HPI:  65 y/o WF who is admitted for hypokalemia by Dr. Juanetta Gosling. We will consult regarding abdominal pain and nausea and vomiting. She has a long history of GERD, gastroparesis, and irritable bowel syndrome. EGD in January 2012 showed Schatzki ring, mild amount of retained food. Last colonoscopy in 2008 showed hemorrhoids. Gastroparesis treated with domperidone 10 mg q. a.c. and each bedtime. Intolerant to Reglan.  Seen in ED initially on 10/26/2010. Seen for abdominal cramping, vomiting, diarrhea. CT and labs were done and she was sent home. CT of the abdomen pelvis was unremarkable. LFTs were normal. CBC was normal. Potassium was 2.5. It is unclear whether she received replacement of her potassium. Came back yesterday with complaints of jerking. She thought it might have been the Zofran which he was started on. She also had palpitations. Came back to ED, potassium was less than 2. CBC and creatinine remained normal.   Today severe abdominal cramps in lower abdomen. Some nausea. PP watery stools. One to 2 daily. No vomiting in 2 days. Denies melena or rectal bleeding. On a chronic basis she has intermittent nausea. She was seen at [facility] for her gastroparesis. She states they told her to continue domperidone. She states they switch her PPI from Nexium to pantoprazole. She states she had taken erythromycin only a couple of different times in addition to her domperidone for the gastroparesis. Intentions were for her to remain on it chronically. She did not understand this.   Stopped hydrocodone two weeks ago, was taking for arthritis. Had been on it for couple of years. Decided to stop the narcotics couple of weeks ago "cold [place]". Usually would take at least 1-2 daily. All of her GI symptoms seem to  worsen afterwards. Her cramping intensified. Her diarrhea seemed to be worse. Nausea and vomiting developed.  Generally takes MiraLax on a regular basis for constipation. Sometimes it will induce diarrhea but she states she was instructed to take it daily anyway by the doctors over at [facility]. If doesn't take Miralax, then constipated. Hyoscyamine as needed for abdominal cramps.    Has had some problems swallowing but better today. Feels like something stuck in throat. Recent vomiting, none in a couple of days. Lots of indigestion/burping.    Prior to Admission medications   Medication Sig Start Date End Date Taking? Authorizing Provider  amLODipine-benazepril (LOTREL) 10-20 MG per capsule Take 1 capsule by mouth daily.    Yes Historical Provider, MD  dipyridamole-aspirin (AGGRENOX) 25-200 MG per 12 hr capsule Take 1 capsule by mouth 2 (two) times daily.    Yes Historical Provider, MD  furosemide (LASIX) 40 MG tablet Take 40 mg by mouth daily. PRN   Yes Historical Provider, MD  hyoscyamine (LEVSIN, ANASPAZ) 0.125 MG tablet Take 0.125 mg by mouth every 4 (four) hours as needed.    Yes Historical Provider, MD  hyoscyamine (LEVSIN, ANASPAZ) 0.125 MG tablet Take 0.125 mg by mouth 4 (four) times daily as needed. Takes med before each meal and at bedtime As needed for abdominal pain    Yes Historical Provider, MD  OVER THE COUNTER MEDICATION Take 10 mg by mouth 4 (four) times daily. Medication is Domperidone taken before each meal and at bedtime    Yes Historical Provider, MD  rOPINIRole (REQUIP) 1 MG tablet Take 1 mg by  mouth at bedtime.     Yes Historical Provider, MD  rosuvastatin (CRESTOR) 20 MG tablet Take 20 mg by mouth daily.     Yes Historical Provider, MD  STUDY MEDICATION    Yes Historical Provider, MD  tiotropium (SPIRIVA) 18 MCG inhalation capsule Place 18 mcg into inhaler and inhale daily.    Yes Historical Provider, MD  aluminum sulfate-calcium acetate (DOMEBORO) packet Apply 1 packet  topically 4 (four) times daily.     Historical Provider, MD  dicyclomine (BENTYL) 20 MG tablet Take 1 tablet (20 mg total) by mouth every 6 (six) hours as needed. 10/26/10 10/26/11  Shelda Jakes, MD  erythromycin ethylsuccinate (EES) 200 MG/5ML suspension Take 3 cc by mouth 4 (four) times daily -  with meals and at bedtime.   Last took two weeks ago.   Historical Provider, MD  HYDROcodone-acetaminophen (NORCO) 5-325 MG per tablet Take 1-2 tablets by mouth every 6 (six) hours as needed for pain. 10/26/10 11/05/10  Shelda Jakes, MD  ondansetron (ZOFRAN ODT) 8 MG disintegrating tablet Take 1 tablet (8 mg total) by mouth every 8 (eight) hours as needed for nausea. 10/26/10 11/02/10  Shelda Jakes, MD  pantoprazole (PROTONIX) 40 MG tablet Take 40 mg by mouth 2 (two) times daily.     Historical Provider, MD    Current Facility-Administered Medications  Medication Dose Route Frequency Provider Last Rate Last Dose  . 0.9 %  sodium chloride infusion   Intravenous Once Vida Roller, MD      . 0.9 %  sodium chloride infusion   Intravenous Continuous Fredirick Maudlin 125 mL/hr at 11/01/10 0447    . acetaminophen (TYLENOL) tablet 650 mg  650 mg Oral Q6H PRN Fredirick Maudlin       Or  . acetaminophen (TYLENOL) suppository 650 mg  650 mg Rectal Q6H PRN Fredirick Maudlin      . ALPRAZolam Prudy Feeler) tablet 0.5 mg  0.5 mg Oral QID PRN Fredirick Maudlin   0.5 mg at 11/01/10 0430  . dipyridamole-aspirin (AGGRENOX) 25-200 MG per 12 hr capsule 1 capsule  1 capsule Oral BID Fredirick Maudlin   1 capsule at 11/01/10 1009  . enoxaparin (LOVENOX) injection 40 mg  40 mg Subcutaneous Q24H Edward L Hawkins   40 mg at 11/01/10 1009  . HYDROcodone-acetaminophen (NORCO) 5-325 MG per tablet 1-2 tablet  1-2 tablet Oral Q6H PRN Fredirick Maudlin   1 tablet at 11/01/10 0750  . nicotine (NICODERM CQ - dosed in mg/24 hours) patch 21 mg  21 mg Transdermal Daily Fredirick Maudlin   21 mg at 11/01/10 1008  . pantoprazole (PROTONIX)  injection 40 mg  40 mg Intravenous Q24H Edward L Hawkins   40 mg at 11/01/10 1008  . potassium chloride 10 mEq in 100 mL IVPB  10 mEq Intravenous Q1 Hr x 4 Richard M Dondiego   10 mEq at 11/01/10 1226  . potassium chloride SA (K-DUR,KLOR-CON) CR tablet 40 mEq  40 mEq Oral BID Fredirick Maudlin   40 mEq at 11/01/10 1009  . promethazine (PHENERGAN) injection 12.5 mg  12.5 mg Intravenous Q6H PRN Edward L Hawkins   12.5 mg at 11/01/10 0750  . rOPINIRole (REQUIP) tablet 1 mg  1 mg Oral QHS Fredirick Maudlin   1 mg at 10/31/10 2251  . rosuvastatin (CRESTOR) tablet 20 mg  20 mg Oral Daily Fredirick Maudlin   20 mg at 11/01/10 1009  . sodium  chloride 0.9 % injection           . sodium chloride 0.9 % injection           . sodium chloride 0.9 % injection           . tiotropium (SPIRIVA) inhalation capsule 18 mcg  18 mcg Inhalation Daily Fredirick Maudlin   18 mcg at 11/01/10 0759  . zolpidem (AMBIEN) tablet 10 mg  10 mg Oral QHS PRN Fredirick Maudlin        Allergies as of 10/31/2010 - Review Complete 10/31/2010  Allergen Reaction Noted  . Metoclopramide hcl    . Penicillins     Past Medical History  Diagnosis Date       . Hypertension   . Arthritis   . COPD (chronic obstructive pulmonary disease)   . Hypercholesterolemia   . Restless leg syndrome   . Stroke   . Acid reflux   . Irritable bowel syndrome (IBS)   . Gastroparesis     INTOLERANT TO REGLAN  . Depression    Past Surgical History  Procedure Date  . Cholecystectomy 1978  . Tubal ligation 1972  . Carpal tunnel release     bilateral  . Fracture surgery 2011    right arm  . Esophagogastroduodenoscopy 04/2010    Schatzki ring s/p dilation, minimal retained food. Versed 7/Demeral 150/Phenergan12.5.  . Kidney stone surgery 2008  . Colonoscopy 09/2006    hemorrhoids     Family History  Problem Relation Age of Onset  . Cancer Father     ?leukemia  . Cancer Sister   . Cancer Brother 2    colon cancer    History   Social  History  . Marital Status: Divorced    Spouse Name: N/A    Number of Children: 3  . Years of Education: N/A   Occupational History  .     Social History Main Topics  . Smoking status: Current Everyday Smoker -- 2.0 packs/day    Types: Cigarettes  . Smokeless tobacco: Not on file  . Alcohol Use: No  . Drug Use: No  . Sexually Active: Not Currently     Review of Systems: Gen: Denies any fever, chills, sweats, anorexia, malaise, weight loss, and sleep disorder. C/O fatigue and weakness. CV: Denies chest pain, angina, syncope, orthopnea, PND, peripheral edema, and claudication. C/O palpitations prior to presentation. Resolved. Resp: Denies dyspnea at rest, dyspnea with exercise, cough, sputum, wheezing, coughing up blood, and pleurisy. GI: Denies vomiting blood, jaundice, and fecal incontinence.   Denies dysphasia or odynophagia. GU : Denies urinary burning, blood in urine, urinary frequency, urinary hesitancy, nocturnal urination, and urinary incontinence. MS: Denies limitation of movement, and swelling, stiffness, low back pain, extremity pain. Denies muscle weakness, atrophy. Leg cramps resolved. C/O chronic joint pain, on Vicodin for years, recently stopped "cold [place]". Derm: Denies rash, itching, dry skin, hives, moles, warts, or unhealing ulcers.  Psych: Denies depression, anxiety, memory loss, suicidal ideation, hallucinations, paranoia, and confusion. Heme: Denies bruising, bleeding, and enlarged lymph nodes.  Physical Exam: Vital signs in last 24 hours: Temp:  [98.5 F (36.9 C)-99.2 F (37.3 C)] 98.5 F (36.9 C) (07/13 0600) Pulse Rate:  [79-87] 87  (07/13 0600) Resp:  [14-20] 14  (07/13 0600) BP: (125-131)/(69-75) 131/75 mmHg (07/13 0600) SpO2:  [95 %-97 %] 95 % (07/13 0759) Last BM Date: 11/01/10 General:   Alert,  Well-developed, well-nourished, pleasant and cooperative in NAD Head:  Normocephalic and  atraumatic. Eyes:  Sclera clear, no icterus.   Conjunctiva  pink. Ears:  Normal auditory acuity. Nose:  No deformity, discharge,  or lesions. Mouth:  No deformity or lesions, dentition normal. Neck:  Supple. Lungs:  Clear throughout to auscultation.   No wheezes, crackles, or rhonchi. No acute distress. Heart:  Regular rate and rhythm; no murmurs, clicks, rubs,  or gallops. Abdomen:  Soft, nondistended. No masses, hepatosplenomegaly or hernias noted. Normal bowel sounds, without guarding, and without rebound.  Diffuse abdominal tenderness, left greater than right.  Msk:  Symmetrical without gross deformities. Normal posture. Pulses:  Normal pulses noted. Extremities:  Without clubbing or edema. Neurologic:  Alert and  oriented x4;  grossly normal neurologically. Skin:  Intact without significant lesions or rashes. Cervical Nodes:  No significant cervical adenopathy. Psych:  Alert and cooperative. Normal mood and affect.  Intake/Output from previous day: 07/12 0701 - 07/13 0700 In: 3711 [P.O.:720; I.V.:2991] Out: 2200 [Urine:2200] Intake/Output this shift: I/O this shift: In: -  Out: 600 [Urine:600]  Lab Results:  [facility] 11/01/10 0516  WBC 6.6  HGB 12.7  HCT 35.5*  PLT 254   BMET  Lab 11/01/10 1239 11/01/10 0516 10/31/10 0637  NA 144 144 132*  K 3.7 2.5* <2.0*  CL 106 105 84*  CO2 32 30 31  BUN 3* <3* <3*  CREATININE 0.58 0.61 0.74  LABGLOM -- -- --  GLUCOSE 90 -- --  CALCIUM 8.9 8.6 10.3    LFTs normal 10/26/2010.  Studies/Results: CT ABDOMEN AND PELVIS WITH CONTRAST: 10/26/2010 Technique: Multidetector CT imaging of the abdomen and pelvis was  performed following the standard protocol during bolus  administration of intravenous contrast.  Contrast: 100 ml Omnipaque-300  Comparison: None.  Findings: Prior cholecystectomy noted. Diffuse hepatic steatosis  is seen. No liver masses are identified. No evidence of biliary  ductal dilatation.  The pancreas, spleen, adrenal glands, and kidneys are normal in  appearance. No  evidence of hydronephrosis.  The uterus and adnexa are unremarkable in appearance. No soft  tissue masses or lymphadenopathy identified within the abdomen or  pelvis. A small amount of free fluid is seen in the pelvis, which  is nonspecific. No focal inflammatory process or abscess  identified. Unopacified small and large bowel are unremarkable in  appearance.  IMPRESSION:  1. Small amount of pelvic free fluid, which is nonspecific. No  focal inflammatory process or abscess identified.  2. Hepatic steatosis.  Original Report Authenticated By: Danae Orleans, M.D.  Impression: 65 y/o WM with h/o gastroparesis, GERD, IBS presents with abdominal cramping, vomiting, diarrhea on 10/26/10. Significant hypokalemia in ED, CT A/P unremarkable. Patient sent home and returned yesterday with profound hypokalemia. Takes domperidone qac and qhs. At some point, she stopped EES. Suspect exacerbation of gastroparesis, IBS +/- narcotic withdrawal symptoms. Consider infectious etiology.  Plan: Restart domperidone. May need to add back EES but will wait for now. Stool studies. Advance to full liquids. Supportive measures. Recheck Potassium as planned.   Thank you for allowing [place] to take part in the care of this nice patient.      LOS: 1 day   Tana Coast  11/01/2010, 2:00 PM

## 2010-11-01 NOTE — Telephone Encounter (Signed)
Are we actively seeing pt? If not, she needs FU OV here prior to further RFs. Thanks

## 2010-11-01 NOTE — Progress Notes (Addendum)
CRITICAL VALUE ALERT  Critical value received:  Pot 2.5   Date of notification:  11/01/2010  Time of notification:  06:15   Critical value read back:yes  Nurse who received alert:  Rhae Hammock, RN  MD notified (1st page):  Dr. Janna Arch   Time of first page:  06:32  MD notified (2nd page):  Time of second page:  Responding MD:  Dr. Janna Arch  Time MD responded:  06:35  Dr. Janna Arch gave orders for 4 runs of pot, 10 mEq, IV today; BMET at 12:00 noon; BMET AM on 11/02/10

## 2010-11-01 NOTE — Progress Notes (Signed)
Patient did well overnight.  She did complain of some heartburn and wondered if she had gotten her acid blocker today.  It was administered at 10:00 on 10/31/10 and is every 12 hours.  She then complained of anxiety and agitation and Xanax was given, which relieved.

## 2010-11-01 NOTE — Telephone Encounter (Signed)
Please schedule pt appt.  

## 2010-11-02 DIAGNOSIS — R109 Unspecified abdominal pain: Secondary | ICD-10-CM

## 2010-11-02 DIAGNOSIS — R197 Diarrhea, unspecified: Secondary | ICD-10-CM

## 2010-11-02 DIAGNOSIS — R112 Nausea with vomiting, unspecified: Secondary | ICD-10-CM

## 2010-11-02 LAB — BASIC METABOLIC PANEL
BUN: 3 mg/dL — ABNORMAL LOW (ref 6–23)
CO2: 29 mEq/L (ref 19–32)
Chloride: 106 mEq/L (ref 96–112)
GFR calc Af Amer: >60 mL/min (ref >60)
Glucose, Bld: 84 mg/dL (ref 70–99)
Potassium: 3 mEq/L — ABNORMAL LOW (ref 3.5–5.1)

## 2010-11-02 MED ORDER — POTASSIUM CHLORIDE CRYS ER 20 MEQ PO TBCR
40.0000 meq | EXTENDED_RELEASE_TABLET | Freq: Once | ORAL | Status: AC
  Administered 2010-11-02: 40 meq via ORAL
  Filled 2010-11-02: qty 2

## 2010-11-02 MED ORDER — NONFORMULARY OR COMPOUNDED ITEM: 1.0000 | Freq: Three times a day (TID) | Status: DC

## 2010-11-02 MED ORDER — ZOLPIDEM TARTRATE 10 MG PO TABS: 10.0000 mg | ORAL_TABLET | Freq: Every evening | ORAL | Status: DC | PRN

## 2010-11-02 MED ORDER — ALPRAZOLAM 0.5 MG PO TABS: 0.5000 mg | ORAL_TABLET | Freq: Four times a day (QID) | ORAL | Status: DC | PRN

## 2010-11-02 MED ORDER — POTASSIUM CHLORIDE CRYS ER 20 MEQ PO TBCR: 40.0000 meq | EXTENDED_RELEASE_TABLET | Freq: Two times a day (BID) | ORAL | Status: DC

## 2010-11-02 MED ORDER — ACETAMINOPHEN 325 MG PO TABS: 650.0000 mg | ORAL_TABLET | Freq: Four times a day (QID) | ORAL | Status: AC | PRN

## 2010-11-02 NOTE — Progress Notes (Signed)
Subjective: Since I last evaluated the patient , she has no vomiting. No nausea since 330pm yesterday. BMs: 3 times watery mixed with little bitty pieces. No abd pain. Appetite: good. Tolerating diet.  Objective: Vital signs in last 24 hours: Temp:  [97.7 F (36.5 C)-98.7 F (37.1 C)] 98.4 F (36.9 C) (07/14 0600) Pulse Rate:  [74-95] 74  (07/14 0600) Resp:  [16-24] 24  (07/14 0600) BP: (116-141)/(70-85) 135/78 mmHg (07/14 0600) SpO2:  [95 %-98 %] 95 % (07/14 0600) Last BM Date: 11/02/10  Intake/Output from previous day: 07/13 0701 - 07/14 0700 In: 3960.4 [P.O.:1440; I.V.:2110.4; IV Piggyback:410] Out: 3750 [Urine:3550; Stool:200] Intake/Output this shift: I/O this shift: In: 240 [P.O.:240] Out: -   General appearance: alert, cooperative, appears older than stated age, cachectic, combative, cyanotic, delirious, distracted, fatigued, flushed, icteric and no distress ABD: bs PRESENT, NT/ND Lab Results:  [facility] 11/01/10 0516  WBC 6.6  HGB 12.7  HCT 35.5*  PLT 254   BMET  Basename 11/02/10 0542 11/01/10 1239 11/01/10 0516  NA 142 144 144  K 3.0* 3.7 2.5*  CL 106 106 105  CO2 29 32 30  GLUCOSE 84 90 92  BUN 3* 3* <3*  CREATININE 0.53 0.58 0.61  CALCIUM 8.4 8.9 8.6   LFT No results found for this basename: PROT,ALBUMIN,AST,ALT,ALKPHOS,BILITOT,BILIDIR,IBILI in the last 72 hours PT/INR No results found for this basename: LABPROT:2,INR:2 in the last 72 hours Hepatitis Panel No results found for this basename: HEPBSAG,HCVAB,HEPAIGM,HEPBIGM in the last 72 hours C-Diff No results found for this basename: CDIFFTOX:3 in the last 72 hours Fecal Lactopherrin No results found for this basename: FECLLACTOFRN in the last 72 hours  Studies/Results: No results found.  Medications: I have reviewed the patient's current medications.  Assessment/Plan: 65 YO FEMALE ADMITTED WITH abd pain, diarrhea, nausea, vomtiing, and jerking. Now resolved. Sx most likely 2o to exacerbation  of gastroparesis and/or IBS, less liely self-limited viral illness.   PLAN: 1. DOMPERIDONE QAC AND HS. 2. OPV W/ DR. Jena Gauss IN 1 MONTH. 3. USE MIRALAX IF YOU CONSTIPATION AND HYOSCYAMINE IF SHE DIARRHEA AND ABD CRAMPS.  LOS: 2 days   [facility] 11/02/2010, 11:05 AM

## 2010-11-02 NOTE — Discharge Summary (Signed)
Tracey Mccullough, Tracey Mccullough               ACCOUNT NO.:  [id number]  MEDICAL RECORD NO.:  [id number]  LOCATION:  A331                          FACILITY:  [facility]  PHYSICIAN:  Alysiah Suppa L. Juanetta Gosling, M.D.DATE OF BIRTH:  1946-04-20  DATE OF ADMISSION:  10/31/2010 DATE OF DISCHARGE:  LH                              DISCHARGE SUMMARY   She was admitted with hypokalemia probably on the bases of a exacerbation of gastroparesis, nausea, vomiting, etc.  She had been in the emergency room, came back to the emergency room with more symptoms and was found to be hypokalemic which was markedly low.  Her exam showed that she appeared to be in moderate distress.  Her chest was fairly clear.  Heart regular.  Abdomen soft.  Her potassium was repleted.  She had consultation with GI and by the time of discharge, her potassium was up to 3 and although it was not fully repleted, she was given 40 mEq of KCl at the time of discharge and is discharged home.  Please see the discharge medication reconciliation for medications.  She will follow in my office in 4 days to have a potassium level done.     Cecelia Graciano L. Juanetta Gosling, M.D.     ELH/MEDQ  D:  11/02/2010  T:  11/02/2010  Job:  [id number]

## 2010-11-02 NOTE — Progress Notes (Signed)
D/c instructions reviewed with patient. Verbalized understanding. Pt dc'd to home with family.  Schonewitz, Candelaria Stagers 11/02/2010

## 2010-11-02 NOTE — Progress Notes (Signed)
Subjective: [name] was admitted with abdominal discomfort nausea vomiting and hypokalemia. She is much improved and says she wants to go home. Her potassium is somewhat low this morning but I think she can be discharged and replace her potassium orally now that she's not so nauseated.  Objective: Vital signs in last 24 hours: Temp:  [97.7 F (36.5 C)-98.7 F (37.1 C)] 98.4 F (36.9 C) (07/14 0600) Pulse Rate:  [74-95] 74  (07/14 0600) Resp:  [16-24] 24  (07/14 0600) BP: (116-141)/(70-85) 135/78 mmHg (07/14 0600) SpO2:  [95 %-98 %] 95 % (07/14 0600) Weight change:  Last BM Date: 11/02/10  Intake/Output from previous day: 07/13 0701 - 07/14 0700 In: 3960.4 [P.O.:1440; I.V.:2110.4; IV Piggyback:410] Out: 3750 [Urine:3550; Stool:200]  PHYSICAL EXAM General appearance: alert and no distress Resp: clear to auscultation bilaterally Cardio: regular rate and rhythm, S1, S2 normal, no murmur, click, rub or gallop GI: soft, non-tender; bowel sounds normal; no masses,  no organomegaly Extremities: extremities normal, atraumatic, no cyanosis or edema  Lab Results:  [facility] 11/01/10 0516  WBC 6.6  HGB 12.7  HCT 35.5*  PLT 254   BMET  Basename 11/02/10 0542 11/01/10 1239  NA 142 144  K 3.0* 3.7  CL 106 106  CO2 29 32  GLUCOSE 84 90  BUN 3* 3*  CREATININE 0.53 0.58  CALCIUM 8.4 8.9    Studies/Results: No results found.  Medications:  Scheduled:   . dipyridamole-aspirin  1 capsule Oral BID  . enoxaparin  40 mg Subcutaneous Q24H  . nicotine  21 mg Transdermal Daily  . NONFORMULARY/COMPOUNDED ITEM 1 each  1 each Oral TID AC & HS  . pantoprazole (PROTONIX) IV  40 mg Intravenous Q24H  . potassium chloride  10 mEq Intravenous Q1 Hr x 4  . potassium chloride  40 mEq Oral BID  . rOPINIRole  1 mg Oral QHS  . rosuvastatin  20 mg Oral Daily  . sodium chloride      . tiotropium  18 mcg Inhalation Daily  . DISCONTD: sodium chloride   Intravenous Once  . DISCONTD: NON  FORMULARY 10 mg  10 mg Oral TID WC & HS  . DISCONTD: NON FORMULARY 10 mg  10 mg Oral TID WC & HS  . DISCONTD: NON FORMULARY 10 mg  10 mg Oral TID WC & HS    Assesment: She is much improved since she wants to go home. Her potassium is still somewhat low but will be replaced as an outpatient. Principal Problem:  *Hypokalemia Active Problems:  GERD, SEVERE  Gastroparesis  HIATAL HERNIA  NAUSEA  Abdominal pain, epigastric  COPD (chronic obstructive pulmonary disease)  CVA (cerebral vascular accident)    Plan: Discharge home today please see discharge summary for details    LOS: 2 days   Journey Ratterman L 11/02/2010, 10:59 AM

## 2010-11-04 ENCOUNTER — Ambulatory Visit (HOSPITAL_COMMUNITY): Payer: Medicaid Other | Attending: Pulmonary Disease

## 2010-11-04 NOTE — Telephone Encounter (Signed)
Patient has appointment on 11/26/10 with KJ.

## 2010-11-05 ENCOUNTER — Other Ambulatory Visit: Payer: Self-pay

## 2010-11-05 MED ORDER — ONDANSETRON HCL 4 MG PO TABS: 4.0000 mg | ORAL_TABLET | Freq: Every day | ORAL | Status: DC | PRN

## 2010-11-06 LAB — BASIC METABOLIC PANEL
Calcium: 10.3 mg/dL
Sodium: 137 mmol/L (ref 137–147)

## 2010-11-06 LAB — STOOL CULTURE

## 2010-11-26 ENCOUNTER — Encounter: Payer: Self-pay | Admitting: Urgent Care

## 2010-11-26 ENCOUNTER — Telehealth: Payer: Self-pay | Admitting: Urgent Care

## 2010-11-26 ENCOUNTER — Ambulatory Visit (INDEPENDENT_AMBULATORY_CARE_PROVIDER_SITE_OTHER): Payer: Medicaid Other | Admitting: Urgent Care

## 2010-11-26 DIAGNOSIS — K3184 Gastroparesis: Secondary | ICD-10-CM

## 2010-11-26 DIAGNOSIS — R1013 Epigastric pain: Secondary | ICD-10-CM

## 2010-11-26 NOTE — Telephone Encounter (Signed)
Discussed w/ Dr Jena Gauss Pt advised to remain on GP Step 2 diet & current dose of domperidone 10mg  QID Use phenergan prn sparingly if possible Call if any further problems Needs OV 6 mo for FU

## 2010-11-26 NOTE — Assessment & Plan Note (Signed)
resolved 

## 2010-11-26 NOTE — Progress Notes (Signed)
Referring Provider: Fredirick Maudlin, MD Primary Care Physician:  Fredirick Maudlin, MD Primary Gastroenterologist:  Dr. Jena Gauss  Chief Complaint  Patient presents with  . follow up from hosp    feeling better now  . Nausea    HPI:  Tracey Mccullough is a 65 y.o. female here for follow up for gastroparesis.  Recent hospitalization for hypokalemia.  7/18 Dr Juanetta Gosling checked K and was normal per pt.  C/o daily nausea.  Rare vomiting,  Phenergan seems to help q AM & sometimes after supper.  Denies abdominal pain or diarrhea.  Taking miralax 17 grams daily for constipation & this is working well.  Appetite ok.  Denies fatigue.  Taking domperidone 10mg  QID.  Denies any side effects.  Appetite ok.  Wt stable.    Has been evaluated at [facility]  Past Medical History  Diagnosis Date  . Hypertension   . Arthritis   . COPD (chronic obstructive pulmonary disease)   . Hypercholesterolemia   . Restless leg syndrome   . Stroke   . Acid reflux   . Irritable bowel syndrome (IBS)   . Gastroparesis     INTOLERANT TO REGLAN; On Domperidone  . Depression   . GERD (gastroesophageal reflux disease)   . Schatzki's ring     EGD 1/12  . Hypokalemia 11/01/10    requiring admission    Past Surgical History  Procedure Date  . Cholecystectomy 1978  . Tubal ligation 1972  . Carpal tunnel release     bilateral  . Fracture surgery 2011    right arm  . Esophagogastroduodenoscopy 04/2010    Schatzki ring s/p dilation, minimal retained food. Versed 7/Demeral 150/Phenergan12.5.  . Kidney stone surgery 2008  . Colonoscopy 09/2006    hemorrhoids    Current Outpatient Prescriptions  Medication Sig Dispense Refill  . amLODipine-benazepril (LOTREL) 10-20 MG per capsule Take 1 capsule by mouth daily.       Marland Kitchen dipyridamole-aspirin (AGGRENOX) 25-200 MG per 12 hr capsule Take 1 capsule by mouth 2 (two) times daily.       Marland Kitchen OVER THE COUNTER MEDICATION Take 10 mg by mouth 4 (four) times daily. Medication is Domperidone  taken before each meal and at bedtime       . pantoprazole (PROTONIX) 40 MG tablet Take 40 mg by mouth 2 (two) times daily.       . potassium chloride SA (K-DUR,KLOR-CON) 20 MEQ tablet Take 2 tablets (40 mEq total) by mouth 2 (two) times daily.  60 tablet  12  . rOPINIRole (REQUIP) 1 MG tablet Take 1 mg by mouth at bedtime.        . rosuvastatin (CRESTOR) 20 MG tablet Take 20 mg by mouth daily.        Marland Kitchen tiotropium (SPIRIVA) 18 MCG inhalation capsule Place 18 mcg into inhaler and inhale daily.         Allergies as of 11/26/2010 - Review Complete 11/26/2010  Allergen Reaction Noted  . Metoclopramide hcl    . Zofran  11/26/2010  . Penicillins      Review of Systems: Gen: Denies any fever, chills, sweats, anorexia, fatigue, weakness, malaise, weight loss, and sleep disorder CV: Denies chest pain, angina, palpitations, syncope, orthopnea, PND, peripheral edema, and claudication. Resp: Denies dyspnea at rest, dyspnea with exercise, cough, sputum, wheezing, coughing up blood, and pleurisy. GI: Denies vomiting blood, jaundice, and fecal incontinence.   Denies dysphagia or odynophagia. Derm: Denies rash, itching, dry skin, hives, moles, warts, or unhealing  ulcers.  Psych: Denies depression, anxiety, memory loss, suicidal ideation, hallucinations, paranoia, and confusion. Heme: Denies bruising, bleeding, and enlarged lymph nodes.  Physical Exam: BP 132/80  Pulse 106  Temp(Src) 97.8 F (36.6 C) (Tympanic)  Ht 5\' 3"  (1.6 m)  Wt 168 lb (76.204 kg)  BMI 29.76 kg/m2 General:   Alert,  Well-developed, well-nourished, pleasant and cooperative in NAD Head:  Normocephalic and atraumatic. Eyes:  Sclera clear, no icterus.   Conjunctiva pink. Mouth:  No deformity or lesions, dentition normal. Neck:  Supple; no masses or thyromegaly. Heart:  Regular rate and rhythm; no murmurs, clicks, rubs,  or gallops. Abdomen:  Soft, nontender and nondistended. No masses, hepatosplenomegaly or hernias noted. Normal  bowel sounds, without guarding, and without rebound.   Msk:  Symmetrical without gross deformities. Normal posture. Pulses:  Normal pulses noted. Extremities:  Without clubbing or edema. Neurologic:  Alert and  oriented x4;  grossly normal neurologically. Skin:  Intact without significant lesions or rashes. Cervical Nodes:  No significant cervical adenopathy. Psych:  Alert and cooperative. Normal mood and affect.

## 2010-11-26 NOTE — Patient Instructions (Signed)
I will talk w/ Dr Jena Gauss about increasing your domperidone & call you. For now, continue your 10mg  dose  Continue protonix 40mg  BID

## 2010-11-26 NOTE — Assessment & Plan Note (Addendum)
Improvement on domperidone, but remains with early AM nausea & occasional nausea in the evenings.  Rare vomiting.  Discussed w/ Dr Jena Gauss.  Continue current dose & prn phenergan, along with Step 2 gastroparesis diet.  Pt agrees w/ plan.  Continue BID PPI

## 2010-11-27 NOTE — Progress Notes (Signed)
Cc to PCP 

## 2010-12-10 NOTE — Telephone Encounter (Signed)
Reminder in epic to follow up in 6 months °

## 2011-02-25 ENCOUNTER — Telehealth: Payer: Self-pay | Admitting: Internal Medicine

## 2011-02-25 NOTE — Telephone Encounter (Signed)
Pt came by front office window to let me know she has gotten married and wanted me to update her insurance and name change. She is also almost out of her donparadon (?). I told pt that the nurse had left for the day and I would let her know and that she would call her with any questions. [phone]

## 2011-02-26 NOTE — Telephone Encounter (Signed)
RX done. 

## 2011-02-26 NOTE — Telephone Encounter (Signed)
Routed to refill box 

## 2011-02-27 NOTE — Telephone Encounter (Signed)
Pt aware. Informed her that [place] pharmacy was not filling domperidone rx's anymore. Advised her that she can have it filled at [facility] or [place] pharmacy or she can find a pharmacy closer to her that compounds medications. Pt asked for it to be mailed to her and she would check and see if any pharmacies close to her will fill it, if not she will bring it to [place] to have it filled.

## 2011-02-28 ENCOUNTER — Ambulatory Visit (HOSPITAL_COMMUNITY): Payer: BC Managed Care – PPO

## 2011-03-05 ENCOUNTER — Ambulatory Visit (HOSPITAL_COMMUNITY): Payer: BC Managed Care – PPO

## 2011-03-10 ENCOUNTER — Ambulatory Visit (HOSPITAL_COMMUNITY): Admission: RE | Admit: 2011-03-10 | Payer: BC Managed Care – PPO | Source: Ambulatory Visit

## 2011-03-12 ENCOUNTER — Ambulatory Visit (HOSPITAL_COMMUNITY)
Admission: RE | Admit: 2011-03-12 | Discharge: 2011-03-12 | Disposition: A | Discharge status: Home / Self Care | Payer: BC Managed Care – PPO | Source: Ambulatory Visit | Attending: Pulmonary Disease | Admitting: Pulmonary Disease

## 2011-03-12 DIAGNOSIS — Z8673 Personal history of transient ischemic attack (TIA), and cerebral infarction without residual deficits: Secondary | ICD-10-CM | POA: Insufficient documentation

## 2011-03-12 DIAGNOSIS — R609 Edema, unspecified: Secondary | ICD-10-CM | POA: Insufficient documentation

## 2011-03-12 DIAGNOSIS — I369 Nonrheumatic tricuspid valve disorder, unspecified: Secondary | ICD-10-CM

## 2011-03-12 DIAGNOSIS — F17200 Nicotine dependence, unspecified, uncomplicated: Secondary | ICD-10-CM | POA: Insufficient documentation

## 2011-03-12 NOTE — Progress Notes (Signed)
*  PRELIMINARY RESULTS* Echocardiogram 2D Echocardiogram has been performed.  Tracey Mccullough 03/12/2011, 9:17 AM

## 2011-03-20 ENCOUNTER — Other Ambulatory Visit: Payer: Self-pay | Admitting: Internal Medicine

## 2011-04-03 ENCOUNTER — Encounter: Payer: Self-pay | Admitting: Urgent Care

## 2011-04-03 ENCOUNTER — Telehealth: Payer: Self-pay | Admitting: Gastroenterology

## 2011-04-03 ENCOUNTER — Ambulatory Visit (INDEPENDENT_AMBULATORY_CARE_PROVIDER_SITE_OTHER): Payer: BC Managed Care – PPO | Admitting: Urgent Care

## 2011-04-03 DIAGNOSIS — R51 Headache: Secondary | ICD-10-CM

## 2011-04-03 DIAGNOSIS — R519 Headache, unspecified: Secondary | ICD-10-CM | POA: Insufficient documentation

## 2011-04-03 DIAGNOSIS — K5900 Constipation, unspecified: Secondary | ICD-10-CM

## 2011-04-03 DIAGNOSIS — R110 Nausea: Secondary | ICD-10-CM

## 2011-04-03 DIAGNOSIS — R143 Flatulence: Secondary | ICD-10-CM

## 2011-04-03 DIAGNOSIS — K219 Gastro-esophageal reflux disease without esophagitis: Secondary | ICD-10-CM

## 2011-04-03 DIAGNOSIS — R141 Gas pain: Secondary | ICD-10-CM

## 2011-04-03 DIAGNOSIS — K7689 Other specified diseases of liver: Secondary | ICD-10-CM

## 2011-04-03 DIAGNOSIS — K3184 Gastroparesis: Secondary | ICD-10-CM

## 2011-04-03 DIAGNOSIS — Z800 Family history of malignant neoplasm of digestive organs: Secondary | ICD-10-CM

## 2011-04-03 DIAGNOSIS — K760 Fatty (change of) liver, not elsewhere classified: Secondary | ICD-10-CM

## 2011-04-03 LAB — TSH: TSH: 5.593 u[IU]/mL — ABNORMAL HIGH (ref 0.350–4.500)

## 2011-04-03 LAB — CBC WITH DIFFERENTIAL/PLATELET
Basophils Relative: 1 % (ref 0–1)
HCT: 40.3 % (ref 36.0–46.0)
Hemoglobin: 14 g/dL (ref 12.0–15.0)
Lymphocytes Relative: 41 % (ref 12–46)
MCHC: 34.7 g/dL (ref 30.0–36.0)
MCV: 89.2 fL (ref 78.0–100.0)
Monocytes Absolute: 0.7 10*3/uL (ref 0.1–1.0)
Monocytes Relative: 11 % (ref 3–12)
Neutro Abs: 2.7 10*3/uL (ref 1.7–7.7)

## 2011-04-03 LAB — COMPREHENSIVE METABOLIC PANEL
Albumin: 4.6 g/dL (ref 3.5–5.2)
BUN: 7 mg/dL (ref 6–23)
Calcium: 9.7 mg/dL (ref 8.4–10.5)
Chloride: 100 mEq/L (ref 96–112)
Glucose, Bld: 99 mg/dL (ref 70–99)
Potassium: 3.9 mEq/L (ref 3.5–5.3)

## 2011-04-03 MED ORDER — HYOSCYAMINE SULFATE 0.125 MG PO TABS: 0.1250 mg | ORAL_TABLET | Freq: Three times a day (TID) | ORAL | Status: DC

## 2011-04-03 NOTE — Progress Notes (Signed)
Cc to PCP 

## 2011-04-03 NOTE — Progress Notes (Signed)
Referring Provider: Fredirick Maudlin, MD Primary Care Physician:  Fredirick Maudlin, MD Primary Gastroenterologist:  Dr. Jena Gauss  Chief Complaint  Patient presents with  . Abdominal Pain  . Nausea  . Emesis    HPI:  Eating small amts, gaining weight.  Has been on domperidone for gastroparesis.  Intolerant of reglan due to tardive dyskinesia side effects.  Intolerant of EES.  Vomiting 2-3 times per week.  Severe nausea all day long.  Waking up with nausea & vomiting between 1-2am.  Using gastroparesis diet.  Lots of soup.  Off potassium, now normal per Dr Juanetta Gosling office about 45 mo ago.  Lots of bloating.  Has not had recent PAP or pelvic exam.  BM normal once daily.  Taking Miralax 17 grams daily as needing.  Taking domperidone 10mg  QID.  Denies any side effects.  Appetite ok.  Wt stable.  Fatty liver with normal LFTs 08/2010.  Daily headaches 9/10.  Pantoprazole 40mg  BID.    10/2010 CT A/P->1. Small amount of pelvic free fluid, which is nonspecific. No focal inflammatory process or abscess identified.  2. Hepatic steatosis.  Has been evaluated at [facility]  Past Medical History  Diagnosis Date  . Hypertension   . Arthritis   . COPD (chronic obstructive pulmonary disease)   . Hypercholesterolemia   . Restless leg syndrome   . Stroke   . Acid reflux   . Irritable bowel syndrome (IBS)   . Gastroparesis     INTOLERANT TO REGLAN; On Domperidone  . Depression   . GERD (gastroesophageal reflux disease)   . Schatzki's ring     EGD 1/12  . Hypokalemia 11/01/10    requiring admission    Past Surgical History  Procedure Date  . Cholecystectomy 1978  . Tubal ligation 1972  . Carpal tunnel release     bilateral  . Fracture surgery 2011    right arm  . Esophagogastroduodenoscopy 04/2010    Schatzki ring s/p dilation, minimal retained food. Versed 7/Demeral 150/Phenergan12.5.  . Kidney stone surgery 2008  . Colonoscopy 09/2006    hemorrhoids    Current Outpatient Prescriptions    Medication Sig Dispense Refill  . ALPRAZolam (XANAX) 1 MG tablet Take 1 mg by mouth at bedtime as needed.       Marland Kitchen amLODipine-benazepril (LOTREL) 10-20 MG per capsule Take 1 capsule by mouth daily.       Marland Kitchen dipyridamole-aspirin (AGGRENOX) 25-200 MG per 12 hr capsule Take 1 capsule by mouth 2 (two) times daily.       . hyoscyamine (LEVSIN, ANASPAZ) 0.125 MG tablet Take 1 tablet (0.125 mg total) by mouth 4 (four) times daily -  with meals and at bedtime.  90 tablet  2  . OVER THE COUNTER MEDICATION Take 10 mg by mouth 4 (four) times daily. Medication is Domperidone taken before each meal and at bedtime       . pantoprazole (PROTONIX) 40 MG tablet TAKE 1 TABLET BY MOUTH TWICE A DAY  60 tablet  5  . promethazine (PHENERGAN) 25 MG tablet Take 25 mg by mouth every 6 (six) hours as needed.       Marland Kitchen rOPINIRole (REQUIP) 1 MG tablet Take 1 mg by mouth at bedtime.        . rosuvastatin (CRESTOR) 20 MG tablet Take 20 mg by mouth daily.        Marland Kitchen tiotropium (SPIRIVA) 18 MCG inhalation capsule Place 18 mcg into inhaler and inhale daily.       Marland Kitchen  potassium chloride SA (K-DUR,KLOR-CON) 20 MEQ tablet Take 2 tablets (40 mEq total) by mouth 2 (two) times daily.  60 tablet  12    Allergies as of 04/03/2011 - Review Complete 04/03/2011  Allergen Reaction Noted  . Metoclopramide hcl    . Zofran  11/26/2010  . Penicillins    Review of Systems: See HPI, otherwise negative ROS  Physical Exam: BP 110/71  Pulse 102  Temp(Src) 98.4 F (36.9 C) (Temporal)  Ht 5\' 3"  (1.6 m)  Wt 184 lb 3.2 oz (83.553 kg)  BMI 32.63 kg/m2 General:   Alert,  Well-developed, well-nourished, pleasant and cooperative in NAD.   Head:  Normocephalic and atraumatic.HOH. Eyes:  Sclera clear, no icterus.   Conjunctiva pink. Mouth:  No deformity or lesions.. Neck:  Supple; no masses or thyromegaly. Heart:  Regular rate and rhythm; no murmurs, clicks, rubs,  or gallops. Abdomen:  RUQ scar.  Soft, nontender and nondistended. No masses,Liver 2  FB RCM. No hernias noted. Normal bowel sounds, without guarding, and without rebound.   Msk:  Symmetrical without gross deformities. Normal posture. Pulses:  Normal pulses noted. Extremities:  Without clubbing or edema. Neurologic:  Alert and  oriented x4;  grossly normal neurologically. Skin:  Intact without significant lesions or rashes.  Cervical Nodes:  No significant cervical adenopathy. Psych:  Alert and cooperative. Normal mood and affect.

## 2011-04-03 NOTE — Telephone Encounter (Signed)
GYN Referral faxed to [facility]

## 2011-04-03 NOTE — Patient Instructions (Addendum)
You need a pelvic exam & PAP with the gynecologist or your primary care provider Begin ALIGN, Restora or Probiotic of choice Use phenergan as needed for nausea Go get your labs Instructions for fatty liver: Recommend 1-2# weight loss per week until ideal body weight through exercise & diet. Low fat/cholesterol diet. Gradually increase exercise from 15 min daily up to 1 hr per day 5 days/week. Limit alcohol use.  Cholesterol Control Diet Cholesterol levels in your body are determined significantly by your diet. Cholesterol levels may also be related to heart disease. The following material helps to explain this relationship and discusses what you can do to help keep your heart healthy. Not all cholesterol is bad. Low-density lipoprotein (LDL) cholesterol is the "bad" cholesterol. It may cause fatty deposits to build up inside your arteries. High-density lipoprotein (HDL) cholesterol is "good." It helps to remove the "bad" LDL cholesterol from your blood. Cholesterol is a very important risk factor for heart disease. Other risk factors are high blood pressure, smoking, stress, heredity, and weight. The heart muscle gets its supply of blood through the coronary arteries. If your LDL cholesterol is high and your HDL cholesterol is low, you are at risk for having fatty deposits build up in your coronary arteries. This leaves less room through which blood can flow. Without sufficient blood and oxygen, the heart muscle cannot function properly and you may feel chest pains (angina pectoris). When a coronary artery closes up entirely, a part of the heart muscle may die, causing a heart attack (myocardial infarction). CHECKING CHOLESTEROL When your caregiver sends your blood to a lab to be analyzed for cholesterol, a complete lipid (fat) profile may be done. With this test, the total amount of cholesterol and levels of LDL and HDL are determined. Triglycerides are a type of fat that circulates in the blood and  can also be used to determine heart disease risk. The list below describes what the numbers should be: Test: Total Cholesterol.  Less than 200 mg/dl.  Test: LDL "bad cholesterol."  Less than 100 mg/dl.   Less than 70 mg/dl if you are at very high risk of a heart attack or sudden cardiac death.  Test: HDL "good cholesterol."  Greater than 50 mg/dl for women.   Greater than 40 mg/dl for men.  Test: Triglycerides.  Less than 150 mg/dl.  CONTROLLING CHOLESTEROL WITH DIET Although exercise and lifestyle factors are important, your diet is key. That is because certain foods are known to raise cholesterol and others to lower it. The goal is to balance foods for their effect on cholesterol and more importantly, to replace saturated and trans fat with other types of fat, such as monounsaturated fat, polyunsaturated fat, and omega-3 fatty acids. On average, a person should consume no more than 15 to 17 g of saturated fat daily. Saturated and trans fats are considered "bad" fats, and they will raise LDL cholesterol. Saturated fats are primarily found in animal products such as meats, butter, and cream. However, that does not mean you need to sacrifice all your favorite foods. Today, there are good tasting, low-fat, low-cholesterol substitutes for most of the things you like to eat. Choose low-fat or nonfat alternatives. Choose round or loin cuts of red meat, since these types of cuts are lowest in fat and cholesterol. Chicken (without the skin), fish, veal, and ground [place] breast are excellent choices. Eliminate fatty meats, such as hot dogs and salami. Even shellfish have little or no saturated fat. Have a  3 oz (85 g) portion when you eat lean meat, poultry, or fish. Trans fats are also called "partially hydrogenated oils." They are oils that have been scientifically manipulated so that they are solid at room temperature resulting in a longer shelf life and improved taste and texture of foods in which  they are added. Trans fats are found in stick margarine, some tub margarines, cookies, crackers, and baked goods.  When baking and cooking, oils are an excellent substitute for butter. The monounsaturated oils are especially beneficial since it is believed they lower LDL and raise HDL. The oils you should avoid entirely are saturated tropical oils, such as coconut and palm.  Remember to eat liberally from food groups that are naturally free of saturated and trans fat, including fish, fruit, vegetables, beans, grains (barley, rice, couscous, bulgur wheat), and pasta (without cream sauces).  IDENTIFYING FOODS THAT LOWER CHOLESTEROL  Soluble fiber may lower your cholesterol. This type of fiber is found in fruits such as apples, vegetables such as broccoli, potatoes, and carrots, legumes such as beans, peas, and lentils, and grains such as barley. Foods fortified with plant sterols (phytosterol) may also lower cholesterol. You should eat at least 2 g per day of these foods for a cholesterol lowering effect.  Read package labels to identify low-saturated fats, trans fats free, and low-fat foods at the supermarket. Select cheeses that have only 2 to 3 g saturated fat per ounce. Use a heart-healthy tub margarine that is free of trans fats or partially hydrogenated oil. When buying baked goods (cookies, crackers), avoid partially hydrogenated oils. Breads and muffins should be made from whole grains (whole-wheat or whole oat flour, instead of "flour" or "enriched flour"). Buy non-creamy canned soups with reduced salt and no added fats.  FOOD PREPARATION TECHNIQUES  Never deep-fry. If you must fry, either stir-fry, which uses very little fat, or use non-stick cooking sprays. When possible, broil, bake, or roast meats, and steam vegetables. Instead of dressing vegetables with butter or margarine, use lemon and herbs, applesauce and cinnamon (for squash and sweet potatoes), nonfat yogurt, salsa, and low-fat dressings  for salads.  LOW-SATURATED FAT / LOW-FAT FOOD SUBSTITUTES Meats / Saturated Fat (g)  Avoid: Steak, marbled (3 oz/85 g) / 11 g   Choose: Steak, lean (3 oz/85 g) / 4 g   Avoid: Hamburger (3 oz/85 g) / 7 g   Choose: Hamburger, lean (3 oz/85 g) / 5 g   Avoid: Ham (3 oz/85 g) / 6 g   Choose: Ham, lean cut (3 oz/85 g) / 2.4 g   Avoid: Chicken, with skin, dark meat (3 oz/85 g) / 4 g   Choose: Chicken, skin removed, dark meat (3 oz/85 g) / 2 g   Avoid: Chicken, with skin, light meat (3 oz/85 g) / 2.5 g   Choose: Chicken, skin removed, light meat (3 oz/85 g) / 1 g  Dairy / Saturated Fat (g)  Avoid: Whole milk (1 cup) / 5 g   Choose: Low-fat milk, 2% (1 cup) / 3 g   Choose: Low-fat milk, 1% (1 cup) / 1.5 g   Choose: Skim milk (1 cup) / 0.3 g   Avoid: Hard cheese (1 oz/28 g) / 6 g   Choose: Skim milk cheese (1 oz/28 g) / 2 to 3 g   Avoid: Cottage cheese, 4% fat (1 cup) / 6.5 g   Choose: Low-fat cottage cheese, 1% fat (1 cup) / 1.5 g   Avoid: Ice cream (1  cup) / 9 g   Choose: Sherbet (1 cup) / 2.5 g   Choose: Nonfat frozen yogurt (1 cup) / 0.3 g   Choose: Frozen fruit bar / trace   Avoid: Whipped cream (1 tbs) / 3.5 g   Choose: Nondairy whipped topping (1 tbs) / 1 g  Condiments / Saturated Fat (g)  Avoid: Mayonnaise (1 tbs) / 2 g   Choose: Low-fat mayonnaise (1 tbs) / 1 g   Avoid: Butter (1 tbs) / 7 g   Choose: Extra light margarine (1 tbs) / 1 g   Avoid: Coconut oil (1 tbs) / 11.8 g   Choose: Olive oil (1 tbs) / 1.8 g   Choose: Corn oil (1 tbs) / 1.7 g   Choose: Safflower oil (1 tbs) / 1.2 g   Choose: Sunflower oil (1 tbs) / 1.4 g   Choose: Soybean oil (1 tbs) / 2.4 g   Choose: Canola oil (1 tbs) / 1 g  Document Released: 04/07/2005 Document Revised: 12/18/2010 Document Reviewed: 09/26/2010 [facility] Patient Information 2012 [place], [place].

## 2011-04-03 NOTE — Assessment & Plan Note (Signed)
Recommend 1-2# weight loss per week until ideal body weight through exercise & diet. Low fat/cholesterol diet. Gradually increase exercise from 15 min daily up to 1 hr per day 5 days/week. Limit alcohol use.  Cholesterol Control Diet

## 2011-04-03 NOTE — Assessment & Plan Note (Signed)
Most likely secondary to gastroparesis and chronic narcotics. Daily headaches all with severe nausea warrants further evaluation with noncontrast head CT to rule out central etiology. If benign, referral to neurology.

## 2011-04-03 NOTE — Assessment & Plan Note (Signed)
Supportive measures for her gastroparesis as options are limited.  Part of dyskinesia secondary to Reglan. Intolerant of EES. Unavailability of domperidone which she was on this recently. Resume gastroparesis diet when symptoms are severe. Use phenergan as needed for nausea Recheck potassium (CMP) Instructions for fatty liver:

## 2011-04-03 NOTE — Assessment & Plan Note (Addendum)
Chronic.  Differentials include irritable bowel syndrome, celiac disease, small bowel bacterial overgrowth, or GYN etiology.  TTG IgA Referral to [facility] for pelvic exam. Begin ALIGN, Restora or Probiotic of choice

## 2011-04-04 NOTE — Progress Notes (Signed)
Quick Note:  Please call patient. Her lab work looks fine other than abnormal thyroid function. She will need an appointment with her PCP to discuss this further. We are awaiting the celiac study and head CT. CC: Fredirick Maudlin, MD Thanks ______

## 2011-04-07 ENCOUNTER — Telehealth: Payer: Self-pay

## 2011-04-07 NOTE — Progress Notes (Signed)
Results Cc to PCP  

## 2011-04-07 NOTE — Telephone Encounter (Signed)
Pt called- gave her results from bloodwork and asked her to call Dr. Juanetta Gosling for appt about her thyroid. Pt stated she would call today and make appt. Pt stated she is following gastroparesis diet and taking phenergan that Dr.Hawkins has given her and she still has a lot of nausea all day and said it seams to be getting worse. Please advise.

## 2011-04-07 NOTE — Telephone Encounter (Signed)
Pt said she cannot take zofran because she had an allergic reaction to it.

## 2011-04-07 NOTE — Telephone Encounter (Signed)
OK, needs PCP FU for thyroid 1st ASAP Await Head CT Gastroparesis step 1 diet for 2 days, then advance Avoid narcotic pain pills

## 2011-04-07 NOTE — Telephone Encounter (Signed)
Stop Reglan. Trial of zofran 4mg  ODT q 8 hrs prn N/V, #40, 0RF If no better, needs RMR only appt Thanks

## 2011-04-09 ENCOUNTER — Ambulatory Visit (HOSPITAL_COMMUNITY): Admission: RE | Admit: 2011-04-09 | Payer: BC Managed Care – PPO | Source: Ambulatory Visit

## 2011-04-10 NOTE — Telephone Encounter (Signed)
Pt was a no show for her head ct.

## 2011-04-10 NOTE — Telephone Encounter (Signed)
Noted  

## 2011-05-01 ENCOUNTER — Telehealth: Payer: Self-pay | Admitting: Gastroenterology

## 2011-05-01 NOTE — Telephone Encounter (Signed)
Pt called asking for something nausea and has questions about lab work- please call @ [phone]

## 2011-05-01 NOTE — Telephone Encounter (Signed)
Spoke with pt- Dr. Juanetta Gosling had not received her blood work. I cc'd tsh to Dr. Juanetta Gosling. Pt also has some increased nausea, we discussed the fact that she could not take zofran or reglan. She is taking phenergan.  Went over gastroparesis diet in length with her again and asked to make sure she went to Dr. Juanetta Gosling about her thyroid. Pt agreed.

## 2011-05-08 ENCOUNTER — Telehealth: Payer: Self-pay | Admitting: Gastroenterology

## 2011-05-08 NOTE — Telephone Encounter (Signed)
Received fax from [facility]- Pt was a no show for ov on 01/17

## 2011-05-26 ENCOUNTER — Encounter: Payer: Self-pay | Admitting: Internal Medicine

## 2011-06-10 ENCOUNTER — Telehealth: Payer: Self-pay | Admitting: Gastroenterology

## 2011-06-10 NOTE — Telephone Encounter (Signed)
Received fax from [facility]- pt was a no show for ov on 02/18

## 2011-06-13 NOTE — Telephone Encounter (Signed)
Noted  

## 2011-06-16 ENCOUNTER — Telehealth: Payer: Self-pay

## 2011-06-16 NOTE — Telephone Encounter (Signed)
She needs office visit next available with either extenders or Dr. Jena Gauss to discuss her compliance/GERD.

## 2011-06-16 NOTE — Telephone Encounter (Signed)
Pt called- c/o nausea, abd pain and bloating. Pt said the nausea has been worse the past week. Asked pt if she was following gastroparesis diet. She said she was at times but not all the time. Advised pt that she needed to follow the diet sheet when she was having these types of problems.  Pt voiced understanding. Pt is taking phenergan but it makes her sleepy. She is hurting in the middle of her stomach and states she is bloating when she eats. Again advised pt about her gastroparesis diet. Informed her that I would discuss with a provider and get back with her.   Please note: pt has no showed for last 2 appts with [facility] that we referred her to and no showed for head CT that we ordered.

## 2011-06-17 NOTE — Telephone Encounter (Signed)
Susan, please schedule appt.  

## 2011-06-18 NOTE — Telephone Encounter (Signed)
Pt is aware of OV on 3/8 at 8 with RMR

## 2011-06-27 ENCOUNTER — Telehealth: Payer: Self-pay | Admitting: Internal Medicine

## 2011-06-27 ENCOUNTER — Ambulatory Visit: Payer: BC Managed Care – PPO | Admitting: Internal Medicine

## 2011-06-27 NOTE — Telephone Encounter (Signed)
Pt was a no show

## 2011-07-03 ENCOUNTER — Encounter: Payer: Self-pay | Admitting: Internal Medicine

## 2011-07-16 ENCOUNTER — Ambulatory Visit: Payer: BC Managed Care – PPO | Admitting: Gastroenterology

## 2011-08-11 ENCOUNTER — Encounter: Payer: Self-pay | Admitting: Gastroenterology

## 2011-08-11 ENCOUNTER — Ambulatory Visit (INDEPENDENT_AMBULATORY_CARE_PROVIDER_SITE_OTHER): Payer: BC Managed Care – PPO | Admitting: Gastroenterology

## 2011-08-11 ENCOUNTER — Telehealth: Payer: Self-pay | Admitting: Gastroenterology

## 2011-08-11 ENCOUNTER — Ambulatory Visit: Payer: BC Managed Care – PPO | Admitting: Gastroenterology

## 2011-08-11 ENCOUNTER — Other Ambulatory Visit: Payer: Self-pay | Admitting: Gastroenterology

## 2011-08-11 VITALS — BP 141/81 | HR 97 | Temp 98.1°F | Ht 63.0 in | Wt 184.1 lb

## 2011-08-11 DIAGNOSIS — R1310 Dysphagia, unspecified: Secondary | ICD-10-CM | POA: Insufficient documentation

## 2011-08-11 DIAGNOSIS — R143 Flatulence: Secondary | ICD-10-CM

## 2011-08-11 DIAGNOSIS — R1013 Epigastric pain: Secondary | ICD-10-CM

## 2011-08-11 DIAGNOSIS — K3184 Gastroparesis: Secondary | ICD-10-CM

## 2011-08-11 DIAGNOSIS — K7689 Other specified diseases of liver: Secondary | ICD-10-CM

## 2011-08-11 DIAGNOSIS — R141 Gas pain: Secondary | ICD-10-CM

## 2011-08-11 DIAGNOSIS — R110 Nausea: Secondary | ICD-10-CM

## 2011-08-11 DIAGNOSIS — Z800 Family history of malignant neoplasm of digestive organs: Secondary | ICD-10-CM

## 2011-08-11 DIAGNOSIS — K760 Fatty (change of) liver, not elsewhere classified: Secondary | ICD-10-CM

## 2011-08-11 DIAGNOSIS — R140 Abdominal distension (gaseous): Secondary | ICD-10-CM

## 2011-08-11 MED ORDER — PEG-KCL-NACL-NASULF-NA ASC-C 100 G PO SOLR: 1.0000 | Freq: Once | ORAL | Status: DC

## 2011-08-11 NOTE — Assessment & Plan Note (Addendum)
66 year old with abdominal bloating. Appears chronic in nature. Pt continues to gain weight, was 158 Nov 2011, now in the 180s. Has not followed up with GYN as requested. No prior hysterectomy. Due for TCS secondary to 2 family members with colon cancer, last TCS 2008. Celiac negative. CT July 2012 with fatty liver. Need to proceed with TCS first, then consider HBT for SBBO.

## 2011-08-11 NOTE — Assessment & Plan Note (Signed)
Brother in his 19s, sister recently diagnosed in her 66s. Pt's last TCS in June 2008. No signs of rectal bleeding. Hx of IBS with alternating constipation/diarrhea. Only vague symptoms of abdominal bloating. Proceed with TCS in near future, consider HBT in future after TCS.   Proceed with TCS with Dr. Jena Gauss in near future: the risks, benefits, and alternatives have been discussed with the patient in detail. The patient states understanding and desires to proceed. PHENERGAN 12.5 ON CALL

## 2011-08-11 NOTE — Telephone Encounter (Signed)
UGI & BPE scheduled for 04/24 @ 9:30- pt aware to check in at 9:15 and NPO after midnight

## 2011-08-11 NOTE — Assessment & Plan Note (Signed)
Intolerant to Reglan, EES, Zofran. Domperidone in past, which we now do not prescribe. Taking Phenergan currently, states not working anymore. Constant nausea, very rare vomiting. Wt stable from last visit in December. Following gastroparesis diet but "miserable".   Consider Marinol. Will d/w Dr. Jena Gauss. Continue gastroparesis diet. May need tertiary referral

## 2011-08-11 NOTE — Assessment & Plan Note (Signed)
Rare, intermittent, vague. Recent EGD on file, Jan 2012. May be from IBS. Proceeding with UGI/BPE due to dysphagia. If any abnormalities noted, likely will have EGD.

## 2011-08-11 NOTE — Patient Instructions (Signed)
We have set you up for an x-ray to see why you are having problems swallowing.   We have referred you back to the gynecologist for a PAP smear.   We have also set you up for a colonoscopy with Dr. Jena Gauss in the near future. We may need to add an upper endoscopy at the same time, after review of xray.

## 2011-08-11 NOTE — Telephone Encounter (Signed)
Patient showed up 30 minutes late and AS agreed to go ahead and see her.

## 2011-08-11 NOTE — Telephone Encounter (Signed)
Pt was a no show

## 2011-08-11 NOTE — Assessment & Plan Note (Signed)
Last LFTs normal Dec 2012. Recheck in June 2013.

## 2011-08-11 NOTE — Assessment & Plan Note (Signed)
Hx of Schatzki's ring s/p dilation in Jan 2012. Vague symptoms currently, symptoms X 1 week. Will assess with UGI/BPE first and add EGD to TCS if necessary.

## 2011-08-11 NOTE — Progress Notes (Signed)
Referring Provider: Fredirick Maudlin, MD Primary Care Physician:  Fredirick Maudlin, MD, MD Primary Gastroenterologist: Dr. Jena Gauss   Chief Complaint  Patient presents with  . Nausea  . Abdominal Pain    HPI:   Hx of gastroparesis, fatty liver, GERD, IBS, chronic abdominal bloating. Last seen in Dec 2012. No-showed CT head scheduled at last visit (for chronic headaches, nausea, to assess central process), also no-showed appt at [facility] (for abdominal bloating, need for update of PAP, etc). No-showed appt with [place] 3/8. Showed up 30 minutes late today. Never completed appt at [facility]. States they told her she didn't have an appt, but we have documentation where they called our office stating she "no-showed".   Unable to tolerate Reglan due to side effects. Allergic to Zofran. Intolerant of EES. Wt 184 in Dec. Same today. Was 158 in Nov 2011. On Synthroid now for hypothyroidism. States on gastroparesis diet, abdomen feels very swollen. Feels 9 month pregnant. Miserable. Reports bloating, discomfort. Notes last week intermittent epigastric discomfort. Phenergan not working anymore. Mostly severe nausea, but rare vomiting. Occasional constipation, takes Miralax, then diarrhea. Some days 5 BMs. Pasty, some form. Not pure diarrhea. No blood in stool.   Notes recurrence of dysphagia for past week. Hx of Schatzki's ring s/p last dilation in Jan 2012. Tried to wean down to 1 Protonix daily, noted GERD exacerbation. BID works well.  Sister recently diagnosed with colon cancer. Brother hx of colon cancer. Last TCS 2008. Needs screening now.     Labs at last visit Dec 2012: normal LFTs, elevated TSH (sent to Dr. Juanetta Gosling), CBC normal, negative celiac.   CT July 2012: IMPRESSION:  1. Small amount of pelvic free fluid, which is nonspecific. No  focal inflammatory process or abscess identified.  2. Hepatic steatosis.   Past Medical History  Diagnosis Date  . Hypertension   . Arthritis   . COPD  (chronic obstructive pulmonary disease)   . Hypercholesterolemia   . Restless leg syndrome   . Stroke   . Acid reflux   . Irritable bowel syndrome (IBS)   . Gastroparesis     INTOLERANT TO REGLAN; On Domperidone  . Depression   . GERD (gastroesophageal reflux disease)   . Schatzki's ring     EGD 1/12  . Hypokalemia 11/01/10    requiring admission    Past Surgical History  Procedure Date  . Cholecystectomy 1978  . Tubal ligation 1972  . Carpal tunnel release     bilateral  . Fracture surgery 2011    right arm  . Esophagogastroduodenoscopy 04/2010    Schatzki ring s/p dilation, minimal retained food. Versed 7/Demeral 150/Phenergan12.5.  . Kidney stone surgery 2008  . Colonoscopy 09/2006    hemorrhoids    Current Outpatient Prescriptions  Medication Sig Dispense Refill  . amLODipine-benazepril (LOTREL) 10-20 MG per capsule Take 1 capsule by mouth daily.       Marland Kitchen dipyridamole-aspirin (AGGRENOX) 25-200 MG per 12 hr capsule Take 1 capsule by mouth 2 (two) times daily.       Marland Kitchen levothyroxine (SYNTHROID, LEVOTHROID) 50 MCG tablet Take 50 mcg by mouth daily.       . pantoprazole (PROTONIX) 40 MG tablet TAKE 1 TABLET BY MOUTH TWICE A DAY  60 tablet  5  . Probiotic Product (PROBIOTIC FORMULA PO) Take by mouth daily.      . promethazine (PHENERGAN) 25 MG tablet Take 25 mg by mouth every 6 (six) hours as needed.       Marland Kitchen  rOPINIRole (REQUIP) 1 MG tablet Take 1 mg by mouth at bedtime.        . rosuvastatin (CRESTOR) 20 MG tablet Take 20 mg by mouth daily.        Marland Kitchen tiotropium (SPIRIVA) 18 MCG inhalation capsule Place 18 mcg into inhaler and inhale daily.       Marland Kitchen ALPRAZolam (XANAX) 1 MG tablet Take 1 mg by mouth at bedtime as needed.       . hyoscyamine (LEVSIN, ANASPAZ) 0.125 MG tablet Take 1 tablet (0.125 mg total) by mouth 4 (four) times daily -  with meals and at bedtime.  90 tablet  2  . OVER THE COUNTER MEDICATION Take 10 mg by mouth 4 (four) times daily. Medication is Domperidone taken  before each meal and at bedtime       . peg 3350 powder (MOVIPREP) SOLR Take 1 kit (100 g total) by mouth once. As directed Please purchase 1 Fleets enema to use with the prep  1 kit  0  . potassium chloride SA (K-DUR,KLOR-CON) 20 MEQ tablet Take 2 tablets (40 mEq total) by mouth 2 (two) times daily.  60 tablet  12    Allergies as of 08/11/2011 - Review Complete 08/11/2011  Allergen Reaction Noted  . Metoclopramide hcl    . Zofran  11/26/2010  . Penicillins      Family History  Problem Relation Age of Onset  . Cancer Father     ?leukemia  . Cancer Sister   . Colon cancer Brother 17  . Colon cancer Sister 57    recently diagnosed    History   Social History  . Marital Status: Married    Spouse Name: N/A    Number of Children: 3  . Years of Education: N/A   Occupational History  .     Social History Main Topics  . Smoking status: Current Everyday Smoker -- 2.0 packs/day    Types: Cigarettes  . Smokeless tobacco: None  . Alcohol Use: No  . Drug Use: No  . Sexually Active: Not Currently   Other Topics Concern  . None   Social History Narrative  . None    Review of Systems: Gen: Denies fever, chills, anorexia. Denies fatigue, weakness, weight loss.  CV: Denies chest pain, palpitations, syncope, peripheral edema, and claudication. Resp: Denies dyspnea at rest, cough, wheezing, coughing up blood, and pleurisy. GI: Denies vomiting blood, jaundice, and fecal incontinence.   Denies dysphagia or odynophagia. Derm: Denies rash, itching, dry skin Psych: Denies depression, anxiety, memory loss, confusion. No homicidal or suicidal ideation.  Heme: Denies bruising, bleeding, and enlarged lymph nodes.  Physical Exam: BP 141/81  Pulse 97  Temp(Src) 98.1 F (36.7 C) (Temporal)  Ht 5\' 3"  (1.6 m)  Wt 184 lb 1.6 oz (83.507 kg)  BMI 32.61 kg/m2 General:   Alert and oriented. No distress noted. Pleasant and cooperative. HOH.  Head:  Normocephalic and atraumatic. Eyes:   Conjuctiva clear without scleral icterus. Mouth:  Oral mucosa pink and moist.  Heart:  S1, S2 present without murmurs, rubs, or gallops. Regular rate and rhythm. Abdomen:  +BS, soft, non-tender and non-distended. No rebound or guarding. Liver edge noted 2 fingerbreadths from right costal margin.  Obese.  Msk:  Symmetrical without gross deformities. Normal posture. Extremities:  Without edema. Neurologic:  Alert and  oriented x4;  grossly normal neurologically. Skin:  Intact without significant lesions or rashes. Psych:  Alert and cooperative. Normal mood and affect.

## 2011-08-12 ENCOUNTER — Telehealth: Payer: Self-pay | Admitting: Gastroenterology

## 2011-08-12 NOTE — Progress Notes (Signed)
Faxed to PCP

## 2011-08-12 NOTE — Telephone Encounter (Signed)
Referral and notes faxed to [facility]

## 2011-08-13 ENCOUNTER — Ambulatory Visit (HOSPITAL_COMMUNITY)
Admission: RE | Admit: 2011-08-13 | Discharge: 2011-08-13 | Disposition: A | Discharge status: Home / Self Care | Payer: BC Managed Care – PPO | Source: Ambulatory Visit | Attending: Gastroenterology | Admitting: Gastroenterology

## 2011-08-13 ENCOUNTER — Other Ambulatory Visit: Payer: Self-pay | Admitting: Gastroenterology

## 2011-08-13 DIAGNOSIS — R110 Nausea: Secondary | ICD-10-CM

## 2011-08-13 DIAGNOSIS — R1310 Dysphagia, unspecified: Secondary | ICD-10-CM | POA: Insufficient documentation

## 2011-08-13 DIAGNOSIS — R140 Abdominal distension (gaseous): Secondary | ICD-10-CM

## 2011-08-13 DIAGNOSIS — R143 Flatulence: Secondary | ICD-10-CM | POA: Insufficient documentation

## 2011-08-13 DIAGNOSIS — R142 Eructation: Secondary | ICD-10-CM | POA: Insufficient documentation

## 2011-08-13 DIAGNOSIS — R141 Gas pain: Secondary | ICD-10-CM | POA: Insufficient documentation

## 2011-08-17 NOTE — Progress Notes (Signed)
Quick Note:  There is delayed passage of barium tablet at GE junction. No identifiable cause. As pt is noted dysphagia, let's add EGD to TCS already scheduled.  Please have her chew food well, take small bites.  No evidence of esophageal dysmotility.  Last EGD was in Jan 2012. ______

## 2011-08-18 ENCOUNTER — Other Ambulatory Visit: Payer: Self-pay | Admitting: Gastroenterology

## 2011-08-18 DIAGNOSIS — R109 Unspecified abdominal pain: Secondary | ICD-10-CM

## 2011-08-21 MED ORDER — DRONABINOL 2.5 MG PO CAPS: 2.5000 mg | ORAL_CAPSULE | Freq: Two times a day (BID) | ORAL | Status: AC

## 2011-08-21 NOTE — Progress Notes (Signed)
Addended by: Nira Retort on: 08/21/2011 09:13 AM   Modules accepted: Orders

## 2011-08-21 NOTE — Progress Notes (Addendum)
Discussed with Dr. Jena Gauss. We will trial Marinol to assist with gastroparesis symptoms.  Tell her this should help with nausea, but it is very important she follow the gastroparesis diet as well. I know she is already doing this, as she has stated.   I have printed the prescription. It is unable to be e-prescribed.

## 2011-08-28 ENCOUNTER — Encounter (HOSPITAL_COMMUNITY): Payer: Self-pay | Admitting: Pharmacy Technician

## 2011-08-29 MED ORDER — SODIUM CHLORIDE 0.45 % IV SOLN: Freq: Once | INTRAVENOUS | Status: DC

## 2011-09-01 ENCOUNTER — Encounter (HOSPITAL_COMMUNITY): Payer: Self-pay | Admitting: *Deleted

## 2011-09-01 ENCOUNTER — Ambulatory Visit (HOSPITAL_COMMUNITY)
Admission: RE | Admit: 2011-09-01 | Discharge: 2011-09-01 | Disposition: A | Discharge status: Home / Self Care | Payer: BC Managed Care – PPO | Source: Ambulatory Visit | Attending: Internal Medicine | Admitting: Internal Medicine

## 2011-09-01 ENCOUNTER — Ambulatory Visit (HOSPITAL_COMMUNITY)
Admission: RE | Admit: 2011-09-01 | Payer: BC Managed Care – PPO | Source: Ambulatory Visit | Admitting: Internal Medicine

## 2011-09-01 ENCOUNTER — Encounter (HOSPITAL_COMMUNITY)
Admission: RE | Disposition: A | Discharge status: Home / Self Care | Payer: Self-pay | Source: Ambulatory Visit | Attending: Internal Medicine

## 2011-09-01 ENCOUNTER — Encounter (HOSPITAL_COMMUNITY): Admission: RE | Payer: Self-pay | Source: Ambulatory Visit

## 2011-09-01 DIAGNOSIS — K2960 Other gastritis without bleeding: Secondary | ICD-10-CM

## 2011-09-01 DIAGNOSIS — K649 Unspecified hemorrhoids: Secondary | ICD-10-CM

## 2011-09-01 DIAGNOSIS — Z1211 Encounter for screening for malignant neoplasm of colon: Secondary | ICD-10-CM | POA: Insufficient documentation

## 2011-09-01 DIAGNOSIS — R933 Abnormal findings on diagnostic imaging of other parts of digestive tract: Secondary | ICD-10-CM

## 2011-09-01 DIAGNOSIS — K449 Diaphragmatic hernia without obstruction or gangrene: Secondary | ICD-10-CM | POA: Insufficient documentation

## 2011-09-01 DIAGNOSIS — R109 Unspecified abdominal pain: Secondary | ICD-10-CM

## 2011-09-01 DIAGNOSIS — J449 Chronic obstructive pulmonary disease, unspecified: Secondary | ICD-10-CM | POA: Insufficient documentation

## 2011-09-01 DIAGNOSIS — K222 Esophageal obstruction: Secondary | ICD-10-CM

## 2011-09-01 DIAGNOSIS — D126 Benign neoplasm of colon, unspecified: Secondary | ICD-10-CM | POA: Insufficient documentation

## 2011-09-01 DIAGNOSIS — R1310 Dysphagia, unspecified: Secondary | ICD-10-CM | POA: Insufficient documentation

## 2011-09-01 DIAGNOSIS — J4489 Other specified chronic obstructive pulmonary disease: Secondary | ICD-10-CM | POA: Insufficient documentation

## 2011-09-01 DIAGNOSIS — E7800 Pure hypercholesterolemia, unspecified: Secondary | ICD-10-CM | POA: Insufficient documentation

## 2011-09-01 DIAGNOSIS — K2940 Chronic atrophic gastritis without bleeding: Secondary | ICD-10-CM | POA: Insufficient documentation

## 2011-09-01 DIAGNOSIS — R112 Nausea with vomiting, unspecified: Secondary | ICD-10-CM

## 2011-09-01 DIAGNOSIS — Z79899 Other long term (current) drug therapy: Secondary | ICD-10-CM | POA: Insufficient documentation

## 2011-09-01 DIAGNOSIS — Z800 Family history of malignant neoplasm of digestive organs: Secondary | ICD-10-CM

## 2011-09-01 DIAGNOSIS — I10 Essential (primary) hypertension: Secondary | ICD-10-CM | POA: Insufficient documentation

## 2011-09-01 DIAGNOSIS — K644 Residual hemorrhoidal skin tags: Secondary | ICD-10-CM | POA: Insufficient documentation

## 2011-09-01 HISTORY — PX: COLONOSCOPY: SHX174

## 2011-09-01 HISTORY — PX: ESOPHAGOGASTRODUODENOSCOPY: SHX1529

## 2011-09-01 HISTORY — DX: Unspecified hemorrhoids: K64.9

## 2011-09-01 SURGERY — COLONOSCOPY
Anesthesia: Moderate Sedation

## 2011-09-01 SURGERY — COLONOSCOPY WITH ESOPHAGOGASTRODUODENOSCOPY (EGD)
Anesthesia: Moderate Sedation

## 2011-09-01 MED ORDER — MEPERIDINE HCL 100 MG/ML IJ SOLN
INTRAMUSCULAR | Status: DC | PRN
  Administered 2011-09-01: 25 mg
  Administered 2011-09-01 (×3): 50 mg

## 2011-09-01 MED ORDER — STERILE WATER FOR IRRIGATION IR SOLN
Status: DC | PRN
  Administered 2011-09-01: 10:00:00

## 2011-09-01 MED ORDER — PROMETHAZINE HCL 25 MG/ML IJ SOLN
INTRAMUSCULAR | Status: AC
  Filled 2011-09-01: qty 1

## 2011-09-01 MED ORDER — MIDAZOLAM HCL 5 MG/5ML IJ SOLN
INTRAMUSCULAR | Status: DC | PRN
  Administered 2011-09-01: 1 mg via INTRAVENOUS
  Administered 2011-09-01 (×3): 2 mg via INTRAVENOUS

## 2011-09-01 MED ORDER — MIDAZOLAM HCL 5 MG/5ML IJ SOLN
INTRAMUSCULAR | Status: AC
  Filled 2011-09-01: qty 10

## 2011-09-01 MED ORDER — BUTAMBEN-TETRACAINE-BENZOCAINE 2-2-14 % EX AERO
INHALATION_SPRAY | CUTANEOUS | Status: DC | PRN
  Administered 2011-09-01: 2 via TOPICAL

## 2011-09-01 MED ORDER — SODIUM CHLORIDE 0.45 % IV SOLN
INTRAVENOUS | Status: DC | PRN
  Administered 2011-09-01: 50 mL/h via INTRAVENOUS

## 2011-09-01 MED ORDER — SODIUM CHLORIDE 0.9 % IJ SOLN
INTRAMUSCULAR | Status: AC
  Filled 2011-09-01: qty 20

## 2011-09-01 MED ORDER — MEPERIDINE HCL 100 MG/ML IJ SOLN
INTRAMUSCULAR | Status: AC
  Filled 2011-09-01: qty 2

## 2011-09-01 MED ORDER — PANTOPRAZOLE SODIUM 40 MG PO TBEC: 40.0000 mg | DELAYED_RELEASE_TABLET | Freq: Two times a day (BID) | ORAL | Status: DC

## 2011-09-01 NOTE — Op Note (Signed)
[facility] [address] [place], [place]  [address]  COLONOSCOPY PROCEDURE REPORT  PATIENT:  Tracey Mccullough, Tracey Mccullough  MR#:  [id number] BIRTHDATE:  1946-04-11, 65 yrs. old  GENDER:  female ENDOSCOPIST:  R. Roetta Sessions, MD FACP [facility] REF. BY:  Kari Baars, M.D. PROCEDURE DATE:  09/01/2011 PROCEDURE:  Ileocolonoscopy with snare polypectomy  INDICATIONS:  High risk screening colonoscopy  INFORMED CONSENT:  The risks, benefits, alternatives and imponderables including but not limited to bleeding, perforation as well as the possibility of a missed lesion have been reviewed. The potential for biopsy, lesion removal, etc. have also been discussed.  Questions have been answered.  All parties agreeable. Please see the history and physical in the medical record for more information.  MEDICATIONS:  Versed 7 mg IV and Demerol 175 mg IV 125 mg IV to augment conscious sedation. See EGD report  DESCRIPTION OF PROCEDURE:  After a digital rectal exam was performed, the EC-[id number] ([id number]) colonoscope was advanced from the anus through the rectum and colon to the area of the cecum, ileocecal valve and appendiceal orifice.  The cecum was deeply intubated.  These structures were well-seen and photographed for the record.  From the level of the cecum and ileocecal valve, the scope was slowly and cautiously withdrawn.  The mucosal surfaces were carefully surveyed utilizing scope tip deflection to facilitate fold flattening as needed.  The scope was pulled down into the rectum where a thorough examination including retroflexion was performed. <<PROCEDUREIMAGES>>  FINDINGS:  Adequate preparation. External hemorrhoids; normal rectum. 2 ascending colon polyps - the large proximal 8 mm in dimension. These were sessile lesions.; Remainder of the colon appeared normal. Normal distal 5 cm of terminal ileal mucosa.  THERAPEUTIC / DIAGNOSTIC MANEUVERS PERFORMED:   The above-mentioned polyps were  hot snare removed  COMPLICATIONS:  None  CECAL WITHDRAWAL TIME:  14 minutes  IMPRESSION:  External hemorrhoids. Colonic polyps-removed as described above  RECOMMENDATIONS: Followup on pathology.  ______________________________ R. Roetta Sessions, MD Caleen Essex  CC:  Kari Baars, M.D.  n. eSIGNED:   R. Roetta Sessions at 09/01/2011 10:51 AM  Philis Nettle, [phone]

## 2011-09-01 NOTE — Op Note (Signed)
[facility] [address] [place], [place]  [address]  ENDOSCOPY PROCEDURE REPORT  PATIENT:  Tracey Mccullough, Tracey Mccullough  MR#:  [id number] BIRTHDATE:  11/19/1945, 65 yrs. old  GENDER:  female  ENDOSCOPIST:  R. Roetta Sessions, MD Caleen Essex Referred by:  Kari Baars, M.D.  PROCEDURE DATE:  09/01/2011 PROCEDURE:  EGD with Elease Hashimoto dilation, gastric biopsy  Indications: Abnormal BPE. Esophageal dysphagia. Nausea and vomiting  INFORMED CONSENT:   The risks, benefits, limitations, alternatives and imponderables have been discussed.  The potential for biopsy, esophogeal dilation, etc. have also been reviewed.  Questions have been answered.  All parties agreeable.  Please see the history and physical in the medical record for more information.  MEDICATIONS: Phenergan 25 mg IV and Demerol 150 mg IV and Versed 6 mg IV in divided doses. Cetacaine spray.  DESCRIPTION OF PROCEDURE:   The EG-[id number] ([id number]) endoscope was introduced through the mouth and advanced to the second portion of the duodenum without difficulty or limitations.  The mucosal surfaces were surveyed very carefully during advancement of the scope and upon withdrawal.  Retroflexion view of the proximal stomach and esophagogastric junction was performed.  <<PROCEDUREIMAGES>>  FINDINGS:  Circumferential distal esophageal erosions; longest mucosal break -  1 cm extending up into the distal esophagus. Noncritical Schatzki's ring. Patent esophagus esophagus. EG junction easily traversed. Stomach with some minimal retained gastric contents and bile stained mucous  - easily washed out. Small hiatal hernia. Multiple antral erosions - large 5 mm in dimensions. No ulcer or infiltrating process. Pylorus patent and easily traversed. Examination bulb second portion revealed no abnormalities.  THERAPEUTIC / DIAGNOSTIC MANEUVERS PERFORMED:  7 French Maloney dilator passed easily to full insertion. A look back revealed probable  rupture of a cervical esophageal web and the Schatzki's ring was dilated nicely without apparent complication - minimal bleeding. Subsequently, biopsies for abnormal antrum were taken histology.  COMPLICATIONS:   None  IMPRESSION:   Cervical esophageal web and Schatzki's ring dilated as described above. Small hiatal hernia. Antral erosions-status post biopsy  RECOMMENDATIONS:  Increase Protonix to 40 mg orally twice daily. Followup on pathology. See colonoscopy report.  ______________________________ R. Roetta Sessions, MD Caleen Essex  CC:  n. eSIGNED:   R. Roetta Sessions at 09/01/2011 10:21 AM  Philis Nettle, [phone]

## 2011-09-01 NOTE — Discharge Instructions (Addendum)
Colonoscopy Discharge Instructions  Read the instructions outlined below and refer to this sheet in the next few weeks. These discharge instructions provide you with general information on caring for yourself after you leave the hospital. Your doctor may also give you specific instructions. While your treatment has been planned according to the most current medical practices available, unavoidable complications occasionally occur. If you have any problems or questions after discharge, call Dr. Jena Gauss at [phone]. ACTIVITY  You may resume your regular activity, but move at a slower pace for the next 24 hours.   Take frequent rest periods for the next 24 hours.   Walking will help get rid of the air and reduce the bloated feeling in your belly (abdomen).   No driving for 24 hours (because of the medicine (anesthesia) used during the test).    Do not sign any important legal documents or operate any machinery for 24 hours (because of the anesthesia used during the test).  NUTRITION  Drink plenty of fluids.   You may resume your normal diet as instructed by your doctor.   Begin with a light meal and progress to your normal diet. Heavy or fried foods are harder to digest and may make you feel sick to your stomach (nauseated).   Avoid alcoholic beverages for 24 hours or as instructed.  MEDICATIONS  You may resume your normal medications unless your doctor tells you otherwise.  WHAT YOU CAN EXPECT TODAY  Some feelings of bloating in the abdomen.   Passage of more gas than usual.   Spotting of blood in your stool or on the toilet paper.  IF YOU HAD POLYPS REMOVED DURING THE COLONOSCOPY:  No aspirin products for 7 days or as instructed.   No alcohol for 7 days or as instructed.   Eat a soft diet for the next 24 hours.  FINDING OUT THE RESULTS OF YOUR TEST Not all test results are available during your visit. If your test results are not back during the visit, make an appointment  with your caregiver to find out the results. Do not assume everything is normal if you have not heard from your caregiver or the medical facility. It is important for you to follow up on all of your test results.  SEEK IMMEDIATE MEDICAL ATTENTION IF:  You have more than a spotting of blood in your stool.   Your belly is swollen (abdominal distention).   You are nauseated or vomiting.   You have a temperature over 101.   You have abdominal pain or discomfort that is severe or gets worse throughout the day.   Polyp information provided. Further recommendations to follow pending review of pathology report   EGD Discharge instructions Please read the instructions outlined below and refer to this sheet in the next few weeks. These discharge instructions provide you with general information on caring for yourself after you leave the hospital. Your doctor may also give you specific instructions. While your treatment has been planned according to the most current medical practices available, unavoidable complications occasionally occur. If you have any problems or questions after discharge, please call your doctor. ACTIVITY  You may resume your regular activity but move at a slower pace for the next 24 hours.   Take frequent rest periods for the next 24 hours.   Walking will help expel (get rid of) the air and reduce the bloated feeling in your abdomen.   No driving for 24 hours (because of the anesthesia (medicine) used  during the test).   You may shower.   Do not sign any important legal documents or operate any machinery for 24 hours (because of the anesthesia used during the test).  NUTRITION  Drink plenty of fluids.   You may resume your normal diet.   Begin with a light meal and progress to your normal diet.   Avoid alcoholic beverages for 24 hours or as instructed by your caregiver.  MEDICATIONS  You may resume your normal medications unless your caregiver tells you  otherwise.  WHAT YOU CAN EXPECT TODAY  You may experience abdominal discomfort such as a feeling of fullness or "gas" pains.  FOLLOW-UP  Your doctor will discuss the results of your test with you.  SEEK IMMEDIATE MEDICAL ATTENTION IF ANY OF THE FOLLOWING OCCUR:  Excessive nausea (feeling sick to your stomach) and/or vomiting.   Severe abdominal pain and distention (swelling).   Trouble swallowing.   Temperature over 101 F (37.8 C).   Rectal bleeding or vomiting of blood.    Continue gastroparesis diet. Continue Marinol. Increase Protonix to 40 mg orally twice daily.  Further recommendations to follow pending review of pathology report.  Followup appointment with [place] in 4-6 weeks

## 2011-09-01 NOTE — H&P (View-Only) (Signed)
Referring Provider: Hawkins, Edward L, MD Primary Care Physician:  HAWKINS,EDWARD L, MD, MD Primary Gastroenterologist: Dr. Rourk   Chief Complaint  Patient presents with  . Nausea  . Abdominal Pain    HPI:   Hx of gastroparesis, fatty liver, GERD, IBS, chronic abdominal bloating. Last seen in Dec 2012. No-showed CT head scheduled at last visit (for chronic headaches, nausea, to assess central process), also no-showed appt at [facility] (for abdominal bloating, need for update of PAP, etc). No-showed appt with us 3/8. Showed up 30 minutes late today. Never completed appt at [facility]. States they told her she didn't have an appt, but we have documentation where they called our office stating she "no-showed".   Unable to tolerate Reglan due to side effects. Allergic to Zofran. Intolerant of EES. Wt 184 in Dec. Same today. Was 158 in Nov 2011. On Synthroid now for hypothyroidism. States on gastroparesis diet, abdomen feels very swollen. Feels 9 month pregnant. Miserable. Reports bloating, discomfort. Notes last week intermittent epigastric discomfort. Phenergan not working anymore. Mostly severe nausea, but rare vomiting. Occasional constipation, takes Miralax, then diarrhea. Some days 5 BMs. Pasty, some form. Not pure diarrhea. No blood in stool.   Notes recurrence of dysphagia for past week. Hx of Schatzki's ring s/p last dilation in Jan 2012. Tried to wean down to 1 Protonix daily, noted GERD exacerbation. BID works well.  Sister recently diagnosed with colon cancer. Brother hx of colon cancer. Last TCS 2008. Needs screening now.     Labs at last visit Dec 2012: normal LFTs, elevated TSH (sent to Dr. Hawkins), CBC normal, negative celiac.   CT July 2012: IMPRESSION:  1. Small amount of pelvic free fluid, which is nonspecific. No  focal inflammatory process or abscess identified.  2. Hepatic steatosis.   Past Medical History  Diagnosis Date  . Hypertension   . Arthritis   . COPD  (chronic obstructive pulmonary disease)   . Hypercholesterolemia   . Restless leg syndrome   . Stroke   . Acid reflux   . Irritable bowel syndrome (IBS)   . Gastroparesis     INTOLERANT TO REGLAN; On Domperidone  . Depression   . GERD (gastroesophageal reflux disease)   . Schatzki's ring     EGD 1/12  . Hypokalemia 11/01/10    requiring admission    Past Surgical History  Procedure Date  . Cholecystectomy 1978  . Tubal ligation 1972  . Carpal tunnel release     bilateral  . Fracture surgery 2011    right arm  . Esophagogastroduodenoscopy 04/2010    Schatzki ring s/p dilation, minimal retained food. Versed 7/Demeral 150/Phenergan12.5.  . Kidney stone surgery 2008  . Colonoscopy 09/2006    hemorrhoids    Current Outpatient Prescriptions  Medication Sig Dispense Refill  . amLODipine-benazepril (LOTREL) 10-20 MG per capsule Take 1 capsule by mouth daily.       . dipyridamole-aspirin (AGGRENOX) 25-200 MG per 12 hr capsule Take 1 capsule by mouth 2 (two) times daily.       . levothyroxine (SYNTHROID, LEVOTHROID) 50 MCG tablet Take 50 mcg by mouth daily.       . pantoprazole (PROTONIX) 40 MG tablet TAKE 1 TABLET BY MOUTH TWICE A DAY  60 tablet  5  . Probiotic Product (PROBIOTIC FORMULA PO) Take by mouth daily.      . promethazine (PHENERGAN) 25 MG tablet Take 25 mg by mouth every 6 (six) hours as needed.       .   rOPINIRole (REQUIP) 1 MG tablet Take 1 mg by mouth at bedtime.        . rosuvastatin (CRESTOR) 20 MG tablet Take 20 mg by mouth daily.        . tiotropium (SPIRIVA) 18 MCG inhalation capsule Place 18 mcg into inhaler and inhale daily.       . ALPRAZolam (XANAX) 1 MG tablet Take 1 mg by mouth at bedtime as needed.       . hyoscyamine (LEVSIN, ANASPAZ) 0.125 MG tablet Take 1 tablet (0.125 mg total) by mouth 4 (four) times daily -  with meals and at bedtime.  90 tablet  2  . OVER THE COUNTER MEDICATION Take 10 mg by mouth 4 (four) times daily. Medication is Domperidone taken  before each meal and at bedtime       . peg 3350 powder (MOVIPREP) SOLR Take 1 kit (100 g total) by mouth once. As directed Please purchase 1 Fleets enema to use with the prep  1 kit  0  . potassium chloride SA (K-DUR,KLOR-CON) 20 MEQ tablet Take 2 tablets (40 mEq total) by mouth 2 (two) times daily.  60 tablet  12    Allergies as of 08/11/2011 - Review Complete 08/11/2011  Allergen Reaction Noted  . Metoclopramide hcl    . Zofran  11/26/2010  . Penicillins      Family History  Problem Relation Age of Onset  . Cancer Father     ?leukemia  . Cancer Sister   . Colon cancer Brother 54  . Colon cancer Sister 67    recently diagnosed    History   Social History  . Marital Status: Married    Spouse Name: N/A    Number of Children: 3  . Years of Education: N/A   Occupational History  .     Social History Main Topics  . Smoking status: Current Everyday Smoker -- 2.0 packs/day    Types: Cigarettes  . Smokeless tobacco: None  . Alcohol Use: No  . Drug Use: No  . Sexually Active: Not Currently   Other Topics Concern  . None   Social History Narrative  . None    Review of Systems: Gen: Denies fever, chills, anorexia. Denies fatigue, weakness, weight loss.  CV: Denies chest pain, palpitations, syncope, peripheral edema, and claudication. Resp: Denies dyspnea at rest, cough, wheezing, coughing up blood, and pleurisy. GI: Denies vomiting blood, jaundice, and fecal incontinence.   Denies dysphagia or odynophagia. Derm: Denies rash, itching, dry skin Psych: Denies depression, anxiety, memory loss, confusion. No homicidal or suicidal ideation.  Heme: Denies bruising, bleeding, and enlarged lymph nodes.  Physical Exam: BP 141/81  Pulse 97  Temp(Src) 98.1 F (36.7 C) (Temporal)  Ht 5' 3" (1.6 m)  Wt 184 lb 1.6 oz (83.507 kg)  BMI 32.61 kg/m2 General:   Alert and oriented. No distress noted. Pleasant and cooperative. HOH.  Head:  Normocephalic and atraumatic. Eyes:   Conjuctiva clear without scleral icterus. Mouth:  Oral mucosa pink and moist.  Heart:  S1, S2 present without murmurs, rubs, or gallops. Regular rate and rhythm. Abdomen:  +BS, soft, non-tender and non-distended. No rebound or guarding. Liver edge noted 2 fingerbreadths from right costal margin.  Obese.  Msk:  Symmetrical without gross deformities. Normal posture. Extremities:  Without edema. Neurologic:  Alert and  oriented x4;  grossly normal neurologically. Skin:  Intact without significant lesions or rashes. Psych:  Alert and cooperative. Normal mood and affect.  

## 2011-09-01 NOTE — Interval H&P Note (Signed)
History and Physical Interval Note:  09/01/2011 9:40 AM  Tracey Mccullough  has presented today for surgery, with the diagnosis of abd pain and dysphagia  The various methods of treatment have been discussed with the patient and family. After consideration of risks, benefits and other options for treatment, the patient has consented to  Procedure(s) (LRB): COLONOSCOPY WITH ESOPHAGOGASTRODUODENOSCOPY (EGD) (N/A) as a surgical intervention .  The patients' history has been reviewed, patient examined, no change in status, stable for surgery.  I have reviewed the patients' chart and labs.  Questions were answered to the patient's satisfaction.     [name]

## 2011-09-02 ENCOUNTER — Other Ambulatory Visit (HOSPITAL_COMMUNITY)
Admission: RE | Admit: 2011-09-02 | Discharge: 2011-09-02 | Disposition: A | Discharge status: Home / Self Care | Payer: BC Managed Care – PPO | Source: Ambulatory Visit | Attending: Obstetrics and Gynecology | Admitting: Obstetrics and Gynecology

## 2011-09-02 DIAGNOSIS — Z01419 Encounter for gynecological examination (general) (routine) without abnormal findings: Secondary | ICD-10-CM | POA: Insufficient documentation

## 2011-09-03 ENCOUNTER — Other Ambulatory Visit: Payer: Self-pay | Admitting: Advanced Practice Midwife

## 2011-09-06 ENCOUNTER — Encounter: Payer: Self-pay | Admitting: Internal Medicine

## 2011-09-25 ENCOUNTER — Emergency Department (HOSPITAL_COMMUNITY)
Admission: EM | Admit: 2011-09-25 | Discharge: 2011-09-25 | Disposition: A | Discharge status: Home / Self Care | Payer: BC Managed Care – PPO | Attending: Emergency Medicine | Admitting: Emergency Medicine

## 2011-09-25 ENCOUNTER — Encounter (HOSPITAL_COMMUNITY): Payer: Self-pay

## 2011-09-25 DIAGNOSIS — Z8673 Personal history of transient ischemic attack (TIA), and cerebral infarction without residual deficits: Secondary | ICD-10-CM | POA: Insufficient documentation

## 2011-09-25 DIAGNOSIS — Z79899 Other long term (current) drug therapy: Secondary | ICD-10-CM | POA: Insufficient documentation

## 2011-09-25 DIAGNOSIS — E079 Disorder of thyroid, unspecified: Secondary | ICD-10-CM | POA: Insufficient documentation

## 2011-09-25 DIAGNOSIS — J4489 Other specified chronic obstructive pulmonary disease: Secondary | ICD-10-CM | POA: Insufficient documentation

## 2011-09-25 DIAGNOSIS — J449 Chronic obstructive pulmonary disease, unspecified: Secondary | ICD-10-CM | POA: Insufficient documentation

## 2011-09-25 DIAGNOSIS — K589 Irritable bowel syndrome without diarrhea: Secondary | ICD-10-CM | POA: Insufficient documentation

## 2011-09-25 DIAGNOSIS — F17200 Nicotine dependence, unspecified, uncomplicated: Secondary | ICD-10-CM | POA: Insufficient documentation

## 2011-09-25 DIAGNOSIS — I10 Essential (primary) hypertension: Secondary | ICD-10-CM | POA: Insufficient documentation

## 2011-09-25 DIAGNOSIS — Z8739 Personal history of other diseases of the musculoskeletal system and connective tissue: Secondary | ICD-10-CM | POA: Insufficient documentation

## 2011-09-25 DIAGNOSIS — K219 Gastro-esophageal reflux disease without esophagitis: Secondary | ICD-10-CM | POA: Insufficient documentation

## 2011-09-25 HISTORY — DX: Disorder of thyroid, unspecified: E07.9

## 2011-09-25 LAB — COMPREHENSIVE METABOLIC PANEL
AST: 53 U/L — ABNORMAL HIGH (ref 0–37)
Albumin: 4.4 g/dL (ref 3.5–5.2)
BUN: 7 mg/dL (ref 6–23)
Calcium: 10 mg/dL (ref 8.4–10.5)
Creatinine, Ser: 0.63 mg/dL (ref 0.50–1.10)
Total Protein: 7.8 g/dL (ref 6.0–8.3)

## 2011-09-25 LAB — CBC
HCT: 41.7 % (ref 36.0–46.0)
Hemoglobin: 14.8 g/dL (ref 12.0–15.0)
MCV: 86.3 fL (ref 78.0–100.0)
Platelets: 232 10*3/uL (ref 150–400)
RBC: 4.83 MIL/uL (ref 3.87–5.11)
WBC: 7.4 10*3/uL (ref 4.0–10.5)

## 2011-09-25 MED ORDER — IPRATROPIUM BROMIDE 0.02 % IN SOLN
0.5000 mg | Freq: Once | RESPIRATORY_TRACT | Status: AC
  Administered 2011-09-25: 0.5 mg via RESPIRATORY_TRACT
  Filled 2011-09-25: qty 2.5

## 2011-09-25 MED ORDER — PREDNISONE 10 MG PO TABS: 40.0000 mg | ORAL_TABLET | Freq: Every day | ORAL | Status: DC

## 2011-09-25 MED ORDER — POTASSIUM CHLORIDE CRYS ER 20 MEQ PO TBCR
40.0000 meq | EXTENDED_RELEASE_TABLET | Freq: Two times a day (BID) | ORAL | Status: DC
  Administered 2011-09-25: 40 meq via ORAL
  Filled 2011-09-25: qty 2

## 2011-09-25 MED ORDER — ALBUTEROL SULFATE (5 MG/ML) 0.5% IN NEBU
5.0000 mg | INHALATION_SOLUTION | Freq: Once | RESPIRATORY_TRACT | Status: AC
  Administered 2011-09-25: 5 mg via RESPIRATORY_TRACT
  Filled 2011-09-25: qty 0.5

## 2011-09-25 MED ORDER — PREDNISONE 20 MG PO TABS
60.0000 mg | ORAL_TABLET | Freq: Once | ORAL | Status: AC
  Administered 2011-09-25: 60 mg via ORAL
  Filled 2011-09-25: qty 3

## 2011-09-25 NOTE — ED Notes (Signed)
Pt has copd, been more sob since yesterday and weak, +nausea--everyday for weeks, denies any cp. Mild headache no fever.

## 2011-09-25 NOTE — ED Provider Notes (Signed)
History   This chart was scribed for Doug Sou, MD by Brooks Sailors. The patient was seen in room APA02/APA02. Patient's care was started at 1142.   CSN: [id number]  Arrival date & time 09/25/11  1142   First MD Initiated Contact with Patient 09/25/11 1211      Chief Complaint  Patient presents with  . Shortness of Breath    (Consider location/radiation/quality/duration/timing/severity/associated sxs/prior treatment) HPI Tracey Mccullough is a 66 y.o. female who presents to the Emergency Department complaining of moderate SOB onset last night 7 hours ago with associated nausea, weakness. Pt says it feels similar to her COPD. Pt says she couldn't talk so that's why she didn't call 911. Pt with history of thyroid, HTN, COPD, Stroke, GERD.   Pt is a smoker. Pt is on Spiriva at home. Treated herself with a spriva with improvement of breathing. Presently speech is most improved. Continues to complain of generalized weakness.  PCP Dr. Juanetta Gosling.   Past Medical History  Diagnosis Date  . Hypertension   . Arthritis   . COPD (chronic obstructive pulmonary disease)   . Hypercholesterolemia   . Restless leg syndrome   . Stroke   . Acid reflux   . Irritable bowel syndrome (IBS)   . Gastroparesis     INTOLERANT TO REGLAN; On Domperidone  . Depression   . GERD (gastroesophageal reflux disease)   . Schatzki's ring     EGD 1/12  . Hypokalemia 11/01/10    requiring admission  . Thyroid disease     Past Surgical History  Procedure Date  . Cholecystectomy 1978  . Tubal ligation 1972  . Carpal tunnel release     bilateral  . Fracture surgery 2011    right arm  . Esophagogastroduodenoscopy 04/2010    Schatzki ring s/p dilation, minimal retained food. Versed 7/Demeral 150/Phenergan12.5.  . Kidney stone surgery 2008  . Colonoscopy 09/2006    hemorrhoids    Family History  Problem Relation Age of Onset  . Cancer Father     ?leukemia  . Cancer Sister   . Colon cancer Brother 26    . Colon cancer Sister 22    recently diagnosed    History  Substance Use Topics  . Smoking status: Current Everyday Smoker -- 2.0 packs/day    Types: Cigarettes  . Smokeless tobacco: Not on file  . Alcohol Use: No    OB History    Grav Para Term Preterm Abortions TAB SAB Ect Mult Living                  Review of Systems  HENT: Negative.   Respiratory: Positive for shortness of breath.   Cardiovascular: Negative.   Gastrointestinal: Positive for nausea.  Musculoskeletal: Negative.   Skin: Negative.   Neurological: Positive for weakness.  Hematological: Negative.   Psychiatric/Behavioral: Negative.     Allergies  Metoclopramide hcl; Penicillins; and Zofran  Home Medications   Current Outpatient Rx  Name Route Sig Dispense Refill  . AMLODIPINE BESY-BENAZEPRIL HCL 10-20 MG PO CAPS Oral Take 1 capsule by mouth every morning.     . ASPIRIN-DIPYRIDAMOLE ER 25-200 MG PO CP12 Oral Take 1 capsule by mouth 2 (two) times daily.     Marland Kitchen HYDROCODONE-ACETAMINOPHEN 10-500 MG PO TABS Oral Take 1 tablet by mouth 3 (three) times daily as needed. For pain    . LEVOTHYROXINE SODIUM 50 MCG PO TABS Oral Take 50 mcg by mouth every morning.     Marland Kitchen  PANTOPRAZOLE SODIUM 40 MG PO TBEC Oral Take 1 tablet (40 mg total) by mouth 2 (two) times daily. 60 tablet 5  . PEG-KCL-NACL-NASULF-NA ASC-C 100 G PO SOLR Oral Take 1 kit (100 g total) by mouth once. As directed Please purchase 1 Fleets enema to use with the prep 1 kit 0  . PROBIOTIC FORMULA PO Oral Take 1 capsule by mouth daily.     Marland Kitchen ROPINIROLE HCL 1 MG PO TABS Oral Take 1 mg by mouth at bedtime.      Marland Kitchen TIOTROPIUM BROMIDE MONOHYDRATE 18 MCG IN CAPS Inhalation Place 18 mcg into inhaler and inhale every evening.       BP 144/63  Pulse 97  Temp(Src) 97.9 F (36.6 C) (Oral)  Resp 22  Ht 5\' 4"  (1.626 m)  Wt 183 lb (83.008 kg)  BMI 31.41 kg/m2  SpO2 96%  Physical Exam  Nursing note and vitals reviewed. Constitutional: She is oriented to  person, place, and time. She appears well-developed and well-nourished.  HENT:  Head: Normocephalic and atraumatic.  Eyes: Conjunctivae are normal. Pupils are equal, round, and reactive to light.  Neck: Neck supple. No tracheal deviation present. No thyromegaly present.  Cardiovascular: Normal rate.   No murmur heard. Abdominal: Soft. Bowel sounds are normal. She exhibits no distension. There is no tenderness.  Musculoskeletal: Normal range of motion. She exhibits no edema and no tenderness.  Neurological: She is alert and oriented to person, place, and time. Coordination normal.       Gait normal  Skin: Skin is warm and dry. No rash noted.  Psychiatric: She has a normal mood and affect.    ED Course  Procedures (including critical care time)  1216 Patient informed of current plan for treatment and evaluation and agrees with plan at this time. Patient to have breathing treatment and nausea medication.    Labs Reviewed - No data to display No results found. Results for orders placed during the hospital encounter of 09/25/11  COMPREHENSIVE METABOLIC PANEL      Component Value Range   Sodium 139  135 - 145 (mEq/L)   Potassium 3.4 (*) 3.5 - 5.1 (mEq/L)   Chloride 103  96 - 112 (mEq/L)   CO2 22  19 - 32 (mEq/L)   Glucose, Bld 102 (*) 70 - 99 (mg/dL)   BUN 7  6 - 23 (mg/dL)   Creatinine, Ser 1.61  0.50 - 1.10 (mg/dL)   Calcium 09.6  8.4 - 10.5 (mg/dL)   Total Protein 7.8  6.0 - 8.3 (g/dL)   Albumin 4.4  3.5 - 5.2 (g/dL)   AST 53 (*) 0 - 37 (U/L)   ALT 49 (*) 0 - 35 (U/L)   Alkaline Phosphatase 103  39 - 117 (U/L)   Total Bilirubin 0.2 (*) 0.3 - 1.2 (mg/dL)   GFR calc non Af Amer >90  >90 (mL/min)   GFR calc Af Amer >90  >90 (mL/min)  CBC      Component Value Range   WBC 7.4  4.0 - 10.5 (K/uL)   RBC 4.83  3.87 - 5.11 (MIL/uL)   Hemoglobin 14.8  12.0 - 15.0 (g/dL)   HCT 04.5  40.9 - 81.1 (%)   MCV 86.3  78.0 - 100.0 (fL)   MCH 30.6  26.0 - 34.0 (pg)   MCHC 35.5  30.0 - 36.0  (g/dL)   RDW 91.4  78.2 - 95.6 (%)   Platelets 232  150 - 400 (K/uL)   No  results found.   No diagnosis found. Spent 3-5 minutes councilliing pt on smoking cessation  Patient is breathing is improved and at baseline after treatment with nebulizer containing albuterol and ipratropium She remains alert appropriate Glasgow Coma Score 15 MDM  Case discussed with Dr. Juanetta Gosling. I feel that her ability to move or speak last night was likely secondary to anxiety He is in agreement. Plan stop smoking encouraged. Prescription prednisone  Patient has an albuterol inhaler which she is to use 2 puffs every 4 hours as needed for shortness of breath Smoking cessation encouraged Diagnosis#1 COPD exacerbation #2 tobacco abuse #3 hypokalemia #4 hypertension    I personally performed the services described in this documentation, which was scribed in my presence. The recorded information has been reviewed and considered.    Doug Sou, MD 09/25/11 [phone]

## 2011-09-25 NOTE — ED Notes (Addendum)
Patient ambulatory to restroom with steady gait.  Respiratory paged for breathing treatment.

## 2011-09-25 NOTE — ED Notes (Signed)
Respiratory at bedside.

## 2011-09-25 NOTE — Discharge Instructions (Signed)
Chronic Obstructive Pulmonary Disease Use your Proventil inhaler with spacer 2 puffs every 4 hours as needed for cough or shortness of breath. Return if needed more than every 4 hours or if her condition worsens for any reason. Call Dr. Juanetta Gosling to arrange an office visit to help you to stop smoking. You should get your blood pressure recheck within the next 3 weeks Chronic obstructive pulmonary disease (COPD) is a condition in which airflow from the lungs is restricted. The lungs can never return to normal, but there are measures you can take which will improve them and make you feel better. CAUSES   Smoking.   Exposure to secondhand smoke.   Breathing in irritants (pollution, cigarette smoke, strong smells, aerosol sprays, paint fumes).   History of lung infections.  TREATMENT  Treatment focuses on making you comfortable (supportive care). Your caregiver may prescribe medications (inhaled or pills) to help improve your breathing. HOME CARE INSTRUCTIONS   If you smoke, stop smoking.   Avoid exposure to smoke, chemicals, and fumes that aggravate your breathing.   Take antibiotic medicines as directed by your caregiver.   Avoid medicines that dry up your system and slow down the elimination of secretions (antihistamines and cough syrups). This decreases respiratory capacity and may lead to infections.   Drink enough water and fluids to keep your urine clear or pale yellow. This loosens secretions.   Use humidifiers at home and at your bedside if they do not make breathing difficult.   Receive all protective vaccines your caregiver suggests, especially pneumococcal and influenza.   Use home oxygen as suggested.   Stay active. Exercise and physical activity will help maintain your ability to do things you want to do.   Eat a healthy diet.  SEEK MEDICAL CARE IF:   You develop pus-like mucus (sputum).   Breathing is more labored or exercise becomes difficult to do.   You are  running out of the medicine you take for your breathing.  SEEK IMMEDIATE MEDICAL CARE IF:   You have a rapid heart rate.   You have agitation, confusion, tremors, or are in a stupor (family members may need to observe this).   It becomes difficult to breathe.   You develop chest pain.   You have a fever.  MAKE SURE YOU:   Understand these instructions.   Will watch your condition.   Will get help right away if you are not doing well or get worse.  Document Released: 01/15/2005 Document Revised: 03/27/2011 Document Reviewed: 06/07/2010 [facility] Patient Information 2012 [employer], [place].

## 2011-10-10 ENCOUNTER — Ambulatory Visit (INDEPENDENT_AMBULATORY_CARE_PROVIDER_SITE_OTHER): Payer: BC Managed Care – PPO | Admitting: Internal Medicine

## 2011-10-10 ENCOUNTER — Encounter: Payer: Self-pay | Admitting: Internal Medicine

## 2011-10-10 VITALS — BP 148/81 | HR 86 | Temp 97.4°F | Ht 64.0 in | Wt 182.0 lb

## 2011-10-10 DIAGNOSIS — R1310 Dysphagia, unspecified: Secondary | ICD-10-CM

## 2011-10-10 DIAGNOSIS — K3184 Gastroparesis: Secondary | ICD-10-CM

## 2011-10-10 NOTE — Patient Instructions (Addendum)
Benefiber 2 tablespoons daily  Miralax  1 capful at bedtime as needed for constipation  Gastroparesis diet  Digestive advantage for gas/bloat   Continue marinol and protonix (may use marinol as needed)  Office visit in 6 months  Loose 10 pounds between now and end of year  Repeat colonoscopy in 5 years

## 2011-10-10 NOTE — Progress Notes (Signed)
Primary Care Physician:  Fredirick Maudlin, MD Primary Gastroenterologist:  Dr. Jena Gauss  Pre-Procedure History & Physical: HPI:  Tracey Mccullough (formally Tracey Mccullough - got married)is a 66 y.o. female here for follow up, with bloating / nausea dysphagia/  history of gastroparesis, colonic polyps. Recent colonoscopy demonstrated 2 adenomas removed. She's due for surveillance in 5 years. Recent EGD with Maloney dilation for cervical web and Schatzki's ring. Dysphagia has resolved area and reflux symptoms well controlled on protonix. Occasionally constipated. Takes fiber sporadically and MiraLax. Finally saw gynecologist. Is getting everything done including Pap smear. Nausea overall much better on Marinol;  she is trying to tear to gastroparesis diet.  Past Medical History  Diagnosis Date  . Hypertension   . Arthritis   . COPD (chronic obstructive pulmonary disease)   . Hypercholesterolemia   . Restless leg syndrome   . Stroke   . Acid reflux   . Irritable bowel syndrome (IBS)   . Gastroparesis     INTOLERANT TO REGLAN; On Domperidone  . Depression   . GERD (gastroesophageal reflux disease)   . Schatzki's ring     EGD 1/12  . Hypokalemia 11/01/10    requiring admission  . Thyroid disease   . Hemorrhoids 09/01/2011    external    Past Surgical History  Procedure Date  . Cholecystectomy 1978  . Tubal ligation 1972  . Carpal tunnel release     bilateral  . Fracture surgery 2011    right arm  . Esophagogastroduodenoscopy 04/2010    Schatzki ring s/p dilation, minimal retained food. Versed 7/Demeral 150/Phenergan12.5.  . Kidney stone surgery 2008  . Colonoscopy 09/2006    hemorrhoids  . Colonoscopy 09/01/2011    Dr. Lovie Macadamia hemorrhoids, tubular adenoma  . Esophagogastroduodenoscopy 09/01/2011    Dr. Faith Rogue esophageal web and schatzki's ring, small hiatal hernia, antral erosions-bx-mild chronic inflammation.    Prior to Admission medications   Medication Sig Start  Date End Date Taking? Authorizing Provider  amLODipine-benazepril (LOTREL) 10-20 MG per capsule Take 1 capsule by mouth every morning.    Yes Historical Provider, MD  dipyridamole-aspirin (AGGRENOX) 25-200 MG per 12 hr capsule Take 1 capsule by mouth 2 (two) times daily.    Yes Historical Provider, MD  dronabinol (MARINOL) 2.5 MG capsule Take 2.5 mg by mouth 2 (two) times daily before a meal.   Yes Historical Provider, MD  HYDROcodone-acetaminophen (LORTAB) 10-500 MG per tablet Take 1 tablet by mouth 3 (three) times daily as needed. For pain   Yes Historical Provider, MD  levothyroxine (SYNTHROID, LEVOTHROID) 50 MCG tablet Take 50 mcg by mouth every morning.  07/28/11  Yes Historical Provider, MD  pantoprazole (PROTONIX) 40 MG tablet Take 1 tablet (40 mg total) by mouth 2 (two) times daily. 09/01/11  Yes Corbin Ade, MD  Probiotic Product (PROBIOTIC FORMULA PO) Take 1 capsule by mouth daily.    Yes Historical Provider, MD  rOPINIRole (REQUIP) 1 MG tablet Take 1 mg by mouth at bedtime.     Yes Historical Provider, MD  tiotropium (SPIRIVA) 18 MCG inhalation capsule Place 18 mcg into inhaler and inhale every evening.    Yes Historical Provider, MD  predniSONE (DELTASONE) 10 MG tablet Take 4 tablets (40 mg total) by mouth daily. 09/26/11   Doug Sou, MD    Allergies as of 10/10/2011 - Review Complete 10/10/2011  Allergen Reaction Noted  . Metoclopramide hcl Other (See Comments)   . Penicillins Anaphylaxis   . Zofran Other (See Comments) 11/26/2010  Family History  Problem Relation Age of Onset  . Cancer Father     ?leukemia  . Cancer Sister   . Colon cancer Brother 25  . Colon cancer Sister 16    recently diagnosed    History   Social History  . Marital Status: Married    Spouse Name: N/A    Number of Children: 3  . Years of Education: N/A   Occupational History  .     Social History Main Topics  . Smoking status: Current Everyday Smoker -- 2.0 packs/day    Types:  Cigarettes  . Smokeless tobacco: Not on file  . Alcohol Use: No  . Drug Use: No  . Sexually Active: Not Currently   Other Topics Concern  . Not on file   Social History Narrative  . No narrative on file    Review of Systems: See HPI, otherwise negative ROS  Physical Exam: BP 148/81  Pulse 86  Temp 97.4 F (36.3 C) (Temporal)  Ht 5\' 4"  (1.626 m)  Wt 182 lb (82.555 kg)  BMI 31.24 kg/m2 General:   Alert,  Well-developed, well-nourished, pleasant and cooperative in NAD Skin:  Intact without significant lesions or rashes. Eyes:  Sclera clear, no icterus.   Conjunctiva pink. Ears:  Normal auditory acuity. Nose:  No deformity, discharge,  or lesions. Mouth:  No deformity or lesions. Neck:  Supple; no masses or thyromegaly. No significant cervical adenopathy. Lungs:  Clear throughout to auscultation.   No wheezes, crackles, or rhonchi. No acute distress. Heart:  Regular rate and rhythm; no murmurs, clicks, rubs,  or gallops. Abdomen: Non-distended, No succussion splash .normal bowel sounds.  Soft and nontender without appreciable mass or hepatosplenomegaly.  Pulses:  Normal pulses noted. Extremities:  Without clubbing or edema.  Impression/Plan:  Overall patient is doing better gastroparesis symptoms improved reflux symptoms well controlled. Dysphagia resolved status post esophageal dilation. Occasional constipation and bloating continued the issues. She remains a good 30-40 pounds over her ideal body weight.Benefiber 2 tablespoons daily   Miralax  1 capful at bedtime as needed for constipation Gastroparesis diet Digestive advantage for gas/bloat Continue marinol and protonix (may use marinol as needed) Office visit in 6 months Loose 10 pounds between now and end of year Repeat colonoscopy in 5 years

## 2011-10-20 ENCOUNTER — Other Ambulatory Visit: Payer: Self-pay

## 2011-10-20 MED ORDER — DRONABINOL 2.5 MG PO CAPS: 2.5000 mg | ORAL_CAPSULE | Freq: Two times a day (BID) | ORAL | Status: DC

## 2011-11-27 ENCOUNTER — Other Ambulatory Visit: Payer: Self-pay | Admitting: Gastroenterology

## 2012-01-19 ENCOUNTER — Encounter: Payer: Self-pay | Admitting: Internal Medicine

## 2012-01-19 ENCOUNTER — Telehealth: Payer: Self-pay

## 2012-01-19 MED ORDER — DEXLANSOPRAZOLE 60 MG PO CPDR: 60.0000 mg | DELAYED_RELEASE_CAPSULE | Freq: Every day | ORAL | Status: DC

## 2012-01-19 NOTE — Telephone Encounter (Signed)
Pt is aware and rx has been sent. Informed pt that if insurance wouldn't pay for it she needs to call and I would see if we have any samples for her to try and see if it helps prior to doing a PA.

## 2012-01-19 NOTE — Telephone Encounter (Signed)
Agree 

## 2012-01-19 NOTE — Telephone Encounter (Signed)
Pt called- left voicemail- she is taking protonix and has started having some side effects from it. She is jittery, has a headache and has cramps in her hands and feet. She is unable to take nexium either.   Pt wants to know if she can try something else. [facility] uses [facility] [place].

## 2012-01-19 NOTE — Telephone Encounter (Signed)
She' been taking protonix since at least June. I think it would be unusual for her symptoms to be related to protonix. However, we can stop it. Try Dexilant 60 mg daily - dispense 31; take 1 each morning before breakfast daily

## 2012-01-19 NOTE — Telephone Encounter (Signed)
See if we need to do a PA. In interim, provide samples.

## 2012-01-19 NOTE — Telephone Encounter (Signed)
Pt is going to come in the morning to pick up the samples and the assistance paper work for [employer].  I told her she could try to use Zantac or Prilosec for the night until she can get here in the morning.

## 2012-01-19 NOTE — Telephone Encounter (Signed)
Pt called back and stated that she can not afford the medication. Is there something different she can try. She needs something because she can not take the burning. Please advise

## 2012-01-29 ENCOUNTER — Other Ambulatory Visit: Payer: Self-pay | Admitting: Internal Medicine

## 2012-01-30 ENCOUNTER — Other Ambulatory Visit: Payer: Self-pay | Admitting: Urgent Care

## 2012-01-30 MED ORDER — DRONABINOL 2.5 MG PO CAPS: 2.5000 mg | ORAL_CAPSULE | Freq: Two times a day (BID) | ORAL | Status: DC

## 2012-02-02 ENCOUNTER — Other Ambulatory Visit: Payer: Self-pay | Admitting: Internal Medicine

## 2012-06-09 ENCOUNTER — Other Ambulatory Visit: Payer: Self-pay

## 2012-06-10 ENCOUNTER — Telehealth: Payer: Self-pay

## 2012-06-10 ENCOUNTER — Other Ambulatory Visit (HOSPITAL_COMMUNITY): Payer: Self-pay | Admitting: Pulmonary Disease

## 2012-06-10 ENCOUNTER — Ambulatory Visit (HOSPITAL_COMMUNITY)
Admission: RE | Admit: 2012-06-10 | Discharge: 2012-06-10 | Disposition: A | Discharge status: Home / Self Care | Payer: BC Managed Care – PPO | Source: Ambulatory Visit | Attending: Pulmonary Disease | Admitting: Pulmonary Disease

## 2012-06-10 DIAGNOSIS — J4489 Other specified chronic obstructive pulmonary disease: Secondary | ICD-10-CM | POA: Insufficient documentation

## 2012-06-10 DIAGNOSIS — R0602 Shortness of breath: Secondary | ICD-10-CM | POA: Insufficient documentation

## 2012-06-10 DIAGNOSIS — J449 Chronic obstructive pulmonary disease, unspecified: Secondary | ICD-10-CM

## 2012-06-10 MED ORDER — DRONABINOL 2.5 MG PO CAPS: 2.5000 mg | ORAL_CAPSULE | Freq: Two times a day (BID) | ORAL | Status: DC

## 2012-06-10 NOTE — Telephone Encounter (Signed)
Refill request on the Marinol 2.5 mg faxed to [facility] [place].

## 2012-06-14 ENCOUNTER — Other Ambulatory Visit: Payer: Self-pay

## 2012-06-14 MED ORDER — HYOSCYAMINE SULFATE 0.125 MG SL SUBL: 0.1250 mg | SUBLINGUAL_TABLET | SUBLINGUAL | Status: DC | PRN

## 2012-06-15 ENCOUNTER — Encounter: Payer: Self-pay | Admitting: Internal Medicine

## 2012-06-15 ENCOUNTER — Ambulatory Visit (INDEPENDENT_AMBULATORY_CARE_PROVIDER_SITE_OTHER): Payer: BC Managed Care – PPO | Admitting: Internal Medicine

## 2012-06-15 VITALS — BP 127/76 | HR 108 | Temp 97.4°F | Ht 64.0 in | Wt 188.4 lb

## 2012-06-15 DIAGNOSIS — K219 Gastro-esophageal reflux disease without esophagitis: Secondary | ICD-10-CM

## 2012-06-15 DIAGNOSIS — K3184 Gastroparesis: Secondary | ICD-10-CM

## 2012-06-15 NOTE — Progress Notes (Signed)
Primary Care Physician:  Tracey Maudlin, MD Primary Gastroenterologist:  Dr. Jena Mccullough  Pre-Procedure History & Physical: HPI:  Tracey Mccullough is a 67 y.o. female here for followup of gastroparesis. Not on domperidone or Reglan. Has taken Marinol 2.5 mg daily only sporadically. Worsening of her diarrhea since starting Prilosec OTC 2 months ago. White Dexilant but could not afford. Dysphagia resolved since she had her web and ring dilated last year. She'll be due for surveillance colonoscopy for adenomatous polyps in 4 years from now. She's gained 6-1/2 pounds since she was last seen in our office. She tells me Dr. Juanetta Mccullough has reported to her that her thyroid is borderline low. The blood work for thyroid is going to be repeated later this week.  Strange side effects with Protonix - held.  Past Medical History  Diagnosis Date  . Hypertension   . Arthritis   . COPD (chronic obstructive pulmonary disease)   . Hypercholesterolemia   . Restless leg syndrome   . Stroke   . Acid reflux   . Irritable bowel syndrome (IBS)   . Gastroparesis     INTOLERANT TO REGLAN; On marinol  . Depression   . GERD (gastroesophageal reflux disease)   . Schatzki's ring     EGD 1/12  . Hypokalemia 11/01/10    requiring admission  . Thyroid disease   . Hemorrhoids 09/01/2011    external    Past Surgical History  Procedure Laterality Date  . Cholecystectomy  1978  . Tubal ligation  1972  . Carpal tunnel release      bilateral  . Fracture surgery  2011    right arm  . Esophagogastroduodenoscopy  04/2010    Schatzki ring s/p dilation, minimal retained food. Versed 7/Demeral 150/Phenergan12.5.  . Kidney stone surgery  2008  . Colonoscopy  09/2006    hemorrhoids  . Colonoscopy  09/01/2011    Dr. Lovie Mccullough hemorrhoids, tubular adenoma  . Esophagogastroduodenoscopy  09/01/2011    Dr. Faith Mccullough esophageal web and schatzki's ring, small hiatal hernia, antral erosions-bx-mild chronic inflammation.     Prior to Admission medications   Medication Sig Start Date End Date Taking? Authorizing Provider  amLODipine-benazepril (LOTREL) 10-20 MG per capsule Take 1 capsule by mouth every morning.    Yes Historical Provider, MD  dipyridamole-aspirin (AGGRENOX) 25-200 MG per 12 hr capsule Take 1 capsule by mouth 2 (two) times daily.    Yes Historical Provider, MD  dronabinol (MARINOL) 2.5 MG capsule Take 1 capsule (2.5 mg total) by mouth 2 (two) times daily before a meal. 06/09/12  Yes Joselyn Arrow, NP  HYDROcodone-acetaminophen (NORCO) 10-325 MG per tablet 1 tablet every 6 (six) hours as needed.  06/04/12  Yes Historical Provider, MD  hyoscyamine (LEVSIN SL) 0.125 MG SL tablet Place 1 tablet (0.125 mg total) under the tongue every 4 (four) hours as needed for cramping. 06/14/12  Yes Nira Retort, NP  levothyroxine (SYNTHROID, LEVOTHROID) 50 MCG tablet Take 50 mcg by mouth every morning.  07/28/11  Yes Historical Provider, MD  omeprazole (PRILOSEC) 20 MG capsule Take 20 mg by mouth daily.   Yes Historical Provider, MD  Probiotic Product (PROBIOTIC FORMULA PO) Take 1 capsule by mouth daily.    Yes Historical Provider, MD  rOPINIRole (REQUIP) 1 MG tablet Take 1 mg by mouth at bedtime.     Yes Historical Provider, MD  tiotropium (SPIRIVA) 18 MCG inhalation capsule Place 18 mcg into inhaler and inhale every evening.    Yes Historical Provider,  MD  dexlansoprazole (DEXILANT) 60 MG capsule Take 1 capsule (60 mg total) by mouth daily. 01/19/12   Corbin Ade, MD  pantoprazole (PROTONIX) 40 MG tablet Take 1 tablet (40 mg total) by mouth 2 (two) times daily. 09/01/11   Corbin Ade, MD  pantoprazole (PROTONIX) 40 MG tablet TAKE 1 TABLET BY MOUTH TWICE A DAY 11/27/11   Joselyn Arrow, NP  predniSONE (DELTASONE) 10 MG tablet Take 4 tablets (40 mg total) by mouth daily. 09/26/11   Doug Sou, MD    Allergies as of 06/15/2012 - Review Complete 06/15/2012  Allergen Reaction Noted  . Metoclopramide hcl Other (See  Comments)   . Penicillins Anaphylaxis   . Zofran Other (See Comments) 11/26/2010    Family History  Problem Relation Age of Onset  . Cancer Father     ?leukemia  . Cancer Sister   . Colon cancer Brother 65  . Colon cancer Sister 41    recently diagnosed    History   Social History  . Marital Status: Married    Spouse Name: N/A    Number of Children: 3  . Years of Education: N/A   Occupational History  .     Social History Main Topics  . Smoking status: Current Every Day Smoker -- 2.00 packs/day    Types: Cigarettes  . Smokeless tobacco: Not on file  . Alcohol Use: No  . Drug Use: No  . Sexually Active: Not Currently   Other Topics Concern  . Not on file   Social History Narrative  . No narrative on file    Review of Systems: See HPI, otherwise negative ROS  Physical Exam: BP 127/76  Pulse 108  Temp(Src) 97.4 F (36.3 C) (Oral)  Ht 5\' 4"  (1.626 m)  Wt 188 lb 6.4 oz (85.458 kg)  BMI 32.32 kg/m2 General:   Alert,  Well-developed, well-nourished, pleasant and cooperative in NAD Skin:  Intact without significant lesions or rashes. Eyes:  Sclera clear, no icterus.   Conjunctiva pink. Ears:  Normal auditory acuity. Nose:  No deformity, discharge,  or lesions. Mouth:  No deformity or lesions. Neck:  Supple; no masses or thyromegaly. No significant cervical adenopathy. Lungs:  Clear throughout to auscultation.   No wheezes, crackles, or rhonchi. No acute distress. Heart:  Regular rate and rhythm; no murmurs, clicks, rubs,  or gallops. Abdomen:  normal bowel sounds.  Soft and nontender without appreciable mass or hepatosplenomegaly.  Pulses:  Normal pulses noted. Extremities:  Without clubbing or edema.  Impression/Plan:  Pleasant 67 year old lady with gastroparesis and GERD. Has not really lost any weight. In fact, is gained 6 pounds since her last visit. Worsening of some diarrhea since starting Prilosec. Not taking Marinol on a regular basis. She gives her  report of borderline hypothyroidism.  Recommendations:    Take Marinol 2.5 mg orally twice daily for nausea. Stop Prilosec. Begin generic lansoprazole 30 mg orally daily. Prescription provided. Continue gastroparesis diet. Try and lose 10 pounds between now next office visit. Office visit in 6 months. Stress colonoscopy in 4 years.  Get thyroid rechecked as previously planned (elsewhere).

## 2012-06-15 NOTE — Patient Instructions (Signed)
Take Marinol 2.5 mg twice daily everyday  Stop Prilosec; Begin Prevacid 30 mg daily  Continue gastroparesis diet  Surveillance colonoscopy in 4 years  Office visit 6 months

## 2012-06-18 ENCOUNTER — Other Ambulatory Visit (HOSPITAL_COMMUNITY): Payer: Self-pay | Admitting: Pulmonary Disease

## 2012-06-18 DIAGNOSIS — R7989 Other specified abnormal findings of blood chemistry: Secondary | ICD-10-CM

## 2012-06-22 ENCOUNTER — Ambulatory Visit (HOSPITAL_COMMUNITY): Payer: BC Managed Care – PPO

## 2012-06-24 ENCOUNTER — Ambulatory Visit (HOSPITAL_COMMUNITY)
Admission: RE | Admit: 2012-06-24 | Discharge: 2012-06-24 | Disposition: A | Discharge status: Home / Self Care | Payer: BC Managed Care – PPO | Source: Ambulatory Visit | Attending: Pulmonary Disease | Admitting: Pulmonary Disease

## 2012-06-24 DIAGNOSIS — R7989 Other specified abnormal findings of blood chemistry: Secondary | ICD-10-CM

## 2012-06-24 DIAGNOSIS — Z9889 Other specified postprocedural states: Secondary | ICD-10-CM | POA: Insufficient documentation

## 2012-07-12 ENCOUNTER — Telehealth: Payer: Self-pay | Admitting: *Deleted

## 2012-07-12 NOTE — Telephone Encounter (Signed)
It would be best to bring her in to see extender face-to-face to re-assess

## 2012-07-12 NOTE — Telephone Encounter (Signed)
Ms Tracey Mccullough called today. She was seen about a month ago, she has been having some nausea. Now she says she still is having the nausea but is also starting to feel weak. She would like a call back. Thank you.

## 2012-07-12 NOTE — Telephone Encounter (Signed)
Spoke with pt- her nausea is not any better and she feels weak all the time. She is taking the marinol bid as prescribed and the prevacid, she is following her diet and not taking any phenergan.  She recently had an ultrasound and blood work done at Dr. Juanetta Gosling. The [place] is in epic and Dr. Juanetta Gosling told her that she had a fatty liver. The patient will call Dr. Juanetta Gosling and ask them to fax [place] a copy of her blood work. She stated they told her that her liver enzymes were elevated but she didn't know anything about her thyroid.  Pt wants to know if there are any more tests or anything else we can do. She is tired of being sick all the time. Please advise.

## 2012-07-12 NOTE — Telephone Encounter (Signed)
Susan, please schedule pt appt. Thanks. 

## 2012-07-14 ENCOUNTER — Encounter: Payer: Self-pay | Admitting: Internal Medicine

## 2012-07-14 NOTE — Telephone Encounter (Signed)
Pt is aware of OV on 08/03/2012 at 1130 with [facility] and appt card was mailed

## 2012-08-03 ENCOUNTER — Ambulatory Visit: Payer: BC Managed Care – PPO | Admitting: Gastroenterology

## 2012-08-12 ENCOUNTER — Ambulatory Visit: Payer: BC Managed Care – PPO | Admitting: Gastroenterology

## 2012-08-12 ENCOUNTER — Telehealth: Payer: Self-pay | Admitting: Gastroenterology

## 2012-08-12 NOTE — Telephone Encounter (Signed)
Pt was a no show

## 2012-08-12 NOTE — Telephone Encounter (Signed)
Patient called to [facility] and is aware of new OV

## 2012-08-24 ENCOUNTER — Encounter: Payer: Self-pay | Admitting: Gastroenterology

## 2012-08-24 ENCOUNTER — Ambulatory Visit (INDEPENDENT_AMBULATORY_CARE_PROVIDER_SITE_OTHER): Payer: BC Managed Care – PPO | Admitting: Gastroenterology

## 2012-08-24 VITALS — BP 112/72 | HR 98 | Temp 98.3°F | Ht 65.0 in | Wt 189.0 lb

## 2012-08-24 DIAGNOSIS — R945 Abnormal results of liver function studies: Secondary | ICD-10-CM

## 2012-08-24 DIAGNOSIS — R5381 Other malaise: Secondary | ICD-10-CM

## 2012-08-24 DIAGNOSIS — D72829 Elevated white blood cell count, unspecified: Secondary | ICD-10-CM

## 2012-08-24 DIAGNOSIS — K3184 Gastroparesis: Secondary | ICD-10-CM

## 2012-08-24 DIAGNOSIS — K7689 Other specified diseases of liver: Secondary | ICD-10-CM

## 2012-08-24 DIAGNOSIS — R5383 Other fatigue: Secondary | ICD-10-CM

## 2012-08-24 DIAGNOSIS — K219 Gastro-esophageal reflux disease without esophagitis: Secondary | ICD-10-CM

## 2012-08-24 DIAGNOSIS — R7989 Other specified abnormal findings of blood chemistry: Secondary | ICD-10-CM

## 2012-08-24 DIAGNOSIS — K760 Fatty (change of) liver, not elsewhere classified: Secondary | ICD-10-CM

## 2012-08-24 LAB — CBC WITH DIFFERENTIAL/PLATELET
Basophils Absolute: 0 10*3/uL (ref 0.0–0.1)
HCT: 41 % (ref 36.0–46.0)
Lymphocytes Relative: 30 % (ref 12–46)
Monocytes Absolute: 0.7 10*3/uL (ref 0.1–1.0)
Neutro Abs: 5.2 10*3/uL (ref 1.7–7.7)
RBC: 4.65 MIL/uL (ref 3.87–5.11)
RDW: 14.7 % (ref 11.5–15.5)
WBC: 8.9 10*3/uL (ref 4.0–10.5)

## 2012-08-24 LAB — IGG, IGA, IGM
IgG (Immunoglobin G), Serum: 612 mg/dL — ABNORMAL LOW (ref 690–1700)
IgM, Serum: 57 mg/dL (ref 52–322)

## 2012-08-24 LAB — IRON AND TIBC: UIBC: 261 ug/dL (ref 125–400)

## 2012-08-24 LAB — HEPATIC FUNCTION PANEL
ALT: 27 U/L (ref 0–35)
Bilirubin, Direct: 0.1 mg/dL (ref 0.0–0.3)
Indirect Bilirubin: 0.2 mg/dL (ref 0.0–0.9)

## 2012-08-24 LAB — FERRITIN: Ferritin: 106 ng/mL (ref 10–291)

## 2012-08-24 LAB — CERULOPLASMIN: Ceruloplasmin: 27 mg/dL (ref 20–60)

## 2012-08-24 NOTE — Assessment & Plan Note (Addendum)
Chronic gastroparesis. Intolerant to Reglan, EES, Zofran. Previously did well on domperidone but no longer available. Avoiding Phenergan due to fatigue. Has been on Marinol off and on for about a year. Using consistently for the last 3 to 4 months. Helps but develops recurrent symptoms before next dose is due. Also complains of one year history of significant fatigue which is limiting her daily activities. No improvement with correction of her hypothyroidism on supplement. Denies progressive COPD symptoms. No evidence of anemia. Question side effect of Marinol.   For now, strict gastroparesis diet. Encouraged her to consume 5-6 small meals/snacks daily. Further recommendations to follow.

## 2012-08-24 NOTE — Progress Notes (Signed)
Cc PCP 

## 2012-08-24 NOTE — Patient Instructions (Signed)
1. Please have your bloodwork done. 2. Please try to eat a small meals every 4 hours to assist with her gastroparesis and metabolism.  Instructions for fatty liver: Recommend 1-2# weight loss per week until ideal body weight through exercise & diet. Low fat/cholesterol diet.   Avoid sweets, sodas, fruit juices, sweetened beverages like tea, etc. Gradually increase exercise from 15 min daily up to 1 hr per day 5 days/week. Limit alcohol use.  Fatty Liver Fatty liver is the accumulation of fat in liver cells. It is also called hepatosteatosis or steatohepatitis. It is normal for your liver to contain some fat. If fat is more than 5 to 10% of your liver's weight, you have fatty liver.  There are often no symptoms (problems) for years while damage is still occurring. People often learn about their fatty liver when they have medical tests for other reasons. Fat can damage your liver for years or even decades without causing problems. When it becomes severe, it can cause fatigue, weight loss, weakness, and confusion. This makes you more likely to develop more serious liver problems. The liver is the largest organ in the body. It does a lot of work and often gives no warning signs when it is sick until late in a disease. The liver has many important jobs including:  Breaking down foods.  Storing vitamins, iron, and other minerals.  Making proteins.  Making bile for food digestion.  Breaking down many products including medications, alcohol and some poisons. CAUSES  There are a number of different conditions, medications, and poisons that can cause a fatty liver. Eating too many calories causes fat to build up in the liver. Not processing and breaking fats down normally may also cause this. Certain conditions, such as obesity, diabetes, and high triglycerides also cause this. Most fatty liver patients tend to be middle-aged and over weight.  Some causes of fatty liver are:  Alcohol over  consumption.  Malnutrition.  Steroid use.  Valproic acid toxicity.  Obesity.  Cushing's syndrome.  Poisons.  Tetracycline in high dosages.  Pregnancy.  Diabetes.  Hyperlipidemia.  Rapid weight loss. Some people develop fatty liver even having none of these conditions. SYMPTOMS  Fatty liver most often causes no problems. This is called asymptomatic.  It can be diagnosed with blood tests and also by a liver biopsy.  It is one of the most common causes of minor elevations of liver enzymes on routine blood tests.  Specialized Imaging of the liver using ultrasound, CT (computed tomography) scan, or MRI (magnetic resonance imaging) can suggest a fatty liver but a biopsy is needed to confirm it.  A biopsy involves taking a small sample of liver tissue. This is done by using a needle. It is then looked at under a microscope by a specialist. TREATMENT  It is important to treat the cause. Simple fatty liver without a medical reason may not need treatment.  Weight loss, fat restriction, and exercise in overweight patients produces inconsistent results but is worth trying.  Fatty liver due to alcohol toxicity may not improve even with stopping drinking.  Good control of diabetes may reduce fatty liver.  Lower your triglycerides through diet, medication or both.  Eat a balanced, healthy diet.  Increase your physical activity.  Get regular checkups from a liver specialist.  There are no medical or surgical treatments for a fatty liver or NASH, but improving your diet and increasing your exercise may help prevent or reverse some of the damage. PROGNOSIS  Fatty liver may cause no damage or it can lead to an inflammation of the liver. This is, called steatohepatitis. When it is linked to alcohol abuse, it is called alcoholic steatohepatitis. It often is not linked to alcohol. It is then called nonalcoholic steatohepatitis, or NASH. Over time the liver may become scarred and  hardened. This condition is called cirrhosis. Cirrhosis is serious and may lead to liver failure or cancer. NASH is one of the leading causes of cirrhosis. About 10-20% of Americans have fatty liver and a smaller 2-5% has NASH. Document Released: 05/23/2005 Document Revised: 06/30/2011 Document Reviewed: 07/16/2005 [facility] Patient Information 2013 [place], [place].  Gastroparesis  Gastroparesis is also called slowed stomach emptying (delayed gastric emptying). It is a condition in which the stomach takes too long to empty its contents. It often happens in people with diabetes.  CAUSES  Gastroparesis happens when nerves to the stomach are damaged or stop working. When the nerves are damaged, the muscles of the stomach and intestines do not work normally. The movement of food is slowed or stopped. High blood glucose (sugar) causes changes in nerves and can damage the blood vessels that carry oxygen and nutrients to the nerves. RISK FACTORS  Diabetes.  Post-viral syndromes.  Eating disorders (anorexia, bulimia).  Surgery on the stomach or vagus nerve.  Gastroesophageal reflux disease (rarely).  Smooth muscle disorders (amyloidosis, scleroderma).  Metabolic disorders, including hypothyroidism.  Parkinson's disease. SYMPTOMS   Heartburn.  Feeling sick to your stomach (nausea).  Vomiting of undigested food.  An early feeling of fullness when eating.  Weight loss.  Abdominal bloating.  Erratic blood glucose levels.  Lack of appetite.  Gastroesophageal reflux.  Spasms of the stomach wall. Complications can include:  Bacterial overgrowth in stomach. Food stays in the stomach and can ferment and cause bacteria to grow.  Weight loss due to difficulty digesting and absorbing nutrients.  Vomiting.  Obstruction in the stomach. Undigested food can harden and cause nausea and vomiting.  Blood glucose fluctuations caused by inconsistent food absorption. DIAGNOSIS  The  diagnosis of gastroparesis is confirmed through one or more of the following tests:  Barium X-rays and scans. These tests look at how long it takes for food to move through the stomach.  Gastric manometry. This test measures electrical and muscular activity in the stomach. A thin tube is passed down the throat into the stomach. The tube contains a wire that takes measurements of the stomach's electrical and muscular activity as it digests liquids and solid food.  Endoscopy. This procedure is done with a long, thin tube called an endoscope. It is passed through the mouth and gently guides down the esophagus into the stomach. This tube helps the caregiver look at the lining of the stomach to check for any abnormalities.  Ultrasound. This can rule out gallbladder disease or pancreatitis. This test will outline and define the shape of the gallbladder and pancreas. TREATMENT   The primary treatment is to identify the problem and help control blood glucose levels. Treatments include:  Exercise.  Medicines to control nausea and vomiting.  Medicines to stimulate stomach muscles.  Changes in what and when you eat.  Having smaller meals more often.  Eating low-fiber forms of high-fiber foods, such aseating cooked vegetables instead of raw vegetables.  Eating low-fat foods.  Consuming liquids, which are easier to digest.  In severe cases, feeding tubes and intravenous (IV) feeding may be needed. It is important to note that in most cases, treatment does  not cure gastroparesis. It is usually a lasting (chronic) condition. Treatment helps you manage the condition so that you can be as healthy and comfortable as possible. NEW TREATMENTS  A gastric neurostimulator has been developed to assist people with gastroparesis. The battery-operated device is surgically implanted. It emits mild electrical pulses to help improve stomach emptying and to control nausea and vomiting.  The use of botulinum  toxin has been shown to improve stomach emptying by decreasing the prolonged contractions of the muscle between the stomach and the small intestine (pyloric sphincter). The benefits are temporary. SEEK MEDICAL CARE IF:   You are having problems keeping your blood glucose in goal range.  You are having nausea, vomiting, bloating, or early feelings of fullness with eating.  Your symptoms do not change with a change in diet. Document Released: 04/07/2005 Document Revised: 06/30/2011 Document Reviewed: 09/14/2008 [facility] Patient Information 2013 [place], [place].

## 2012-08-24 NOTE — Assessment & Plan Note (Signed)
Likely secondary to fatty liver. Needs to have viral markers checked along with iron/ferritin, autoimmune workup. Information provided regarding fatty liver. Encouraged her to stop towards a 10 pound weight loss in the next 2-3 months.

## 2012-08-24 NOTE — Assessment & Plan Note (Signed)
Doing better on lansoprazole.

## 2012-08-24 NOTE — Assessment & Plan Note (Signed)
Repeat CBC today 

## 2012-08-24 NOTE — Progress Notes (Signed)
Primary Care Physician: Fredirick Maudlin, MD  Primary Gastroenterologist:  Roetta Sessions, MD   Chief Complaint  Patient presents with  . Follow-up    HPI: Tracey Mccullough is a 67 y.o. female here for followup of gastroparesis, fatty liver and abnormal LFTs. Last seen in February 2014 by Dr. Jena Gauss. Previously did not tolerate Reglan due to involuntary facial motor movement. Intolerant of EES. Domperidone not available, previously controlled on this medication. Evaluated at [facility] remotely for her gastroparesis.Marland Kitchen Has been on Marinol 2.5 mg since April of last year. Although was only taken sporadically in the past she has been taking twice a day since February.  Significant fatigue/weakness now for a year. Now on synthroid. No difference in symptoms since on the medication. Hard to get up and go. No weight loss. Carnation instant breakfast mostly one a day or two a day. No significant DOE. COPD stable. Walks slow due to hips/COPD. Fatigue causes anxiety. More bad days then good. Stopped phenergan to see if this was contributing to her fatigue but no significant improvement. Nausea not controlled, Marinol helps but symptoms return before next dose due. No vomiting. Heartburn better on prevacid but occasional bad days. Lower abdominal pain/cramps off/on responds to Levsin prn. Takes miralax to keep BMs regular. Drinking fluids throughout the day. Skim milk, soft drink, juice. Mouth stays dry but urine clear in color. Moving around makes nausea worse which she believes limits her activity level.  Recent abnormal LFTs. Abdominal ultrasound suggestive of fatty liver. No other workup has been done to this point.   Current Outpatient Prescriptions  Medication Sig Dispense Refill  . amLODipine-benazepril (LOTREL) 10-20 MG per capsule Take 1 capsule by mouth every morning.       . dipyridamole-aspirin (AGGRENOX) 25-200 MG per 12 hr capsule Take 1 capsule by mouth 2 (two) times daily.       Marland Kitchen dronabinol  (MARINOL) 2.5 MG capsule Take 1 capsule (2.5 mg total) by mouth 2 (two) times daily before a meal.  60 capsule  2  . hyoscyamine (LEVSIN SL) 0.125 MG SL tablet Place 1 tablet (0.125 mg total) under the tongue every 4 (four) hours as needed for cramping.  30 tablet  3  . lansoprazole (PREVACID) 30 MG capsule Take 30 mg by mouth daily.       Marland Kitchen levothyroxine (SYNTHROID, LEVOTHROID) 50 MCG tablet Take 50 mcg by mouth every morning.       . Probiotic Product (PROBIOTIC FORMULA PO) Take 1 capsule by mouth daily.       Marland Kitchen rOPINIRole (REQUIP) 1 MG tablet Take 1 mg by mouth at bedtime.        Marland Kitchen tiotropium (SPIRIVA) 18 MCG inhalation capsule Place 18 mcg into inhaler and inhale every evening.        No current facility-administered medications for this visit.    Allergies as of 08/24/2012 - Review Complete 08/24/2012  Allergen Reaction Noted  . Metoclopramide hcl Other (See Comments)   . Penicillins Anaphylaxis   . Zofran Other (See Comments) 11/26/2010   Past Surgical History  Procedure Laterality Date  . Cholecystectomy  1978  . Tubal ligation  1972  . Carpal tunnel release      bilateral  . Fracture surgery  2011    right arm  . Esophagogastroduodenoscopy  04/2010    Schatzki ring s/p dilation, minimal retained food. Versed 7/Demeral 150/Phenergan12.5.  . Kidney stone surgery  2008  . Colonoscopy  09/2006    hemorrhoids  .  Colonoscopy  09/01/2011    Dr. Lovie Macadamia hemorrhoids, tubular adenoma  . Esophagogastroduodenoscopy  09/01/2011    Dr. Faith Rogue esophageal web and schatzki's ring, small hiatal hernia, antral erosions-bx-mild chronic inflammation.    ROS:  General: Negative for anorexia, weight loss, fever, chills. Complains of fatigue, weakness. ENT: Negative for hoarseness, difficulty swallowing , nasal congestion. CV: Negative for chest pain, angina, palpitations, dyspnea on exertion, peripheral edema.  Respiratory: Negative for dyspnea at rest, dyspnea on exertion,  cough, sputum, wheezing.  GI: See history of present illness. GU:  Negative for dysuria, hematuria, urinary incontinence, urinary frequency, nocturnal urination.  Endo: Negative for unusual weight change.    Physical Examination:   BP 112/72  Pulse 98  Temp(Src) 98.3 F (36.8 C) (Oral)  Ht 5\' 5"  (1.651 m)  Wt 189 lb (85.73 kg)  BMI 31.45 kg/m2  General: Well-nourished, well-developed in no acute distress.  Eyes: No icterus. Mouth: Oropharyngeal mucosa moist and pink , no lesions erythema or exudate. Lungs: Clear to auscultation bilaterally.  Heart: Regular rate and rhythm, no murmurs rubs or gallops.  Abdomen: Bowel sounds are normal, nontender, nondistended, no splenomegaly or masses, no abdominal bruits or hernia , no rebound or guarding.  Liver edge easily palpated 6 fingerbreadths below the right costal margin in the midclavicular line. Extremities: No lower extremity edema. No clubbing or deformities. Neuro: Alert and oriented x 4   Skin: Warm and dry, no jaundice.   Psych: Alert and cooperative, normal mood and affect.  Labs:  Lab Results  Component Value Date   ALT 49* 09/25/2011   AST 53* 09/25/2011   ALKPHOS 103 09/25/2011   BILITOT 0.2* 09/25/2011   Labs from 06/10/2012. White blood cell count 11,300, hemoglobin 15.3, hematocrit 42.7, MCV 87.1, platelets 294,000. Sodium 138, potassium 3.9, glucose 106, BUN 7, creatinine 0.7, total bilirubin 0.4, alkaline phosphatase 133, AST 75, ALT 67, albumin 4.9, TSH 0.681.  Imaging Studies: abd u/s 05/2012:  IMPRESSION: Post cholecystectomy.Increased echogenicity and  heterogeneity of the parenchymal echotexture of the liver without  mass or focal parenchymal abnormalities seen otherwise. Most  commonly this appearance is associated with fatty infiltration of  the liver. Decreased attenuation of the liver compared to the  spleen consistent with fatty infiltration of the liver was  demonstrated on prior CT as well.

## 2012-08-25 LAB — CBC
HCT: 43 %
Hemoglobin: 15.3 g/dL (ref 12.0–16.0)
WBC: 11.3

## 2012-08-25 LAB — COMPREHENSIVE METABOLIC PANEL
ALT: 67 U/L — AB (ref 7–35)
AST: 75 U/L
Alkaline Phosphatase: 133 U/L
Creat: 0.7
Sodium: 138 mmol/L (ref 137–147)
Total Bilirubin: 0.4 mg/dL

## 2012-08-25 LAB — MITOCHONDRIAL/SMOOTH MUSCLE AB PNL
Mitochondrial M2 Ab, IgG: 0.23
Smooth Muscle Ab: 8 U

## 2012-08-25 LAB — ANA: Anti Nuclear Antibody(ANA): NEGATIVE

## 2012-08-31 NOTE — Progress Notes (Signed)
Quick Note:  Please let patient know her LFTs are now normal. Negative viral markers and negative for autoimmune liver disorders, no hemochromatosis. So far only fatty liver, instructions given at time of OV.  Continue gastroparesis diet. 5-6 SMALL meals daily. I will see if RMR will agree to go up on Marinol dose. ______

## 2012-09-01 NOTE — Progress Notes (Signed)
Quick Note:  Tried to call pt- LMOM ______ 

## 2012-09-07 ENCOUNTER — Other Ambulatory Visit: Payer: Self-pay

## 2012-09-07 MED ORDER — DRONABINOL 2.5 MG PO CAPS: 2.5000 mg | ORAL_CAPSULE | Freq: Two times a day (BID) | ORAL | Status: DC

## 2012-09-08 NOTE — Progress Notes (Signed)
Quick Note:  Please let patient know I spoke with RMR and he said we can try to go up on Marinol dose over the next month to see if helps. She can take 5mg  BID. Call in RX for #30, no refills.  After one month, call with PR and we will decide long-term dose. OV in July with RMR only. ______

## 2012-09-08 NOTE — Progress Notes (Signed)
Quick Note:  rx called to [facility]. Darl Pikes, please schedule ov for July with RMR ______

## 2012-11-24 ENCOUNTER — Ambulatory Visit: Payer: BC Managed Care – PPO | Admitting: Internal Medicine

## 2012-11-30 ENCOUNTER — Ambulatory Visit: Payer: BC Managed Care – PPO | Admitting: Internal Medicine

## 2012-12-13 ENCOUNTER — Telehealth: Payer: Self-pay | Admitting: Internal Medicine

## 2012-12-13 MED ORDER — SUCRALFATE 1 GM/10ML PO SUSP: 1.0000 g | Freq: Four times a day (QID) | ORAL | Status: DC

## 2012-12-13 NOTE — Telephone Encounter (Signed)
Patient stating acid reflux is getting worse, burning from stomach to her throat, states shes in a lot of pain, medication doesn't seem to be working, please advise

## 2012-12-13 NOTE — Telephone Encounter (Signed)
Increase Prevacid to BID. Can add Carafate until OV. RX done. If chest pain/SOB/diaphoresis, go to ER to get checked out.

## 2012-12-13 NOTE — Telephone Encounter (Signed)
Spoke with pt- she said she could feel the burning start in her stomach and come up her esophagus. She has had a little bit of trouble with it the past two weeks but it got worse last night. Pt stated pain was excruciating, it was in her stomach, esophagus and chest.She takes her prevacid in the evening. She got up this morning and took an extra prevacid and has been watching her diet. She is aware that she has an appt with RMR on 12/24/12. Talked with pt about her symptoms, questioned her to make sure this wasn't a heart issue, advised her that if she had symptoms of a heart attack she should go to the ED to be evaluated. Went over some symptoms of a heart attack and pt denied them. Pt agreed to go to ED if symptoms worsen or she develops any new symptoms. Advised her to take her regular dose of prevacid this evening and I would talk with LSL tomorrow about her symptoms of reflux. Pt agreed with plan.

## 2012-12-14 NOTE — Telephone Encounter (Signed)
Called pt- LM with details

## 2012-12-24 ENCOUNTER — Ambulatory Visit: Payer: BC Managed Care – PPO | Admitting: Internal Medicine

## 2013-01-10 ENCOUNTER — Other Ambulatory Visit: Payer: Self-pay | Admitting: Internal Medicine

## 2013-01-25 ENCOUNTER — Ambulatory Visit: Payer: BC Managed Care – PPO | Admitting: Internal Medicine

## 2013-02-15 ENCOUNTER — Ambulatory Visit: Payer: BC Managed Care – PPO | Admitting: Internal Medicine

## 2013-02-24 ENCOUNTER — Other Ambulatory Visit: Payer: Self-pay

## 2013-03-15 ENCOUNTER — Ambulatory Visit: Payer: BC Managed Care – PPO | Admitting: Internal Medicine

## 2013-03-15 ENCOUNTER — Telehealth: Payer: Self-pay | Admitting: Internal Medicine

## 2013-03-15 NOTE — Telephone Encounter (Signed)
Pt was a no show

## 2013-04-25 ENCOUNTER — Other Ambulatory Visit: Payer: Self-pay | Admitting: Gastroenterology

## 2013-05-03 ENCOUNTER — Ambulatory Visit: Payer: BC Managed Care – PPO | Admitting: Internal Medicine

## 2013-05-03 ENCOUNTER — Telehealth: Payer: Self-pay | Admitting: Internal Medicine

## 2013-05-03 NOTE — Telephone Encounter (Signed)
Pt was a no show

## 2013-08-16 ENCOUNTER — Ambulatory Visit: Payer: BC Managed Care – PPO | Admitting: Internal Medicine

## 2013-08-16 ENCOUNTER — Telehealth: Payer: Self-pay | Admitting: Internal Medicine

## 2013-08-16 NOTE — Telephone Encounter (Signed)
Correction. This is the 4th no show

## 2013-08-16 NOTE — Telephone Encounter (Signed)
Patient was a no show. This makes the 3rd time

## 2013-08-17 ENCOUNTER — Encounter: Payer: Self-pay | Admitting: General Practice

## 2013-08-17 NOTE — Telephone Encounter (Signed)
Discharge letter mailed  

## 2013-08-17 NOTE — Telephone Encounter (Signed)
Divorced per office policy

## 2013-11-08 ENCOUNTER — Telehealth: Payer: Self-pay | Admitting: General Practice

## 2013-11-08 NOTE — Telephone Encounter (Signed)
I spoke with the patient and she wanted to know if we could take her back as a patient, because she has now worked out her transportation issues and she will not miss anymore appointments.    She would like to have one more chance.  She has seen RMR for over 20 years and really wants him to take care of her GI needs.  Routing to Dr. Gala Romney for approval.

## 2013-11-08 NOTE — Telephone Encounter (Signed)
Message copied by Idamae Schuller on Tue Nov 08, 2013  7:30 AM ------      Message from: Everardo All      Created: Mon Nov 07, 2013  3:36 PM       Pt called and wanted to know why she was being discharged from the practice. She said is it because I owe money, or missed an appt?      She is aware that you will return her call. [phone]) Thanks! ------

## 2013-11-09 NOTE — Telephone Encounter (Signed)
Pt is aware and stated this will not happen again

## 2013-11-09 NOTE — Telephone Encounter (Signed)
Per RMR the patient may come back to be seen at [facility], however if she No Shows or Miss one appt discharge letter will be reinstated.

## 2013-11-09 NOTE — Telephone Encounter (Signed)
Pt is scheduled to see RMR 8/26 at 11:30am.    Routing to RMR for review.

## 2013-11-09 NOTE — Telephone Encounter (Signed)
Correction 8/21 at 1130

## 2013-12-09 ENCOUNTER — Encounter: Payer: Self-pay | Admitting: Internal Medicine

## 2013-12-09 ENCOUNTER — Ambulatory Visit (INDEPENDENT_AMBULATORY_CARE_PROVIDER_SITE_OTHER): Payer: Managed Care, Other (non HMO) | Admitting: Internal Medicine

## 2013-12-09 ENCOUNTER — Other Ambulatory Visit: Payer: Self-pay | Admitting: Internal Medicine

## 2013-12-09 VITALS — BP 115/69 | HR 99 | Temp 97.6°F | Ht 63.0 in | Wt 191.4 lb

## 2013-12-09 DIAGNOSIS — R141 Gas pain: Secondary | ICD-10-CM

## 2013-12-09 DIAGNOSIS — R1013 Epigastric pain: Secondary | ICD-10-CM

## 2013-12-09 DIAGNOSIS — R143 Flatulence: Secondary | ICD-10-CM

## 2013-12-09 DIAGNOSIS — R109 Unspecified abdominal pain: Secondary | ICD-10-CM

## 2013-12-09 DIAGNOSIS — R142 Eructation: Secondary | ICD-10-CM

## 2013-12-09 LAB — CREATININE, SERUM: Creat: 0.78 mg/dL (ref 0.50–1.10)

## 2013-12-09 NOTE — Progress Notes (Signed)
Primary Care Physician:  Alonza Bogus, MD Primary Gastroenterologist:  Dr. Gala Romney  Pre-Procedure History & Physical: HPI:  Tracey Mccullough is a 68 y.o. female here for followup after multiple no shows. Significant history of noncompliance. Currently not on any medication for GERD. Changed insurance carriers. Has some left lower quadrant left flank pain from time to time worse when she lies on her left side. Not affected by eating or having a bowel movement. No urinary tract symptoms. Symptoms insidious over the past several months. Non-progressive. A workup here for mild elevation of LFTs negative as chronicled in the medical record. Subsequent followup LFTs normal. History of gastroparesis. Watches her diet only. Did not get anywhere with Marinol. Takes Phenergan when necessary. Mildly depressed iron saturation; low normal TIBC.  Not taking Carafate either. States she's been her bowels daily to every other day.  Past Medical History  Diagnosis Date  . Hypertension   . Arthritis   . COPD (chronic obstructive pulmonary disease)   . Hypercholesterolemia   . Restless leg syndrome   . Stroke   . Acid reflux   . Irritable bowel syndrome (IBS)   . Gastroparesis     INTOLERANT TO REGLAN; On marinol  . Depression   . GERD (gastroesophageal reflux disease)   . Schatzki's ring     EGD 1/12  . Hypokalemia 11/01/10    requiring admission  . Thyroid disease   . Hemorrhoids 09/01/2011    external    Past Surgical History  Procedure Laterality Date  . Cholecystectomy  1978  . Tubal ligation  1972  . Carpal tunnel release      bilateral  . Fracture surgery  2011    right arm  . Esophagogastroduodenoscopy  04/2010    Schatzki ring s/p dilation, minimal retained food. Versed 7/Demeral 150/Phenergan12.5.  . Kidney stone surgery  2008  . Colonoscopy  09/2006    hemorrhoids  . Colonoscopy  09/01/2011    Dr. Camillia Herter hemorrhoids, tubular adenoma  . Esophagogastroduodenoscopy   09/01/2011    Dr. Leodis Binet esophageal web and schatzki's ring, small hiatal hernia, antral erosions-bx-mild chronic inflammation.    Prior to Admission medications   Medication Sig Start Date End Date Taking? Authorizing Provider  amLODipine-benazepril (LOTREL) 10-20 MG per capsule Take 1 capsule by mouth every morning.    Yes Historical Provider, MD  dipyridamole-aspirin (AGGRENOX) 25-200 MG per 12 hr capsule Take 1 capsule by mouth 2 (two) times daily.    Yes Historical Provider, MD  furosemide (LASIX) 40 MG tablet Take 40 mg by mouth daily.  11/17/13  Yes Historical Provider, MD  HYDROcodone-acetaminophen (NORCO) 10-325 MG per tablet Take 1 tablet by mouth every 4 (four) hours as needed.  12/08/13  Yes Historical Provider, MD  hyoscyamine (LEVSIN SL) 0.125 MG SL tablet PLACE 1 TABLET (0.125 MG TOTAL) UNDER THE TONGUE EVERY 4 (FOUR) HOURS AS NEEDED FOR CRAMPING. 04/25/13  Yes Mahala Menghini, PA-C  KLOR-CON M10 10 MEQ tablet Take 10 mEq by mouth once.  12/02/13  Yes Historical Provider, MD  lansoprazole (PREVACID) 30 MG capsule TAKE 1 TABLET BY MOUTH DAILY 01/10/13  Yes Mahala Menghini, PA-C  levothyroxine (SYNTHROID, LEVOTHROID) 50 MCG tablet Take 50 mcg by mouth every morning.  07/28/11  Yes Historical Provider, MD  Probiotic Product (PROBIOTIC FORMULA PO) Take 1 capsule by mouth daily.    Yes Historical Provider, MD  promethazine (PHENERGAN) 25 MG tablet Take 25 mg by mouth every 6 (six) hours as  needed.  12/08/13  Yes Historical Provider, MD  rOPINIRole (REQUIP) 1 MG tablet Take 1 mg by mouth at bedtime.     Yes Historical Provider, MD  tiotropium (SPIRIVA) 18 MCG inhalation capsule Place 18 mcg into inhaler and inhale every evening.    Yes Historical Provider, MD  dronabinol (MARINOL) 2.5 MG capsule Take 1 capsule (2.5 mg total) by mouth 2 (two) times daily before a meal. 09/07/12   Orvil Feil, NP  sucralfate (CARAFATE) 1 GM/10ML suspension Take 10 mLs (1 g total) by mouth 4 (four) times daily.  12/13/12   Mahala Menghini, PA-C    Allergies as of 12/09/2013 - Review Complete 08/24/2012  Allergen Reaction Noted  . Metoclopramide hcl Other (See Comments)   . Penicillins Anaphylaxis   . Zofran Other (See Comments) 11/26/2010    Family History  Problem Relation Age of Onset  . Cancer Father     ?leukemia  . Cancer Sister   . Colon cancer Brother 42  . Colon cancer Sister 24    recently diagnosed    History   Social History  . Marital Status: Married    Spouse Name: N/A    Number of Children: 3  . Years of Education: N/A   Occupational History  .     Social History Main Topics  . Smoking status: Current Every Day Smoker -- 2.00 packs/day    Types: Cigarettes  . Smokeless tobacco: Not on file  . Alcohol Use: No  . Drug Use: No  . Sexual Activity: Not Currently   Other Topics Concern  . Not on file   Social History Narrative  . No narrative on file    Review of Systems: See HPI, otherwise negative ROS  Physical Exam: BP 115/69  Pulse 99  Temp(Src) 97.6 F (36.4 C) (Oral)  Ht 5\' 3"  (1.6 m)  Wt 191 lb 6.4 oz (86.818 kg)  BMI 33.91 kg/m2 General:   Alert,  Well-developed, well-nourished, pleasant and cooperative in NAD Skin:  Intact without significant lesions or rashes. Eyes:  Sclera clear, no icterus.   Conjunctiva pink. Ears:  Normal auditory acuity. Nose:  No deformity, discharge,  or lesions. Mouth:  No deformity or lesions. Neck:  Supple; no masses or thyromegaly. No significant cervical adenopathy. Lungs:  Clear throughout to auscultation.   No wheezes, crackles, or rhonchi. No acute distress. Heart:  Regular rate and rhythm; no murmurs, clicks, rubs,  or gallops. Abdomen: Nondistended. Positive bowel sounds. Minimal left lower quadrant tenderness to palpation. No significant CVA tenderness. Pulses:  Normal pulses noted. Extremities:  Without clubbing or edema.  Impression:   68 year old lady with left lower quadrant left flank pain of several  months duration. Nonprogressive. Does have a positional component. May well be musculoskeletal. No urinary tract symptoms apparent. Her other GI symptoms appear to be doing fairly well including GERD, gastroparesis. Importance of compliance and followup with [place] on a regular basis to assist her with her health care needs reviewed. Likely has a fatty liver. LFTs have more normalized. Personal history of colonic adenoma and positive family history colon cancer in a first relative now.  Recommendations:  Loose weight; regular aerobic exercise  CT contrast abdomen and pelvis - left-sided abdominal pain  Urinalysis  Will prescribe medication for GERD once we find out about insurance  Surveillance colonoscopy 2018.  Further recommendations to follow    Notice: This dictation was prepared with Dragon dictation along with smaller phrase technology. Any transcriptional errors  that result from this process are unintentional and may not be corrected upon review.

## 2013-12-09 NOTE — Patient Instructions (Addendum)
Loose weight; regular aerobic exercise  CT contrast abdomen and pelvis - left-sided abdominal pain  Urinalysis  Will prescribe medication for GERD once we find out about insurance  Further recommendations to follow

## 2013-12-10 LAB — URINALYSIS
Bilirubin Urine: NEGATIVE
Glucose, UA: NEGATIVE mg/dL
HGB URINE DIPSTICK: NEGATIVE
Ketones, ur: NEGATIVE mg/dL
NITRITE: NEGATIVE
Protein, ur: NEGATIVE mg/dL
Specific Gravity, Urine: 1.006 (ref 1.005–1.030)
Urobilinogen, UA: 0.2 mg/dL (ref 0.0–1.0)
pH: 5.5 (ref 5.0–8.0)

## 2013-12-12 ENCOUNTER — Telehealth: Payer: Self-pay | Admitting: Internal Medicine

## 2013-12-12 MED ORDER — OMEPRAZOLE 20 MG PO CPDR: 20.0000 mg | DELAYED_RELEASE_CAPSULE | Freq: Every day | ORAL | Status: DC

## 2013-12-12 NOTE — Telephone Encounter (Signed)
Dr.Rourk- is it ok to send in rx for omeprazole?

## 2013-12-12 NOTE — Telephone Encounter (Signed)
Pt seen [facility]MR on Friday and she said that her pharmacist told her that if we would call in Omeprazole it would be cheaper for her than the other medication. She uses [facility] in [place].

## 2013-12-12 NOTE — Telephone Encounter (Signed)
rx has been sent in to the pharmacy.  

## 2013-12-12 NOTE — Telephone Encounter (Signed)
OK; Omeprazole 20 mg daily disp 30 with 11 refills

## 2013-12-13 ENCOUNTER — Ambulatory Visit (HOSPITAL_COMMUNITY)
Admission: RE | Admit: 2013-12-13 | Discharge: 2013-12-13 | Disposition: A | Discharge status: Home / Self Care | Payer: Medicare HMO | Source: Ambulatory Visit | Attending: Internal Medicine | Admitting: Internal Medicine

## 2013-12-13 DIAGNOSIS — K7689 Other specified diseases of liver: Secondary | ICD-10-CM | POA: Diagnosis not present

## 2013-12-13 DIAGNOSIS — R109 Unspecified abdominal pain: Secondary | ICD-10-CM

## 2013-12-13 DIAGNOSIS — R1032 Left lower quadrant pain: Secondary | ICD-10-CM | POA: Insufficient documentation

## 2013-12-13 MED ORDER — IOHEXOL 300 MG/ML  SOLN
100.0000 mL | Freq: Once | INTRAMUSCULAR | Status: AC | PRN
  Administered 2013-12-13: 100 mL via INTRAVENOUS

## 2013-12-21 ENCOUNTER — Telehealth: Payer: Self-pay | Admitting: Internal Medicine

## 2013-12-21 ENCOUNTER — Encounter: Payer: Self-pay | Admitting: Internal Medicine

## 2013-12-21 NOTE — Progress Notes (Signed)
PATIENT SCHEDULED AND LETTER SENT  °

## 2013-12-21 NOTE — Telephone Encounter (Signed)
I spoke with the pt and gave her the results of her tests.

## 2013-12-21 NOTE — Telephone Encounter (Signed)
Pt called earlier returning JL call. I told her JL was not available at the moment but would take her number to call her back. [phone]

## 2014-01-25 ENCOUNTER — Ambulatory Visit (INDEPENDENT_AMBULATORY_CARE_PROVIDER_SITE_OTHER): Payer: Medicare HMO | Admitting: Gastroenterology

## 2014-01-25 ENCOUNTER — Encounter: Payer: Self-pay | Admitting: Gastroenterology

## 2014-01-25 ENCOUNTER — Other Ambulatory Visit: Payer: Self-pay

## 2014-01-25 VITALS — BP 118/69 | HR 87 | Temp 98.2°F | Ht 63.5 in | Wt 189.2 lb

## 2014-01-25 DIAGNOSIS — R5383 Other fatigue: Secondary | ICD-10-CM

## 2014-01-25 DIAGNOSIS — K760 Fatty (change of) liver, not elsewhere classified: Secondary | ICD-10-CM

## 2014-01-25 DIAGNOSIS — K3184 Gastroparesis: Secondary | ICD-10-CM

## 2014-01-25 DIAGNOSIS — R634 Abnormal weight loss: Secondary | ICD-10-CM

## 2014-01-25 DIAGNOSIS — K219 Gastro-esophageal reflux disease without esophagitis: Secondary | ICD-10-CM

## 2014-01-25 NOTE — Assessment & Plan Note (Signed)
Due for recheck at this time. Discussed need for 10-15 pound weight loss over the next 4-6 months. She voiced interest in dietician consult due to her limited mobility. Agree with need for consult regarding fatty liver and weight loss. Will make arrangements.

## 2014-01-25 NOTE — Progress Notes (Signed)
Primary Care Physician: Alonza Bogus, MD  Primary Gastroenterologist:  Garfield Cornea, MD   Chief Complaint  Patient presents with  . Follow-up    HPI: Tracey Mccullough is a 68 y.o. female here for office visit. She was seen in August 2015 by Dr. Gala Romney. History of multiple no-shows/noncompliance. Complained of GERD, left lower quadrant/left flank pain. History of gastroparesis. Since that visit, she had CT of the abdomen and pelvis with contrast that showed fatty liver but otherwise unremarkable.  Overall nausea is much improved. Eats multiple small meals daily. No large meals. Nausea a lot better. Takes Phenergan 1 per day, 2 at the most.  More concerned about the fatigue. Feels like cannot sit up. Worse over the past couple of weeks. Thought it was related to gastroparesis step 1 diet. ?related to phenergan. Didn't like way Marinol made her feel. Still has some cramping abdominal pain, using Levsin prn. Not always associated with BM. Still has intermittent constipation/diarrhea. miralax helps a lot, takes prn only.    Current Outpatient Prescriptions  Medication Sig Dispense Refill  . amLODipine-benazepril (LOTREL) 10-20 MG per capsule Take 1 capsule by mouth every morning.       . dipyridamole-aspirin (AGGRENOX) 25-200 MG per 12 hr capsule Take 1 capsule by mouth 2 (two) times daily.       . furosemide (LASIX) 40 MG tablet Take 40 mg by mouth daily.       Marland Kitchen HYDROcodone-acetaminophen (NORCO) 10-325 MG per tablet Take 1 tablet by mouth every 4 (four) hours as needed.       . hyoscyamine (LEVSIN SL) 0.125 MG SL tablet PLACE 1 TABLET (0.125 MG TOTAL) UNDER THE TONGUE EVERY 4 (FOUR) HOURS AS NEEDED FOR CRAMPING.  90 tablet  3  . KLOR-CON M10 10 MEQ tablet Take 10 mEq by mouth once.       Marland Kitchen levothyroxine (SYNTHROID, LEVOTHROID) 50 MCG tablet Take 50 mcg by mouth every morning.       Marland Kitchen omeprazole (PRILOSEC) 20 MG capsule Take 1 capsule (20 mg total) by mouth daily.  30 capsule  11  .  Probiotic Product (PROBIOTIC FORMULA PO) Take 1 capsule by mouth daily.       . promethazine (PHENERGAN) 25 MG tablet Take 25 mg by mouth every 6 (six) hours as needed.       Marland Kitchen rOPINIRole (REQUIP) 1 MG tablet Take 1 mg by mouth at bedtime.         No current facility-administered medications for this visit.    Allergies as of 01/25/2014 - Review Complete 01/25/2014  Allergen Reaction Noted  . Metoclopramide hcl Other (See Comments)   . Penicillins Anaphylaxis   . Zofran Other (See Comments) 11/26/2010    ROS:  General: Negative for anorexia, weight loss, fever, chills,  Weakness. + Fatigue ENT: Negative for hoarseness, difficulty swallowing , nasal congestion. CV: Negative for chest pain, angina, palpitations, peripheral edema. +DOE Respiratory: Negative for dyspnea at rest, cough, sputum, wheezing.  +DOE GI: See history of present illness. GU:  Negative for dysuria, hematuria, urinary incontinence, urinary frequency, nocturnal urination.  Endo: Negative for unusual weight change.    Physical Examination:   BP 118/69  Pulse 87  Temp(Src) 98.2 F (36.8 C) (Oral)  Ht 5' 3.5" (1.613 m)  Wt 189 lb 3.2 oz (85.821 kg)  BMI 32.99 kg/m2  General: Well-nourished, well-developed in no acute distress.  Eyes: No icterus. Mouth: Oropharyngeal mucosa moist and pink , no lesions  erythema or exudate. Lungs: Clear to auscultation bilaterally.  Heart: Regular rate and rhythm, no murmurs rubs or gallops.  Abdomen: Bowel sounds are normal, nontender, nondistended, no hepatosplenomegaly or masses, no abdominal bruits or hernia , no rebound or guarding.   Extremities: No lower extremity edema. No clubbing or deformities. Neuro: Alert and oriented x 4   Skin: Warm and dry, no jaundice.   Psych: Alert and cooperative, normal mood and affect.

## 2014-01-25 NOTE — Assessment & Plan Note (Signed)
Doing well on omeprazole.

## 2014-01-25 NOTE — Progress Notes (Signed)
cc'ed to pcp °

## 2014-01-25 NOTE — Assessment & Plan Note (Addendum)
Continue phenergan prn for now. But may be contributing to fatigue. As not tolerated EES, Reglan, Zofran, Marinol in the past. Continue gastroparesis diet.   Alternating diarrhea/constipation/abd cramps responsive to Levsin. Up to date on colonoscopy. CT 11/2013 unremarkable.  Ov in 3 months.

## 2014-01-25 NOTE — Assessment & Plan Note (Signed)
Complains of 2 week history of worsening fatigue. Has been on phenergan chronically. ?side effect. Check thyroid status, renal function, CBC.

## 2014-01-25 NOTE — Patient Instructions (Signed)
1. Please have your blood work done fasting. Nothing to eat or drink after midnight the night before your labs. 2. Dietician consult for fatty liver and weight loss. 3. Return to office in 3 months or sooner if needed.  Fatty Liver Fatty liver is the accumulation of fat in liver cells. It is also called hepatosteatosis or steatohepatitis. It is normal for your liver to contain some fat. If fat is more than 5 to 10% of your liver's weight, you have fatty liver.  There are often no symptoms (problems) for years while damage is still occurring. People often learn about their fatty liver when they have medical tests for other reasons. Fat can damage your liver for years or even decades without causing problems. When it becomes severe, it can cause fatigue, weight loss, weakness, and confusion. This makes you more likely to develop more serious liver problems. The liver is the largest organ in the body. It does a lot of work and often gives no warning signs when it is sick until late in a disease. The liver has many important jobs including:  Breaking down foods.  Storing vitamins, iron, and other minerals.  Making proteins.  Making bile for food digestion.  Breaking down many products including medications, alcohol and some poisons. CAUSES  There are a number of different conditions, medications, and poisons that can cause a fatty liver. Eating too many calories causes fat to build up in the liver. Not processing and breaking fats down normally may also cause this. Certain conditions, such as obesity, diabetes, and high triglycerides also cause this. Most fatty liver patients tend to be middle-aged and over weight.  Some causes of fatty liver are:  Alcohol over consumption.  Malnutrition.  Steroid use.  Valproic acid toxicity.  Obesity.  Cushing's syndrome.  Poisons.  Tetracycline in high dosages.  Pregnancy.  Diabetes.  Hyperlipidemia.  Rapid weight loss. Some people  develop fatty liver even having none of these conditions. SYMPTOMS  Fatty liver most often causes no problems. This is called asymptomatic.  It can be diagnosed with blood tests and also by a liver biopsy.  It is one of the most common causes of minor elevations of liver enzymes on routine blood tests.  Specialized Imaging of the liver using ultrasound, CT (computed tomography) scan, or MRI (magnetic resonance imaging) can suggest a fatty liver but a biopsy is needed to confirm it.  A biopsy involves taking a small sample of liver tissue. This is done by using a needle. It is then looked at under a microscope by a specialist. TREATMENT  It is important to treat the cause. Simple fatty liver without a medical reason may not need treatment.  Weight loss, fat restriction, and exercise in overweight patients produces inconsistent results but is worth trying.  Fatty liver due to alcohol toxicity may not improve even with stopping drinking.  Good control of diabetes may reduce fatty liver.  Lower your triglycerides through diet, medication or both.  Eat a balanced, healthy diet.  Increase your physical activity.  Get regular checkups from a liver specialist.  There are no medical or surgical treatments for a fatty liver or NASH, but improving your diet and increasing your exercise may help prevent or reverse some of the damage. PROGNOSIS  Fatty liver may cause no damage or it can lead to an inflammation of the liver. This is, called steatohepatitis. When it is linked to alcohol abuse, it is called alcoholic steatohepatitis. It often is not  linked to alcohol. It is then called nonalcoholic steatohepatitis, or NASH. Over time the liver may become scarred and hardened. This condition is called cirrhosis. Cirrhosis is serious and may lead to liver failure or cancer. NASH is one of the leading causes of cirrhosis. About 10-20% of Americans have fatty liver and a smaller 2-5% has NASH. Document  Released: 05/23/2005 Document Revised: 06/30/2011 Document Reviewed: 08/17/2013 [facility] Patient Information 2015 [place], [place]. This information is not intended to replace advice given to you by your health care provider. Make sure you discuss any questions you have with your health care provider.

## 2014-01-30 ENCOUNTER — Other Ambulatory Visit: Payer: Self-pay | Admitting: Gastroenterology

## 2014-01-30 LAB — CBC WITH DIFFERENTIAL/PLATELET
BASOS PCT: 1 % (ref 0–1)
Basophils Absolute: 0.1 10*3/uL (ref 0.0–0.1)
EOS ABS: 0.2 10*3/uL (ref 0.0–0.7)
EOS PCT: 3 % (ref 0–5)
HCT: 42 % (ref 36.0–46.0)
Hemoglobin: 14.5 g/dL (ref 12.0–15.0)
LYMPHS ABS: 2.4 10*3/uL (ref 0.7–4.0)
Lymphocytes Relative: 39 % (ref 12–46)
MCH: 30 pg (ref 26.0–34.0)
MCHC: 34.5 g/dL (ref 30.0–36.0)
MCV: 87 fL (ref 78.0–100.0)
Monocytes Absolute: 0.5 10*3/uL (ref 0.1–1.0)
Monocytes Relative: 8 % (ref 3–12)
NEUTROS PCT: 49 % (ref 43–77)
Neutro Abs: 3 10*3/uL (ref 1.7–7.7)
PLATELETS: 238 10*3/uL (ref 150–400)
RBC: 4.83 MIL/uL (ref 3.87–5.11)
RDW: 14.6 % (ref 11.5–15.5)
WBC: 6.1 10*3/uL (ref 4.0–10.5)

## 2014-01-30 LAB — TSH: TSH: 1.303 u[IU]/mL (ref 0.350–4.500)

## 2014-01-31 NOTE — Progress Notes (Signed)
Quick Note:  CBC, TSH normal. Where is the CMET? ______

## 2014-02-02 LAB — COMPREHENSIVE METABOLIC PANEL
ALT: 26 U/L (ref 0–35)
AST: 31 U/L (ref 0–37)
Albumin: 4.4 g/dL (ref 3.5–5.2)
Alkaline Phosphatase: 98 U/L (ref 39–117)
BUN: 11 mg/dL (ref 6–23)
CO2: 26 mEq/L (ref 19–32)
Calcium: 9.5 mg/dL (ref 8.4–10.5)
Chloride: 105 mEq/L (ref 96–112)
Creat: 0.8 mg/dL (ref 0.50–1.10)
Glucose, Bld: 95 mg/dL (ref 70–99)
Potassium: 4.7 mEq/L (ref 3.5–5.3)
Sodium: 139 mEq/L (ref 135–145)
Total Bilirubin: 0.3 mg/dL (ref 0.2–1.2)
Total Protein: 6.5 g/dL (ref 6.0–8.3)

## 2014-02-07 NOTE — Progress Notes (Signed)
Quick Note:  Please let patient know labs were normal. No explanation for fatigue.  Fatigue could be secondary to phenergan use. Would recommend limit as much as possible. ______

## 2014-02-16 ENCOUNTER — Encounter: Payer: Self-pay | Admitting: Internal Medicine

## 2014-03-09 ENCOUNTER — Ambulatory Visit: Payer: Medicare HMO | Admitting: Dietician

## 2014-04-27 ENCOUNTER — Ambulatory Visit (INDEPENDENT_AMBULATORY_CARE_PROVIDER_SITE_OTHER): Payer: Medicare HMO | Admitting: Gastroenterology

## 2014-04-27 ENCOUNTER — Encounter: Payer: Self-pay | Admitting: Gastroenterology

## 2014-04-27 ENCOUNTER — Ambulatory Visit: Payer: Medicare HMO | Admitting: Gastroenterology

## 2014-04-27 VITALS — BP 127/72 | HR 76 | Temp 98.3°F | Ht 63.0 in | Wt 192.0 lb

## 2014-04-27 DIAGNOSIS — K3184 Gastroparesis: Secondary | ICD-10-CM

## 2014-04-27 DIAGNOSIS — K2100 Gastro-esophageal reflux disease with esophagitis, without bleeding: Secondary | ICD-10-CM

## 2014-04-27 DIAGNOSIS — K760 Fatty (change of) liver, not elsewhere classified: Secondary | ICD-10-CM

## 2014-04-27 NOTE — Assessment & Plan Note (Signed)
No evidence of advanced liver disease on prior imaging study. She will be due for labs around April. We will send a reminder letter. Recommend dropping back to approximate 1600 cal per day. Low-fat food choices. Goal of 10-15 pound weight loss prior to next office visit in 6 months. Discussed fatty liver at length, handout provided.

## 2014-04-27 NOTE — Patient Instructions (Signed)
Please take your omeprazole 30 minutes before an evening meal. If you continue to have heartburn let me know and we can adjust your medication further.   You will be due for repeat blood work in 06/2014. We will send a reminder letter.  Return to the office in six months.   Fatty Liver Fatty liver is the accumulation of fat in liver cells. It is also called hepatosteatosis or steatohepatitis. It is normal for your liver to contain some fat. If fat is more than 5 to 10% of your liver's weight, you have fatty liver.  There are often no symptoms (problems) for years while damage is still occurring. People often learn about their fatty liver when they have medical tests for other reasons. Fat can damage your liver for years or even decades without causing problems. When it becomes severe, it can cause fatigue, weight loss, weakness, and confusion. This makes you more likely to develop more serious liver problems. The liver is the largest organ in the body. It does a lot of work and often gives no warning signs when it is sick until late in a disease. The liver has many important jobs including:  Breaking down foods.  Storing vitamins, iron, and other minerals.  Making proteins.  Making bile for food digestion.  Breaking down many products including medications, alcohol and some poisons. CAUSES  There are a number of different conditions, medications, and poisons that can cause a fatty liver. Eating too many calories causes fat to build up in the liver. Not processing and breaking fats down normally may also cause this. Certain conditions, such as obesity, diabetes, and high triglycerides also cause this. Most fatty liver patients tend to be middle-aged and over weight.  Some causes of fatty liver are:  Alcohol over consumption.  Malnutrition.  Steroid use.  Valproic acid toxicity.  Obesity.  Cushing's syndrome.  Poisons.  Tetracycline in high  dosages.  Pregnancy.  Diabetes.  Hyperlipidemia.  Rapid weight loss. Some people develop fatty liver even having none of these conditions. SYMPTOMS  Fatty liver most often causes no problems. This is called asymptomatic.  It can be diagnosed with blood tests and also by a liver biopsy.  It is one of the most common causes of minor elevations of liver enzymes on routine blood tests.  Specialized Imaging of the liver using ultrasound, CT (computed tomography) scan, or MRI (magnetic resonance imaging) can suggest a fatty liver but a biopsy is needed to confirm it.  A biopsy involves taking a small sample of liver tissue. This is done by using a needle. It is then looked at under a microscope by a specialist. TREATMENT  It is important to treat the cause. Simple fatty liver without a medical reason may not need treatment.  Weight loss, fat restriction, and exercise in overweight patients produces inconsistent results but is worth trying.  Fatty liver due to alcohol toxicity may not improve even with stopping drinking.  Good control of diabetes may reduce fatty liver.  Lower your triglycerides through diet, medication or both.  Eat a balanced, healthy diet.  Increase your physical activity.  Get regular checkups from a liver specialist.  There are no medical or surgical treatments for a fatty liver or NASH, but improving your diet and increasing your exercise may help prevent or reverse some of the damage. PROGNOSIS  Fatty liver may cause no damage or it can lead to an inflammation of the liver. This is, called steatohepatitis. When it  is linked to alcohol abuse, it is called alcoholic steatohepatitis. It often is not linked to alcohol. It is then called nonalcoholic steatohepatitis, or NASH. Over time the liver may become scarred and hardened. This condition is called cirrhosis. Cirrhosis is serious and may lead to liver failure or cancer. NASH is one of the leading causes of  cirrhosis. About 10-20% of Americans have fatty liver and a smaller 2-5% has NASH. Document Released: 05/23/2005 Document Revised: 06/30/2011 Document Reviewed: 08/17/2013 [facility] Patient Information 2015 [place], [place]. This information is not intended to replace advice given to you by your health care provider. Make sure you discuss any questions you have with your health care provider.  Food Choices for Gastroesophageal Reflux Disease When you have gastroesophageal reflux disease (GERD), the foods you eat and your eating habits are very important. Choosing the right foods can help ease the discomfort of GERD. WHAT GENERAL GUIDELINES DO I NEED TO FOLLOW?  Choose fruits, vegetables, whole grains, low-fat dairy products, and low-fat meat, fish, and poultry.  Limit fats such as oils, salad dressings, butter, nuts, and avocado.  Keep a food diary to identify foods that cause symptoms.  Avoid foods that cause reflux. These may be different for different people.  Eat frequent small meals instead of three large meals each day.  Eat your meals slowly, in a relaxed setting.  Limit fried foods.  Cook foods using methods other than frying.  Avoid drinking alcohol.  Avoid drinking large amounts of liquids with your meals.  Avoid bending over or lying down until 2-3 hours after eating. WHAT FOODS ARE NOT RECOMMENDED? The following are some foods and drinks that may worsen your symptoms: Vegetables Tomatoes. Tomato juice. Tomato and spaghetti sauce. Chili peppers. Onion and garlic. Horseradish. Fruits Oranges, grapefruit, and lemon (fruit and juice). Meats High-fat meats, fish, and poultry. This includes hot dogs, ribs, ham, sausage, salami, and bacon. Dairy Whole milk and chocolate milk. Sour cream. Cream. Butter. Ice cream. Cream cheese.  Beverages Coffee and tea, with or without caffeine. Carbonated beverages or energy drinks. Condiments Hot sauce. Barbecue sauce.   Sweets/Desserts Chocolate and cocoa. Donuts. Peppermint and spearmint. Fats and Oils High-fat foods, including [place] fries and potato chips. Other Vinegar. Strong spices, such as black pepper, white pepper, red pepper, cayenne, curry powder, cloves, ginger, and chili powder. The items listed above may not be a complete list of foods and beverages to avoid. Contact your dietitian for more information. Document Released: 04/07/2005 Document Revised: 04/12/2013 Document Reviewed: 02/09/2013 [facility] Patient Information 2015 [place], [place]. This information is not intended to replace advice given to you by your health care provider. Make sure you discuss any questions you have with your health care provider.

## 2014-04-27 NOTE — Assessment & Plan Note (Signed)
Mostly nocturnal symptoms. Advised to take omeprazole 30 minutes before her evening meal. If continues to remain symptomatic she will notify the office. Discussed antireflux measures and handout provided.

## 2014-04-27 NOTE — Assessment & Plan Note (Signed)
Doing well. Recommend backing down on Phenergan to 12.5 mg as needed. Continue gastroparesis diet.

## 2014-04-27 NOTE — Progress Notes (Signed)
Primary Care Physician: Alonza Bogus, MD  Primary Gastroenterologist:  Garfield Cornea, MD   Chief Complaint  Patient presents with  . Other    fatty liver, gastroparesis    HPI: Tracey Mccullough is a 69 y.o. female here for follow-up of fatty liver, GERD and gastroparesis. Last seen back in October. Weight is up an additional 3 pounds. Labs checked back in October, see below for details. Insurance would not cover consult with the dietitian. She's been trying to follow 1900-calorie diet. Came off the diet over the holidays however. Very limited mobility with regards to exercise. Feels much better however. Bowel movements are regular with MiraLAX. Gastroparesis was a problem lately. She is trying to watch her diet closely. She still uses Phenergan about once per day. Fatigue improved with backing off of Phenergan. Continues to have some nocturnal heartburn, takes omeprazole right before bed.     Current Outpatient Prescriptions  Medication Sig Dispense Refill  . amLODipine-benazepril (LOTREL) 10-20 MG per capsule Take 1 capsule by mouth every morning.     . dipyridamole-aspirin (AGGRENOX) 25-200 MG per 12 hr capsule Take 1 capsule by mouth 2 (two) times daily.     . furosemide (LASIX) 40 MG tablet Take 40 mg by mouth daily.     Marland Kitchen HYDROcodone-acetaminophen (NORCO) 10-325 MG per tablet Take 1 tablet by mouth every 4 (four) hours as needed.     . hyoscyamine (LEVSIN SL) 0.125 MG SL tablet PLACE 1 TABLET (0.125 MG TOTAL) UNDER THE TONGUE EVERY 4 (FOUR) HOURS AS NEEDED FOR CRAMPING. 90 tablet 3  . KLOR-CON M10 10 MEQ tablet Take 10 mEq by mouth once.     Marland Kitchen levothyroxine (SYNTHROID, LEVOTHROID) 50 MCG tablet Take 50 mcg by mouth every morning.     Marland Kitchen omeprazole (PRILOSEC) 20 MG capsule Take 1 capsule (20 mg total) by mouth daily. 30 capsule 11  . Probiotic Product (PROBIOTIC FORMULA PO) Take 1 capsule by mouth daily.     . promethazine (PHENERGAN) 25 MG tablet Take 25 mg by mouth every  6 (six) hours as needed.     Marland Kitchen rOPINIRole (REQUIP) 1 MG tablet Take 1 mg by mouth at bedtime.       No current facility-administered medications for this visit.    Allergies as of 04/27/2014 - Review Complete 04/27/2014  Allergen Reaction Noted  . Metoclopramide hcl Other (See Comments)   . Penicillins Anaphylaxis   . Zofran Other (See Comments) 11/26/2010    ROS:  General: Negative for anorexia, weight loss, fever, chills, fatigue, weakness. ENT: Negative for hoarseness, difficulty swallowing , nasal congestion. CV: Negative for chest pain, angina, palpitations, dyspnea on exertion, peripheral edema.  Respiratory: Negative for dyspnea at rest, dyspnea on exertion, cough, sputum, wheezing.  GI: See history of present illness. GU:  Negative for dysuria, hematuria, urinary incontinence, urinary frequency, nocturnal urination.  Endo: Negative for unusual weight change.    Physical Examination:   BP 127/72 mmHg  Pulse 76  Temp(Src) 98.3 F (36.8 C)  Ht 5\' 3"  (1.6 m)  Wt 192 lb (87.091 kg)  BMI 34.02 kg/m2  General: Well-nourished, well-developed in no acute distress.  Eyes: No icterus. Mouth: Oropharyngeal mucosa moist and pink , no lesions erythema or exudate. Lungs: Clear to auscultation bilaterally.  Heart: Regular rate and rhythm, no murmurs rubs or gallops.  Abdomen: Bowel sounds are normal, nontender, nondistended, no hepatosplenomegaly or masses, no abdominal bruits or hernia , no rebound or guarding.  Extremities: No lower extremity edema. No clubbing or deformities. Neuro: Alert and oriented x 4   Skin: Warm and dry, no jaundice.   Psych: Alert and cooperative, normal mood and affect.  Labs:  Lab Results  Component Value Date   WBC 6.1 01/30/2014   HGB 14.5 01/30/2014   HCT 42.0 01/30/2014   MCV 87.0 01/30/2014   PLT 238 01/30/2014   Lab Results  Component Value Date   CREATININE 0.80 01/30/2014   BUN 11 01/30/2014   NA 139 01/30/2014   K 4.7 01/30/2014    CL 105 01/30/2014   CO2 26 01/30/2014   Lab Results  Component Value Date   ALT 26 01/30/2014   AST 31 01/30/2014   ALKPHOS 98 01/30/2014   BILITOT 0.3 01/30/2014   Lab Results  Component Value Date   TSH 1.303 01/30/2014    Imaging Studies: No results found.

## 2014-04-27 NOTE — Progress Notes (Signed)
cc'ed to pcp °

## 2014-04-28 ENCOUNTER — Telehealth: Payer: Self-pay

## 2014-04-28 ENCOUNTER — Other Ambulatory Visit: Payer: Self-pay

## 2014-04-28 DIAGNOSIS — K760 Fatty (change of) liver, not elsewhere classified: Secondary | ICD-10-CM

## 2014-04-28 MED ORDER — HYOSCYAMINE SULFATE 0.125 MG SL SUBL: SUBLINGUAL_TABLET | SUBLINGUAL | Status: DC

## 2014-04-28 NOTE — Telephone Encounter (Signed)
Pt said she is running low on Hyoscyamine and could you please send in refills to [facility].

## 2014-04-28 NOTE — Telephone Encounter (Signed)
Completed.

## 2014-05-03 NOTE — Telephone Encounter (Signed)
Pharmacy called and stated that Tracey Mccullough is not covered. Can we give pt something else.

## 2014-05-03 NOTE — Telephone Encounter (Signed)
Prior authorization is in process

## 2014-05-11 NOTE — Telephone Encounter (Signed)
PA was approved and pharmacy is aware. I also called and informed the pt.

## 2014-05-24 ENCOUNTER — Telehealth: Payer: Self-pay | Admitting: Internal Medicine

## 2014-05-24 NOTE — Telephone Encounter (Signed)
Can try omeprazole 20mg  before breakfast and 20mg  before evening meal if she has not. It will take at least a week to take affect.  Previously took lansoprazole, ?worked? Strange side effects on protonix before.  Has she ever tried Aciphex or Dexilant or Nexium?

## 2014-05-24 NOTE — Telephone Encounter (Signed)
Routing to LSL- do you want to adjust her omeprazole?

## 2014-05-24 NOTE — Telephone Encounter (Signed)
Tried to call pt- phone was busy. 

## 2014-05-24 NOTE — Telephone Encounter (Signed)
LSL PUT PATIENT ON OMAPREZOLE AND WANTED HER TO LET us KNOW IF IT WORKED.  PATIENT CALLED STATING THAT IT IS NOT WORKING.  TAKING ONE A DAY AND HAS TRIED 2 A DAY.  WAS TOLD SHE WOULD INCREASE MEDICATION OR CHANGE AND NOW PATIENT STOMACH IS BURNING   PLEASE ADVISE

## 2014-05-24 NOTE — Telephone Encounter (Signed)
Called pt- spoke with her husband because pt was asleep. She only tried bid omeprazole for a couple of days. He is going to ask her to try it for about a week and let us know how its doing or if she wants to change medications they will let me know.

## 2014-05-25 NOTE — Telephone Encounter (Signed)
Routing to LSL for review.

## 2014-05-25 NOTE — Telephone Encounter (Signed)
Sounds good. Need to give medication time to work.

## 2014-06-12 ENCOUNTER — Other Ambulatory Visit: Payer: Self-pay

## 2014-06-12 DIAGNOSIS — K760 Fatty (change of) liver, not elsewhere classified: Secondary | ICD-10-CM

## 2014-06-28 ENCOUNTER — Telehealth: Payer: Self-pay | Admitting: Internal Medicine

## 2014-06-28 NOTE — Telephone Encounter (Signed)
Pt called today and said that she had labs done and it's been over 10 days. She is asking if the results are available. Please call her at [phone]

## 2014-06-28 NOTE — Telephone Encounter (Signed)
Spoke with the pt because we do not have any bloodwork results in her chart. She said she had them done at [place] office and he was supposed to send them to [place]. I have checked all of the providers offices and we dont have them.   Manuela Schwartz, please request recent labs. Thanks.

## 2014-06-28 NOTE — Telephone Encounter (Signed)
Faxed request to PCP to send [place] recent labs

## 2014-07-03 ENCOUNTER — Telehealth: Payer: Self-pay

## 2014-07-03 NOTE — Telephone Encounter (Signed)
Pt called and states that she is passing her Potassium pill whole and that she has made Dr. Luan Pulling aware and they are going to change that to a powder. She is concerned about her other pills not being absorbed like the potassium.

## 2014-07-07 NOTE — Telephone Encounter (Signed)
She should be fine. Let us know if she's having any worsening symtoms despite taking her medications as ordered. It may be just because the potassium pill is so large.

## 2014-07-10 NOTE — Telephone Encounter (Signed)
Called and spoke with Tracey Mccullough (pts husband). Told him the message from the NP. He is aware and will relay message to pt.

## 2014-08-01 ENCOUNTER — Telehealth: Payer: Self-pay | Admitting: Gastroenterology

## 2014-08-01 NOTE — Telephone Encounter (Signed)
Please let patient know received a copy of her LFTs from Dr. Luan Pulling dated 06/19/2014 Total bilirubin 0.5, alkaline phosphatase 108, AST 31, ALT 30, albumin 4.8. Hemoglobin 15.3, hematocrit 44.3, platelets 291,000, creatinine 0.77.  OV 10/2014 as planned.

## 2014-08-01 NOTE — Telephone Encounter (Signed)
Letter mailed to the pt. 

## 2014-08-23 ENCOUNTER — Encounter: Payer: Self-pay | Admitting: Internal Medicine

## 2014-10-26 ENCOUNTER — Ambulatory Visit: Payer: Medicare HMO | Admitting: Gastroenterology

## 2014-10-30 ENCOUNTER — Encounter: Payer: Self-pay | Admitting: Gastroenterology

## 2014-10-30 ENCOUNTER — Ambulatory Visit (INDEPENDENT_AMBULATORY_CARE_PROVIDER_SITE_OTHER): Payer: Medicare HMO | Admitting: Gastroenterology

## 2014-10-30 VITALS — BP 123/65 | HR 95 | Temp 97.6°F | Ht 63.0 in | Wt 177.2 lb

## 2014-10-30 DIAGNOSIS — K5900 Constipation, unspecified: Secondary | ICD-10-CM | POA: Diagnosis not present

## 2014-10-30 DIAGNOSIS — K219 Gastro-esophageal reflux disease without esophagitis: Secondary | ICD-10-CM

## 2014-10-30 DIAGNOSIS — K760 Fatty (change of) liver, not elsewhere classified: Secondary | ICD-10-CM

## 2014-10-30 DIAGNOSIS — R5383 Other fatigue: Secondary | ICD-10-CM

## 2014-10-30 DIAGNOSIS — K3184 Gastroparesis: Secondary | ICD-10-CM | POA: Diagnosis not present

## 2014-10-30 NOTE — Patient Instructions (Signed)
1. Increase your dietary protein, especially at lunch time.  Good source of protein with fewer calories, Dannon Light and Fit Greek Yogurt vanilla flavored and then added teapsoon or two of peanut butter.  2. I will review your thyroid labs from Dr. Luan Pulling and provide further recommendations.  3. Return to the office in six months.  4. Continue with your weight loss efforts.

## 2014-10-30 NOTE — Progress Notes (Signed)
Primary Care Physician: Alonza Bogus, MD  Primary Gastroenterologist:  Garfield Cornea, MD   Chief Complaint  Patient presents with  . Fatty Liver    HPI: Tracey Mccullough is a 69 y.o. female here follow up GERD, fatty liver, gastroparesis. Last seen in 04/2014. Weight is down 15 pounds. States she's hardly eating. Has to starve to lose weight. Eats Carnation instant breakfast. Usually a sugar-free pudding for lunch or applesauce. Eats dinner every night, such as Lean Cuisine.  Complains of fatigue. Recently had to increase Synthroid from 50-75 g daily states it didn't seem to help. Having sweats and cold chills intermittently like she is going to the change again. Has to make herself do anything. Discussed with Dr. Luan Pulling. He advised taking Women's daily vitamins.   Taking MiraLAX daily to regulate bowel movements. No melena, brbpr. Omeprazole daily. Helping heartburn a lot. Rarley using Levsin. Rarely takes hydrocodone or Phenergan because she realizes it makes her feel tired. Denies abdominal pain, melena, rectal bleeding, dysphagia.     Current Outpatient Prescriptions  Medication Sig Dispense Refill  . amLODipine-benazepril (LOTREL) 10-20 MG per capsule Take 1 capsule by mouth every morning.     . dipyridamole-aspirin (AGGRENOX) 25-200 MG per 12 hr capsule Take 1 capsule by mouth 2 (two) times daily.     . furosemide (LASIX) 40 MG tablet Take 40 mg by mouth daily.     Marland Kitchen HYDROcodone-acetaminophen (NORCO) 10-325 MG per tablet Take 1 tablet by mouth every 4 (four) hours as needed.     . hyoscyamine (LEVSIN SL) 0.125 MG SL tablet PLACE 1 TABLET (0.125 MG TOTAL) UNDER THE TONGUE EVERY 4 (FOUR) HOURS AS NEEDED FOR CRAMPING. 90 tablet 3  . KLOR-CON M10 10 MEQ tablet Take 10 mEq by mouth once.     Marland Kitchen levothyroxine (SYNTHROID, LEVOTHROID) 50 MCG tablet Take 75 mcg by mouth every morning.     . Multiple Vitamin (MULTIVITAMIN) capsule Take 1 capsule by mouth daily.    Marland Kitchen omeprazole  (PRILOSEC) 20 MG capsule Take 1 capsule (20 mg total) by mouth daily. 30 capsule 11  . Probiotic Product (PROBIOTIC FORMULA PO) Take 1 capsule by mouth daily.     . promethazine (PHENERGAN) 25 MG tablet Take 25 mg by mouth every 6 (six) hours as needed.     Marland Kitchen rOPINIRole (REQUIP) 1 MG tablet Take 1 mg by mouth at bedtime.       No current facility-administered medications for this visit.    Allergies as of 10/30/2014 - Review Complete 10/30/2014  Allergen Reaction Noted  . Metoclopramide hcl Other (See Comments)   . Penicillins Anaphylaxis   . Zofran Other (See Comments) 11/26/2010    ROS:  General: Negative for anorexia, weight loss, fever, chills. + fatigue, weakness. ENT: Negative for hoarseness, difficulty swallowing , nasal congestion. CV: Negative for chest pain, angina, palpitations, dyspnea on exertion, peripheral edema.  Respiratory: Negative for dyspnea at rest, dyspnea on exertion, cough, sputum, wheezing.  GI: See history of present illness. GU:  Negative for dysuria, hematuria, urinary incontinence, urinary frequency, nocturnal urination.  Endo: Negative for unusual weight change.    Physical Examination:   BP 123/65 mmHg  Pulse 95  Temp(Src) 97.6 F (36.4 C) (Oral)  Ht 5\' 3"  (1.6 m)  Wt 177 lb 3.2 oz (80.377 kg)  BMI 31.40 kg/m2  General: Well-nourished, well-developed in no acute distress.  Eyes: No icterus. Mouth: Oropharyngeal mucosa moist and pink , no lesions erythema or  exudate. Lungs: Clear to auscultation bilaterally.  Heart: Regular rate and rhythm, no murmurs rubs or gallops.  Abdomen: Bowel sounds are normal, nontender, nondistended, no hepatosplenomegaly or masses, no abdominal bruits or hernia , no rebound or guarding.   Extremities: No lower extremity edema. No clubbing or deformities. Neuro: Alert and oriented x 4   Skin: Warm and dry, no jaundice.   Psych: Alert and cooperative, normal mood and affect.  Imaging Studies: No results  found.   Impression/plan 69 year old female doing fairly well from a GI standpoint. Managing gastroparesis basically with diet. Previously did not tolerate Reglan. Taking Phenergan as needed. Typical GERD well controlled. History of fatty liver, labs in February were unremarkable. She has successfully lost 15 pounds in the past 6 months although dietary changes are likely affecting her energy level. She is basically getting very limited amounts of protein in her diet. Long discussion with patient today regarding needs to consume more protein and eat more regularly throughout the day.  With regards to fatigue, likely multifactorial. She has COPD which is likely contributing. Also they're concerned that she may need increased Synthroid dosage. Retrieve records for review. Further recommendations to follow. Suspect adding dietary protein will help her energy level as well.  Return to the office in 6 months or sooner if needed.

## 2014-11-03 NOTE — Progress Notes (Signed)
CC'ED TO PCP 

## 2014-11-05 NOTE — Progress Notes (Signed)
Please let patient know her TSH was in a good range. PCP likely would not advice increasing given most recent lab value.  Recommendations per OV except let's have her come back and see RMR only in 4 months for follow up.

## 2014-11-07 NOTE — Progress Notes (Signed)
Tried to call pt- NA- LMOM with instructions.  

## 2015-01-03 ENCOUNTER — Other Ambulatory Visit: Payer: Self-pay | Admitting: Internal Medicine

## 2015-02-26 DIAGNOSIS — H40013 Open angle with borderline findings, low risk, bilateral: Secondary | ICD-10-CM | POA: Diagnosis not present

## 2015-03-22 DIAGNOSIS — M6281 Muscle weakness (generalized): Secondary | ICD-10-CM | POA: Diagnosis not present

## 2015-03-22 DIAGNOSIS — M129 Arthropathy, unspecified: Secondary | ICD-10-CM | POA: Diagnosis not present

## 2015-03-22 DIAGNOSIS — J441 Chronic obstructive pulmonary disease with (acute) exacerbation: Secondary | ICD-10-CM | POA: Diagnosis not present

## 2015-03-22 DIAGNOSIS — Z23 Encounter for immunization: Secondary | ICD-10-CM | POA: Diagnosis not present

## 2015-03-22 DIAGNOSIS — I10 Essential (primary) hypertension: Secondary | ICD-10-CM | POA: Diagnosis not present

## 2015-05-02 ENCOUNTER — Ambulatory Visit: Payer: Medicare HMO | Admitting: Gastroenterology

## 2015-05-08 ENCOUNTER — Ambulatory Visit: Payer: Medicare HMO | Admitting: Internal Medicine

## 2015-05-15 ENCOUNTER — Encounter: Payer: Self-pay | Admitting: Internal Medicine

## 2015-05-15 ENCOUNTER — Ambulatory Visit (INDEPENDENT_AMBULATORY_CARE_PROVIDER_SITE_OTHER): Payer: Medicare HMO | Admitting: Internal Medicine

## 2015-05-15 VITALS — BP 135/79 | HR 113 | Temp 98.3°F | Ht 63.0 in | Wt 186.8 lb

## 2015-05-15 DIAGNOSIS — K219 Gastro-esophageal reflux disease without esophagitis: Secondary | ICD-10-CM | POA: Diagnosis not present

## 2015-05-15 DIAGNOSIS — K589 Irritable bowel syndrome without diarrhea: Secondary | ICD-10-CM | POA: Diagnosis not present

## 2015-05-15 DIAGNOSIS — K3184 Gastroparesis: Secondary | ICD-10-CM | POA: Diagnosis not present

## 2015-05-15 NOTE — Patient Instructions (Signed)
Continue Nexium 20 mg daily  Continue Levsin as needed  Continue on a gastroparesis diet  Loose 15 pounds in the next 6 months  Office visit in 6 months

## 2015-05-15 NOTE — Progress Notes (Signed)
Primary Care Physician:  Alonza Bogus, MD Primary Gastroenterologist:  Dr. Gala Romney  Pre-Procedure History & Physical: HPI:  Tracey Mccullough is a 70 y.o. female here for IBS and GERD/gastroparesis. She is intermittently nauseated however she does not vomit. Reflux symptoms well controlled on Nexium 20 mg daily. No dysphagia.Rarely takes hydrocodone. Previously noted to be intolerant to Reglan. Has a loose non-bloody stool about a third time about 10 days out of 30 today she takes Levsin sublingually with good results. She has gained 10 pounds since her last office visit.  Past Medical History  Diagnosis Date  . Hypertension   . Arthritis   . COPD (chronic obstructive pulmonary disease) ([place])   . Hypercholesterolemia   . Restless leg syndrome   . Stroke ([place])   . Acid reflux   . Irritable bowel syndrome (IBS)   . Gastroparesis     INTOLERANT TO REGLAN; On marinol  . Depression   . GERD (gastroesophageal reflux disease)   . Schatzki's ring     EGD 1/12  . Hypokalemia 11/01/10    requiring admission  . Thyroid disease   . Hemorrhoids 09/01/2011    external    Past Surgical History  Procedure Laterality Date  . Cholecystectomy  1978  . Tubal ligation  1972  . Carpal tunnel release      bilateral  . Fracture surgery  2011    right arm  . Esophagogastroduodenoscopy  04/2010    Schatzki ring s/p dilation, minimal retained food. Versed 7/Demeral 150/Phenergan12.5.  . Kidney stone surgery  2008  . Colonoscopy  09/2006    hemorrhoids  . Colonoscopy  09/01/2011    Dr. Camillia Herter hemorrhoids, tubular adenoma. next tcs 08/2016  . Esophagogastroduodenoscopy  09/01/2011    Dr. Leodis Binet esophageal web and schatzki's ring, small hiatal hernia, antral erosions-bx-mild chronic inflammation. circumferential distal esophageal erosions    Prior to Admission medications   Medication Sig Start Date End Date Taking? Authorizing Provider  ALPRAZolam (XANAX) 1 MG tablet TK 1 T PO  TID PRN FOR ANXIETY 05/01/15  Yes Historical Provider, MD  amLODipine-benazepril (LOTREL) 10-20 MG per capsule Take 1 capsule by mouth every morning.    Yes Historical Provider, MD  furosemide (LASIX) 40 MG tablet Take 40 mg by mouth daily.  11/17/13  Yes Historical Provider, MD  HYDROcodone-acetaminophen (NORCO) 10-325 MG per tablet Take 1 tablet by mouth every 4 (four) hours as needed.  12/08/13  Yes Historical Provider, MD  hyoscyamine (LEVSIN SL) 0.125 MG SL tablet PLACE 1 TABLET (0.125 MG TOTAL) UNDER THE TONGUE EVERY 4 (FOUR) HOURS AS NEEDED FOR CRAMPING. 04/28/14  Yes Orvil Feil, NP  KLOR-CON M10 10 MEQ tablet Take 10 mEq by mouth once.  12/02/13  Yes Historical Provider, MD  levothyroxine (SYNTHROID, LEVOTHROID) 75 MCG tablet TK 1 T PO QD 04/23/15  Yes Historical Provider, MD  Multiple Vitamin (MULTIVITAMIN) capsule Take 1 capsule by mouth daily.   Yes Historical Provider, MD  omeprazole (PRILOSEC) 20 MG capsule TAKE 1 CAPSULE BY MOUTH EVERY DAY 01/04/15  Yes Orvil Feil, NP  Probiotic Product (PROBIOTIC FORMULA PO) Take 1 capsule by mouth daily.    Yes Historical Provider, MD  promethazine (PHENERGAN) 25 MG tablet Take 25 mg by mouth every 6 (six) hours as needed.  12/08/13  Yes Historical Provider, MD  rOPINIRole (REQUIP) 1 MG tablet Take 1 mg by mouth at bedtime.     Yes Historical Provider, MD  dipyridamole-aspirin (AGGRENOX) 25-200 MG  per 12 hr capsule Take 1 capsule by mouth 2 (two) times daily. Reported on 05/15/2015    Historical Provider, MD    Allergies as of 05/15/2015 - Review Complete 05/15/2015  Allergen Reaction Noted  . Metoclopramide hcl Other (See Comments)   . Penicillins Anaphylaxis   . Zofran Other (See Comments) 11/26/2010    Family History  Problem Relation Age of Onset  . Cancer Father     ?leukemia  . Cancer Sister   . Colon cancer Brother 59  . Colon cancer Sister 58    recently diagnosed    Social History   Social History  . Marital Status: Married     Spouse Name: N/A  . Number of Children: 3  . Years of Education: N/A   Occupational History  .     Social History Main Topics  . Smoking status: Current Every Day Smoker -- 2.00 packs/day    Types: Cigarettes  . Smokeless tobacco: Not on file     Comment: one pack daily  . Alcohol Use: No  . Drug Use: No  . Sexual Activity: Not Currently   Other Topics Concern  . Not on file   Social History Narrative    Review of Systems: See HPI, otherwise negative ROS  Physical Exam: BP 135/79 mmHg  Pulse 113  Temp(Src) 98.3 F (36.8 C) (Oral)  Ht 5\' 3"  (1.6 m)  Wt 186 lb 12.8 oz (84.732 kg)  BMI 33.10 kg/m2 General:   pleasant and cooperative in NAD Neck:  Supple; no masses or thyromegaly. No significant cervical adenopathy. Abdomen: Non-distended, normal bowel sounds.  Soft and nontender without appreciable mass or hepatosplenomegaly.  Extremities:  Without clubbing or edema.  Impression:  Pleasant 70 year old lady with GERD, gastroparesis and IBS-D. Has IBS symptoms about a third of the time(diarrhea) which are manageable with Levsin.  GERD symptoms well controlled on omeprazole. On gastroparesis and managed fairly well the acid suppression therapy and a gastroparesis diet. Weight gain continues to be an issue.  Recommendations: Continue Nexium 20 mg daily  Continue Levsin as needed  Continue on a gastroparesis diet  Loose 15 pounds in the next 6 months  Office visit in 6 months      Notice: This dictation was prepared with Dragon dictation along with smaller phrase technology. Any transcriptional errors that result from this process are unintentional and may not be corrected upon review.

## 2015-06-11 ENCOUNTER — Ambulatory Visit (HOSPITAL_COMMUNITY): Payer: Medicare HMO | Attending: Pulmonary Disease

## 2015-06-20 DIAGNOSIS — I6350 Cerebral infarction due to unspecified occlusion or stenosis of unspecified cerebral artery: Secondary | ICD-10-CM | POA: Diagnosis not present

## 2015-06-20 DIAGNOSIS — K3184 Gastroparesis: Secondary | ICD-10-CM | POA: Diagnosis not present

## 2015-06-20 DIAGNOSIS — I10 Essential (primary) hypertension: Secondary | ICD-10-CM | POA: Diagnosis not present

## 2015-06-20 DIAGNOSIS — J449 Chronic obstructive pulmonary disease, unspecified: Secondary | ICD-10-CM | POA: Diagnosis not present

## 2015-07-16 ENCOUNTER — Other Ambulatory Visit: Payer: Self-pay | Admitting: Gastroenterology

## 2015-07-17 ENCOUNTER — Other Ambulatory Visit: Payer: Self-pay | Admitting: Gastroenterology

## 2015-09-07 ENCOUNTER — Other Ambulatory Visit: Payer: Self-pay | Admitting: Gastroenterology

## 2015-09-12 ENCOUNTER — Encounter (HOSPITAL_COMMUNITY): Payer: Self-pay | Admitting: Emergency Medicine

## 2015-09-12 ENCOUNTER — Emergency Department (HOSPITAL_COMMUNITY)
Admission: EM | Admit: 2015-09-12 | Discharge: 2015-09-12 | Disposition: A | Discharge status: Home / Self Care | Payer: Medicare HMO | Attending: Emergency Medicine | Admitting: Emergency Medicine

## 2015-09-12 ENCOUNTER — Emergency Department (HOSPITAL_COMMUNITY): Payer: Medicare HMO

## 2015-09-12 DIAGNOSIS — I10 Essential (primary) hypertension: Secondary | ICD-10-CM | POA: Insufficient documentation

## 2015-09-12 DIAGNOSIS — R69 Illness, unspecified: Secondary | ICD-10-CM | POA: Diagnosis not present

## 2015-09-12 DIAGNOSIS — R5383 Other fatigue: Secondary | ICD-10-CM

## 2015-09-12 DIAGNOSIS — Z79899 Other long term (current) drug therapy: Secondary | ICD-10-CM | POA: Diagnosis not present

## 2015-09-12 DIAGNOSIS — M1990 Unspecified osteoarthritis, unspecified site: Secondary | ICD-10-CM | POA: Diagnosis not present

## 2015-09-12 DIAGNOSIS — F17210 Nicotine dependence, cigarettes, uncomplicated: Secondary | ICD-10-CM | POA: Diagnosis not present

## 2015-09-12 DIAGNOSIS — R590 Localized enlarged lymph nodes: Secondary | ICD-10-CM | POA: Diagnosis not present

## 2015-09-12 DIAGNOSIS — M542 Cervicalgia: Secondary | ICD-10-CM | POA: Diagnosis not present

## 2015-09-12 DIAGNOSIS — J449 Chronic obstructive pulmonary disease, unspecified: Secondary | ICD-10-CM | POA: Insufficient documentation

## 2015-09-12 DIAGNOSIS — R591 Generalized enlarged lymph nodes: Secondary | ICD-10-CM

## 2015-09-12 DIAGNOSIS — Z8673 Personal history of transient ischemic attack (TIA), and cerebral infarction without residual deficits: Secondary | ICD-10-CM | POA: Insufficient documentation

## 2015-09-12 LAB — CBC WITH DIFFERENTIAL/PLATELET
Basophils Absolute: 0.1 10*3/uL (ref 0.0–0.1)
Basophils Relative: 1 %
Eosinophils Absolute: 0.2 10*3/uL (ref 0.0–0.7)
Eosinophils Relative: 3 %
HEMATOCRIT: 42.5 % (ref 36.0–46.0)
HEMOGLOBIN: 14.4 g/dL (ref 12.0–15.0)
LYMPHS ABS: 2.8 10*3/uL (ref 0.7–4.0)
LYMPHS PCT: 39 %
MCH: 30 pg (ref 26.0–34.0)
MCHC: 33.9 g/dL (ref 30.0–36.0)
MCV: 88.5 fL (ref 78.0–100.0)
MONOS PCT: 5 %
Monocytes Absolute: 0.3 10*3/uL (ref 0.1–1.0)
NEUTROS ABS: 3.8 10*3/uL (ref 1.7–7.7)
NEUTROS PCT: 52 %
PLATELETS: 230 10*3/uL (ref 150–400)
RBC: 4.8 MIL/uL (ref 3.87–5.11)
RDW: 13.9 % (ref 11.5–15.5)
WBC: 7.3 10*3/uL (ref 4.0–10.5)

## 2015-09-12 LAB — BASIC METABOLIC PANEL
ANION GAP: 6 (ref 5–15)
BUN: 12 mg/dL (ref 6–20)
CHLORIDE: 105 mmol/L (ref 101–111)
CO2: 27 mmol/L (ref 22–32)
CREATININE: 0.61 mg/dL (ref 0.44–1.00)
Calcium: 9.6 mg/dL (ref 8.9–10.3)
GFR calc non Af Amer: >60 mL/min (ref >60)
Glucose, Bld: 103 mg/dL — ABNORMAL HIGH (ref 65–99)
POTASSIUM: 4.6 mmol/L (ref 3.5–5.1)
SODIUM: 138 mmol/L (ref 135–145)

## 2015-09-12 MED ORDER — CLINDAMYCIN HCL 300 MG PO CAPS: 300.0000 mg | ORAL_CAPSULE | Freq: Four times a day (QID) | ORAL | Status: DC

## 2015-09-12 NOTE — ED Notes (Signed)
EDP at bedside  

## 2015-09-12 NOTE — Discharge Instructions (Signed)

## 2015-09-12 NOTE — ED Notes (Signed)
Pt c/o generalized fatigue, swelling and tenderness to left side of throat, headache and joint pain.

## 2015-09-12 NOTE — ED Provider Notes (Signed)
CSN: [id number]     Arrival date & time 09/12/15  [id number] History  By signing my name below, I, Nicole Kindred, attest that this documentation has been prepared under the direction and in the presence of Ripley Fraise, MD.   Electronically Signed: Nicole Kindred, ED Scribe. 09/12/2015. 9:19 AM   Chief Complaint  Patient presents with  . Fatigue    Patient is a 70 y.o. female presenting with pharyngitis. The history is provided by the patient. No language interpreter was used.  Sore Throat This is a new problem. The problem occurs constantly. The problem has not changed since onset.Associated symptoms include headaches and shortness of breath. Pertinent negatives include no chest pain. The symptoms are aggravated by swallowing. Nothing relieves the symptoms.   HPI Comments: EUTHA CORRADO is a 70 y.o. female who presents to the Emergency Department complaining of gradual onset, left sided neck pain and neck swelling, ongoing for about a week. Pt reports associated wheezing, mild shortness of breath, sore throat, fatigue, headache, bilateral hip pain, and generalized body aches. No other associated symptoms noted. Pt states her throat pain is worse with swallowing. No other worsening or alleviating factors noted. Pt denies fevers, chills, nausea, emesis, chest pain, drooling, dental pain, or any other pertinent symptoms. Pt is current smoker.  Past Medical History  Diagnosis Date  . Hypertension   . Arthritis   . COPD (chronic obstructive pulmonary disease) ([place])   . Hypercholesterolemia   . Restless leg syndrome   . Stroke ([place])   . Acid reflux   . Irritable bowel syndrome (IBS)   . Gastroparesis     INTOLERANT TO REGLAN; On marinol  . Depression   . GERD (gastroesophageal reflux disease)   . Schatzki's ring     EGD 1/12  . Hypokalemia 11/01/10    requiring admission  . Thyroid disease   . Hemorrhoids 09/01/2011    external   Past Surgical History  Procedure Laterality Date   . Cholecystectomy  1978  . Tubal ligation  1972  . Carpal tunnel release      bilateral  . Fracture surgery  2011    right arm  . Esophagogastroduodenoscopy  04/2010    Schatzki ring s/p dilation, minimal retained food. Versed 7/Demeral 150/Phenergan12.5.  . Kidney stone surgery  2008  . Colonoscopy  09/2006    hemorrhoids  . Colonoscopy  09/01/2011    Dr. Camillia Herter hemorrhoids, tubular adenoma. next tcs 08/2016  . Esophagogastroduodenoscopy  09/01/2011    Dr. Leodis Binet esophageal web and schatzki's ring, small hiatal hernia, antral erosions-bx-mild chronic inflammation. circumferential distal esophageal erosions   Family History  Problem Relation Age of Onset  . Cancer Father     ?leukemia  . Cancer Sister   . Colon cancer Brother 71  . Colon cancer Sister 73    recently diagnosed   Social History  Substance Use Topics  . Smoking status: Current Every Day Smoker -- 2.00 packs/day    Types: Cigarettes  . Smokeless tobacco: None     Comment: one pack daily  . Alcohol Use: No   OB History    No data available     Review of Systems  Constitutional: Positive for fatigue. Negative for fever and chills.  HENT: Positive for sore throat. Negative for dental problem and drooling.   Respiratory: Positive for shortness of breath and wheezing.   Cardiovascular: Negative for chest pain.  Gastrointestinal: Negative for nausea and vomiting.  Musculoskeletal: Positive for myalgias, arthralgias  and neck pain.  Neurological: Positive for headaches.  All other systems reviewed and are negative.   Allergies  Metoclopramide hcl; Penicillins; and Zofran  Home Medications   Prior to Admission medications   Medication Sig Start Date End Date Taking? Authorizing Provider  ALPRAZolam (XANAX) 1 MG tablet TK 1 T PO TID PRN FOR ANXIETY 05/01/15   Historical Provider, MD  amLODipine-benazepril (LOTREL) 10-20 MG per capsule Take 1 capsule by mouth every morning.     Historical  Provider, MD  dipyridamole-aspirin (AGGRENOX) 25-200 MG per 12 hr capsule Take 1 capsule by mouth 2 (two) times daily. Reported on 05/15/2015    Historical Provider, MD  furosemide (LASIX) 40 MG tablet Take 40 mg by mouth daily.  11/17/13   Historical Provider, MD  HYDROcodone-acetaminophen (NORCO) 10-325 MG per tablet Take 1 tablet by mouth every 4 (four) hours as needed.  12/08/13   Historical Provider, MD  hyoscyamine (LEVSIN SL) 0.125 MG SL tablet DISSOLVE 1 TABLET UNDER THE TONGUE EVERY 4 HOURS AS NEEDED FOR CRAMPING 07/18/15   Mahala Menghini, PA-C  KLOR-CON M10 10 MEQ tablet Take 10 mEq by mouth once.  12/02/13   Historical Provider, MD  levothyroxine (SYNTHROID, LEVOTHROID) 75 MCG tablet TK 1 T PO QD 04/23/15   Historical Provider, MD  Multiple Vitamin (MULTIVITAMIN) capsule Take 1 capsule by mouth daily.    Historical Provider, MD  omeprazole (PRILOSEC) 20 MG capsule TAKE 1 CAPSULE BY MOUTH EVERY DAY 09/11/15   Orvil Feil, NP  Probiotic Product (PROBIOTIC FORMULA PO) Take 1 capsule by mouth daily.     Historical Provider, MD  promethazine (PHENERGAN) 25 MG tablet Take 25 mg by mouth every 6 (six) hours as needed.  12/08/13   Historical Provider, MD  rOPINIRole (REQUIP) 1 MG tablet Take 1 mg by mouth at bedtime.      Historical Provider, MD   BP 151/75 mmHg  Pulse 88  Temp(Src) 98 F (36.7 C)  Resp 20  Ht 5' 3.5" (1.613 m)  Wt 180 lb (81.647 kg)  BMI 31.38 kg/m2  SpO2 96% Physical Exam CONSTITUTIONAL: Well developed/well nourished HEAD: Normocephalic/atraumatic EYES: EOMI/PERRL ENMT: Mucous membranes moist, tenderness to left submandibular lymph node, no trismus, no drooling, no intraoral abscess, no stridor noted Uvula midline without erythema/edema/exudates NECK: supple no meningeal signs SPINE/BACK:entire spine nontender CV: S1/S2 noted, no murmurs/rubs/gallops noted LUNGS: Lungs are clear to auscultation bilaterally, no apparent distress ABDOMEN: soft, nontender, no rebound or  guarding, bowel sounds noted throughout abdomen GU:no cva tenderness NEURO: Pt is awake/alert/appropriate, moves all extremitiesx4.  No facial droop.   EXTREMITIES: pulses normal/equal, full ROM SKIN: warm, color normal PSYCH: no abnormalities of mood noted, alert and oriented to situation  ED Course  Procedures  DIAGNOSTIC STUDIES: Oxygen Saturation is 96% on RA, normal by my interpretation.    COORDINATION OF CARE: 9:19 AM Discussed treatment plan which includes BMP, CBC with differential/platelet, and DG neck soft tissue with pt at bedside and pt agreed to plan. 10:51 AM Pt well appearing She has tender to lymphadenopathy - will start on clindamycin and will see PCP next week Pt otherwise nontoxic For her fatigue, initial labs unrevealing Will d/c home   Labs Review Labs Reviewed  BASIC METABOLIC PANEL - Abnormal; Notable for the following:    Glucose, Bld 103 (*)    All other components within normal limits  CBC WITH DIFFERENTIAL/PLATELET    Imaging Review Dg Neck Soft Tissue  09/12/2015  CLINICAL DATA:  70 year old female with left and anterior neck pain for 1 week with no known injury. Initial encounter. EXAM: NECK SOFT TISSUES - 1+ VIEW COMPARISON:  CT head without contrast 05/17/2008. FINDINGS: Normal prevertebral soft tissue contour. Mild straightening of cervical lordosis. Widespread degenerative endplate spurring despite relatively preserved disc spaces in the cervical spine. There is mild disc space loss at C6-C7. Cervicothoracic junction alignment is within normal limits. AP alignment is within normal limits. There is moderate bilateral cervical facet hypertrophy, perhaps most pronounced at C5 bilaterally. Negative lung apices. Evidence of superimposed calcified carotid artery atherosclerosis on the right. IMPRESSION: No acute osseous abnormality identified in the cervical spine, with mild to moderate for age endplate and facet degeneration. Electronically Signed   By: Genevie Ann M.D.   On: 09/12/2015 09:55   I have personally reviewed and evaluated these images and lab results as part of my medical decision-making.    MDM   Final diagnoses:  None   Nursing notes including past medical history and social history reviewed and considered in documentation xrays/imaging reviewed by myself and considered during evaluation Labs/vital reviewed myself and considered during evaluation   I personally performed the services described in this documentation, which was scribed in my presence. The recorded information has been reviewed and is accurate.      Ripley Fraise, MD 09/12/15 1052

## 2015-09-20 ENCOUNTER — Ambulatory Visit (HOSPITAL_COMMUNITY)
Admission: RE | Admit: 2015-09-20 | Discharge: 2015-09-20 | Disposition: A | Discharge status: Home / Self Care | Payer: Medicare HMO | Source: Ambulatory Visit | Attending: Pulmonary Disease | Admitting: Pulmonary Disease

## 2015-09-20 ENCOUNTER — Other Ambulatory Visit (HOSPITAL_COMMUNITY): Payer: Self-pay | Admitting: Pulmonary Disease

## 2015-09-20 DIAGNOSIS — M1612 Unilateral primary osteoarthritis, left hip: Secondary | ICD-10-CM | POA: Diagnosis not present

## 2015-09-20 DIAGNOSIS — M25552 Pain in left hip: Secondary | ICD-10-CM

## 2015-09-20 DIAGNOSIS — K1121 Acute sialoadenitis: Secondary | ICD-10-CM | POA: Diagnosis not present

## 2015-09-26 ENCOUNTER — Encounter: Payer: Self-pay | Admitting: Internal Medicine

## 2015-09-29 DIAGNOSIS — M25552 Pain in left hip: Secondary | ICD-10-CM | POA: Diagnosis not present

## 2015-10-02 DIAGNOSIS — K118 Other diseases of salivary glands: Secondary | ICD-10-CM | POA: Diagnosis not present

## 2015-10-09 DIAGNOSIS — M25552 Pain in left hip: Secondary | ICD-10-CM | POA: Diagnosis not present

## 2015-10-10 DIAGNOSIS — H40013 Open angle with borderline findings, low risk, bilateral: Secondary | ICD-10-CM | POA: Diagnosis not present

## 2015-10-10 DIAGNOSIS — Z961 Presence of intraocular lens: Secondary | ICD-10-CM | POA: Diagnosis not present

## 2015-10-10 DIAGNOSIS — Z9849 Cataract extraction status, unspecified eye: Secondary | ICD-10-CM | POA: Diagnosis not present

## 2015-10-15 DIAGNOSIS — M25552 Pain in left hip: Secondary | ICD-10-CM | POA: Diagnosis not present

## 2015-10-16 DIAGNOSIS — K118 Other diseases of salivary glands: Secondary | ICD-10-CM | POA: Diagnosis not present

## 2015-10-16 DIAGNOSIS — R221 Localized swelling, mass and lump, neck: Secondary | ICD-10-CM | POA: Diagnosis not present

## 2015-10-25 DIAGNOSIS — K115 Sialolithiasis: Secondary | ICD-10-CM | POA: Diagnosis not present

## 2015-10-25 DIAGNOSIS — K118 Other diseases of salivary glands: Secondary | ICD-10-CM | POA: Diagnosis not present

## 2015-10-29 DIAGNOSIS — M25552 Pain in left hip: Secondary | ICD-10-CM | POA: Diagnosis not present

## 2015-11-06 ENCOUNTER — Encounter: Payer: Self-pay | Admitting: Internal Medicine

## 2015-11-06 ENCOUNTER — Ambulatory Visit (INDEPENDENT_AMBULATORY_CARE_PROVIDER_SITE_OTHER): Payer: Medicare HMO | Admitting: Internal Medicine

## 2015-11-06 VITALS — BP 146/76 | HR 97 | Temp 98.4°F | Ht 63.5 in | Wt 187.0 lb

## 2015-11-06 DIAGNOSIS — Z86010 Personal history of colonic polyps: Secondary | ICD-10-CM | POA: Diagnosis not present

## 2015-11-06 DIAGNOSIS — K219 Gastro-esophageal reflux disease without esophagitis: Secondary | ICD-10-CM

## 2015-11-06 DIAGNOSIS — K3184 Gastroparesis: Secondary | ICD-10-CM | POA: Diagnosis not present

## 2015-11-06 NOTE — Progress Notes (Signed)
Primary Care Physician:  Alonza Bogus, MD Primary Gastroenterologist:  Dr. Gala Romney  Pre-Procedure History & Physical: HPI:  Tracey Mccullough is a 70 y.o. female here for follow-up of GERD, gastroparesis and irritable bowel syndrome. Bowel symptoms at baseline;  no longer taking Levsin. One formed bowel movement daily. Trying to get more exercise. Has significant osteoarthritis. No dysphagia. History of colonic adenoma; due for surveillance colonoscopy in 2018. Weight stable.  Past Medical History  Diagnosis Date  . Hypertension   . Arthritis   . COPD (chronic obstructive pulmonary disease) ([place])   . Hypercholesterolemia   . Restless leg syndrome   . Stroke ([place])   . Acid reflux   . Irritable bowel syndrome (IBS)   . Gastroparesis     INTOLERANT TO REGLAN; On marinol  . Depression   . GERD (gastroesophageal reflux disease)   . Schatzki's ring     EGD 1/12  . Hypokalemia 11/01/10    requiring admission  . Thyroid disease   . Hemorrhoids 09/01/2011    external    Past Surgical History  Procedure Laterality Date  . Cholecystectomy  1978  . Tubal ligation  1972  . Carpal tunnel release      bilateral  . Fracture surgery  2011    right arm  . Esophagogastroduodenoscopy  04/2010    Schatzki ring s/p dilation, minimal retained food. Versed 7/Demeral 150/Phenergan12.5.  . Kidney stone surgery  2008  . Colonoscopy  09/2006    hemorrhoids  . Colonoscopy  09/01/2011    Dr. Camillia Herter hemorrhoids, tubular adenoma. next tcs 08/2016  . Esophagogastroduodenoscopy  09/01/2011    Dr. Leodis Binet esophageal web and schatzki's ring, small hiatal hernia, antral erosions-bx-mild chronic inflammation. circumferential distal esophageal erosions    Prior to Admission medications   Medication Sig Start Date End Date Taking? Authorizing Provider  ALPRAZolam (XANAX) 1 MG tablet TK 1 T PO TID PRN FOR ANXIETY 05/01/15  Yes Historical Provider, MD  amLODipine-benazepril (LOTREL) 10-20 MG  per capsule Take 1 capsule by mouth every morning.    Yes Historical Provider, MD  dipyridamole-aspirin (AGGRENOX) 25-200 MG per 12 hr capsule Take 1 capsule by mouth 2 (two) times daily. Reported on 05/15/2015   Yes Historical Provider, MD  furosemide (LASIX) 40 MG tablet Take 40 mg by mouth daily.  11/17/13  Yes Historical Provider, MD  HYDROcodone-acetaminophen (NORCO) 10-325 MG per tablet Take 1 tablet by mouth every 4 (four) hours as needed.  12/08/13  Yes Historical Provider, MD  KLOR-CON M10 10 MEQ tablet Take 10 mEq by mouth daily.  12/02/13  Yes Historical Provider, MD  levothyroxine (SYNTHROID, LEVOTHROID) 75 MCG tablet Take 75 mcg by mouth daily before breakfast.   Yes Historical Provider, MD  Multiple Vitamin (MULTIVITAMIN) capsule Take 1 capsule by mouth daily.   Yes Historical Provider, MD  Probiotic Product (PROBIOTIC FORMULA PO) Take 1 capsule by mouth daily.    Yes Historical Provider, MD  promethazine (PHENERGAN) 25 MG tablet Take 25 mg by mouth every 6 (six) hours as needed for nausea.  12/08/13  Yes Historical Provider, MD  rOPINIRole (REQUIP) 1 MG tablet Take 1 mg by mouth at bedtime.     Yes Historical Provider, MD  clindamycin (CLEOCIN) 300 MG capsule Take 1 capsule (300 mg total) by mouth 4 (four) times daily. X 7 days 09/12/15   Ripley Fraise, MD    Allergies as of 11/06/2015 - Review Complete 11/06/2015  Allergen Reaction Noted  . Metoclopramide hcl  Other (See Comments)   . Penicillins Anaphylaxis   . Zofran Other (See Comments) 11/26/2010    Family History  Problem Relation Age of Onset  . Cancer Father     ?leukemia  . Cancer Sister   . Colon cancer Brother 75  . Colon cancer Sister 22    recently diagnosed    Social History   Social History  . Marital Status: Married    Spouse Name: N/A  . Number of Children: 3  . Years of Education: N/A   Occupational History  .     Social History Main Topics  . Smoking status: Current Every Day Smoker -- 2.00  packs/day    Types: Cigarettes  . Smokeless tobacco: Not on file     Comment: one pack daily  . Alcohol Use: No  . Drug Use: No  . Sexual Activity: Not Currently   Other Topics Concern  . Not on file   Social History Narrative    Review of Systems: See HPI, otherwise negative ROS  Physical Exam: BP 146/76 mmHg  Pulse 97  Temp(Src) 98.4 F (36.9 C) (Oral)  Ht 5' 3.5" (1.613 m)  Wt 187 lb (84.823 kg)  BMI 32.60 kg/m2 General:   Alert,  Well-developed, well-nourished, pleasant and cooperative in NAD Skin:  Intact without significant lesions or rashes. Eyes:  Sclera clear, no icterus.   Conjunctiva pink. Neck:  Supple; no masses or thyromegaly. No significant cervical adenopathy. Lungs:  Clear throughout to auscultation.   No wheezes, crackles, or rhonchi. No acute distress. Heart:  Regular rate and rhythm; no murmurs, clicks, rubs,  or gallops. Abdomen: Non-distended, normal bowel sounds.  Soft and nontender without appreciable mass or hepatosplenomegaly.  Pulses:  Normal pulses noted. Extremities:  Without clubbing or edema.  Impression:  Pleasant 70 year old lady with gastroparesis/GERD-symptoms quiescent  - on Nexium 20 mg daily and a low-fat diet .  No bowel symptoms these days.Marland Kitchen History of colonic adenoma; due for surveillance colonoscopy in one year.  Recommendations:    Continue Nexium 20 mg daily;  Continue on gastroparesis diet  Consider water aerobics for exercise to minimize trauma to weightbearing joints.  GERD information  Office visit in 1 year to set up a surveillance colonoscopy  Recommends per my chart.   Notice: This dictation was prepared with Dragon dictation along with smaller phrase technology. Any transcriptional errors that result from this process are unintentional and may not be corrected upon review.

## 2015-11-06 NOTE — Patient Instructions (Signed)
Continue Nexium 20 mg daily  Consider water aerobics  GERD information  Office visit in 1 year to set up a surveillance colonoscopy

## 2015-12-18 ENCOUNTER — Other Ambulatory Visit (HOSPITAL_COMMUNITY): Payer: Self-pay | Admitting: Pulmonary Disease

## 2015-12-18 DIAGNOSIS — Z780 Asymptomatic menopausal state: Secondary | ICD-10-CM

## 2015-12-18 DIAGNOSIS — Z0000 Encounter for general adult medical examination without abnormal findings: Secondary | ICD-10-CM | POA: Diagnosis not present

## 2015-12-18 DIAGNOSIS — Z23 Encounter for immunization: Secondary | ICD-10-CM | POA: Diagnosis not present

## 2015-12-19 ENCOUNTER — Other Ambulatory Visit: Payer: Self-pay | Admitting: Acute Care

## 2015-12-19 DIAGNOSIS — F17210 Nicotine dependence, cigarettes, uncomplicated: Secondary | ICD-10-CM

## 2015-12-25 ENCOUNTER — Ambulatory Visit (HOSPITAL_COMMUNITY)
Admission: RE | Admit: 2015-12-25 | Discharge: 2015-12-25 | Disposition: A | Discharge status: Home / Self Care | Payer: Medicare HMO | Source: Ambulatory Visit | Attending: Pulmonary Disease | Admitting: Pulmonary Disease

## 2015-12-25 DIAGNOSIS — J449 Chronic obstructive pulmonary disease, unspecified: Secondary | ICD-10-CM | POA: Diagnosis not present

## 2015-12-25 DIAGNOSIS — K210 Gastro-esophageal reflux disease with esophagitis: Secondary | ICD-10-CM | POA: Diagnosis not present

## 2015-12-25 DIAGNOSIS — E039 Hypothyroidism, unspecified: Secondary | ICD-10-CM | POA: Diagnosis not present

## 2015-12-25 DIAGNOSIS — M8589 Other specified disorders of bone density and structure, multiple sites: Secondary | ICD-10-CM | POA: Insufficient documentation

## 2015-12-25 DIAGNOSIS — M129 Arthropathy, unspecified: Secondary | ICD-10-CM | POA: Diagnosis not present

## 2015-12-25 DIAGNOSIS — I6350 Cerebral infarction due to unspecified occlusion or stenosis of unspecified cerebral artery: Secondary | ICD-10-CM | POA: Diagnosis not present

## 2015-12-25 DIAGNOSIS — Z780 Asymptomatic menopausal state: Secondary | ICD-10-CM

## 2015-12-25 DIAGNOSIS — Z0000 Encounter for general adult medical examination without abnormal findings: Secondary | ICD-10-CM | POA: Diagnosis not present

## 2015-12-25 DIAGNOSIS — I10 Essential (primary) hypertension: Secondary | ICD-10-CM | POA: Diagnosis not present

## 2015-12-25 DIAGNOSIS — M85861 Other specified disorders of bone density and structure, right lower leg: Secondary | ICD-10-CM | POA: Diagnosis not present

## 2015-12-25 DIAGNOSIS — K3184 Gastroparesis: Secondary | ICD-10-CM | POA: Diagnosis not present

## 2015-12-25 DIAGNOSIS — K589 Irritable bowel syndrome without diarrhea: Secondary | ICD-10-CM | POA: Diagnosis not present

## 2015-12-25 DIAGNOSIS — E785 Hyperlipidemia, unspecified: Secondary | ICD-10-CM | POA: Diagnosis not present

## 2015-12-31 DIAGNOSIS — Z1211 Encounter for screening for malignant neoplasm of colon: Secondary | ICD-10-CM | POA: Diagnosis not present

## 2016-01-21 ENCOUNTER — Encounter: Payer: Medicare HMO | Admitting: Acute Care

## 2016-01-21 ENCOUNTER — Ambulatory Visit: Payer: Medicare HMO

## 2016-01-28 ENCOUNTER — Telehealth: Payer: Self-pay | Admitting: Acute Care

## 2016-01-30 NOTE — Telephone Encounter (Signed)
Will forward to amanda to reschedule the lung cancer screening appt.

## 2016-01-30 NOTE — Telephone Encounter (Signed)
LMTCB x 1 

## 2016-01-31 NOTE — Telephone Encounter (Signed)
Pt return call to resched appoint.Hillery Hunter

## 2016-01-31 NOTE — Telephone Encounter (Signed)
Forwarding to lung cancer screening pool.

## 2016-02-01 NOTE — Telephone Encounter (Signed)
Patient returned phone call to resched appt.Tracey Mccullough

## 2016-02-07 NOTE — Telephone Encounter (Signed)
General 02/07/2016 9:23 AM Katherine Roan Royce Sciara, CMA - -  Note   lmtcb x3. Letter sent to Dr. Luan Pulling to make him aware that we canceled referral.          Nothing further needed at this time.

## 2016-02-18 ENCOUNTER — Ambulatory Visit (INDEPENDENT_AMBULATORY_CARE_PROVIDER_SITE_OTHER)
Admission: RE | Admit: 2016-02-18 | Discharge: 2016-02-18 | Disposition: A | Discharge status: Home / Self Care | Payer: Medicare HMO | Source: Ambulatory Visit | Attending: Acute Care | Admitting: Acute Care

## 2016-02-18 ENCOUNTER — Encounter: Payer: Self-pay | Admitting: Acute Care

## 2016-02-18 ENCOUNTER — Ambulatory Visit (INDEPENDENT_AMBULATORY_CARE_PROVIDER_SITE_OTHER): Payer: Medicare HMO | Admitting: Acute Care

## 2016-02-18 DIAGNOSIS — F17210 Nicotine dependence, cigarettes, uncomplicated: Secondary | ICD-10-CM | POA: Diagnosis not present

## 2016-02-18 DIAGNOSIS — Z87891 Personal history of nicotine dependence: Secondary | ICD-10-CM

## 2016-02-18 NOTE — Progress Notes (Signed)
Shared Decision Making Visit Lung Cancer Screening Program [phone])   Eligibility:  Age 70 y.o.  Pack Years Smoking History Calculation 92.75 pack year smoking history (# packs/per year x # years smoked)  Recent History of coughing up blood  no  Unexplained weight loss? no ( >Than 15 pounds within the last 6 months )  Prior History Lung / other cancer no (Diagnosis within the last 5 years already requiring surveillance chest CT Scans).  Smoking Status Current Smoker  Former Smokers: Years since quit: NA  Quit Date: NA  Visit Components:  Discussion included one or more decision making aids. yes  Discussion included risk/benefits of screening. yes  Discussion included potential follow up diagnostic testing for abnormal scans. yes  Discussion included meaning and risk of over diagnosis. yes  Discussion included meaning and risk of False Positives. yes  Discussion included meaning of total radiation exposure. yes  Counseling Included:  Importance of adherence to annual lung cancer LDCT screening. yes  Impact of comorbidities on ability to participate in the program. yes  Ability and willingness to under diagnostic treatment. yes  Smoking Cessation Counseling:  Current Smokers:   Discussed importance of smoking cessation. yes  Information about tobacco cessation classes and interventions provided to patient. yes  Patient provided with "ticket" for LDCT Scan. yes  Symptomatic Patient. no  Counseling  Diagnosis Code: Tobacco Use Z72.0  Asymptomatic Patient yes  Counseling (Intermediate counseling: > three minutes counseling) [id number]  Former Smokers:   Discussed the importance of maintaining cigarette abstinence. yes  Diagnosis Code: Personal History of Nicotine Dependence. [id number]  Information about tobacco cessation classes and interventions provided to patient. Yes  Patient provided with "ticket" for LDCT Scan. yes  Written Order for Lung Cancer  Screening with LDCT placed in Epic. Yes (CT Chest Lung Cancer Screening Low Dose W/O CM) [id number] Z12.2-Screening of respiratory organs Z87.891-Personal history of nicotine dependence  I have spent 20 minutes of face to face time with Tracey Mccullough  discussing the risks and benefits of lung cancer screening. We viewed a power point together that explained in detail the above noted topics. We paused at intervals to allow for questions to be asked and answered to ensure understanding.We discussed that the single most powerful action that she can take to decrease her risk of developing lung cancer is to quit smoking. We discussed whether or not she is ready to commit to setting a quit date. She is currently not ready to set a quit date. We discussed options for tools to aid in quitting smoking including nicotine replacement therapy, non-nicotine medications, support groups, Quit Smart classes, and behavior modification. We discussed that often times setting smaller, more achievable goals, such as eliminating 1 cigarette a day for a week and then 2 cigarettes a day for a week can be helpful in slowly decreasing the number of cigarettes smoked. This allows for a sense of accomplishment as well as providing a clinical benefit. I gave her  the " Be Stronger Than Your Excuses" card with contact information for community resources, classes, free nicotine replacement therapy, and access to mobile apps, text messaging, and on-line smoking cessation help. I have also given her my card and contact information in the event she needs to contact me. We discussed the time and location of the scan, and that either June Leap, CMA, or I will call with the results within 24-48 hours of receiving them. I have provided her with a copy of the power  point we viewed  as a resource in the event they need reinforcement of the concepts we discussed today in the office. The patient verbalized understanding of all of  the above and  had no further questions upon leaving the office. They have my contact information in the event they have any further questions.   Tracey Spatz, NP 02/18/2016

## 2016-02-20 ENCOUNTER — Telehealth: Payer: Self-pay | Admitting: Acute Care

## 2016-02-20 NOTE — Telephone Encounter (Signed)
I have called the results of [name]'s low-dose CT screening. I explained to her that her scan was read as a Lung RADS 1, negative study: no nodules or definitely benign nodules. Radiology recommendation is for a repeat LDCT in 12 months. I told her that we will order and schedule her scan for October 2018. I also informed her that there was an incidental finding of aortic atherosclerosis and coronary artery calcification in addition to emphysema. We discussed whether or not she took a statin, and she stated she has an appointment with Dr. Luan Pulling later this month and she will discuss it with him then. She stated she was aware that she had emphysema. She verbalized understanding of the above and had no further questions at completion of the call. She has my contact information in the event she has questions in the future.

## 2016-03-17 ENCOUNTER — Other Ambulatory Visit: Payer: Self-pay | Admitting: Gastroenterology

## 2016-03-19 DIAGNOSIS — J449 Chronic obstructive pulmonary disease, unspecified: Secondary | ICD-10-CM | POA: Diagnosis not present

## 2016-03-19 DIAGNOSIS — R609 Edema, unspecified: Secondary | ICD-10-CM | POA: Diagnosis not present

## 2016-03-19 DIAGNOSIS — R635 Abnormal weight gain: Secondary | ICD-10-CM | POA: Diagnosis not present

## 2016-03-19 DIAGNOSIS — I10 Essential (primary) hypertension: Secondary | ICD-10-CM | POA: Diagnosis not present

## 2016-04-03 ENCOUNTER — Observation Stay (HOSPITAL_COMMUNITY)
Admission: EM | Admit: 2016-04-03 | Discharge: 2016-04-03 | Disposition: A | Discharge status: Home / Self Care | Payer: Medicare HMO | Attending: Pulmonary Disease | Admitting: Pulmonary Disease

## 2016-04-03 ENCOUNTER — Encounter (HOSPITAL_COMMUNITY): Payer: Self-pay | Admitting: Emergency Medicine

## 2016-04-03 ENCOUNTER — Emergency Department (HOSPITAL_COMMUNITY): Payer: Medicare HMO

## 2016-04-03 DIAGNOSIS — J449 Chronic obstructive pulmonary disease, unspecified: Secondary | ICD-10-CM | POA: Diagnosis not present

## 2016-04-03 DIAGNOSIS — F17210 Nicotine dependence, cigarettes, uncomplicated: Secondary | ICD-10-CM | POA: Insufficient documentation

## 2016-04-03 DIAGNOSIS — Z8673 Personal history of transient ischemic attack (TIA), and cerebral infarction without residual deficits: Secondary | ICD-10-CM | POA: Insufficient documentation

## 2016-04-03 DIAGNOSIS — I10 Essential (primary) hypertension: Secondary | ICD-10-CM | POA: Diagnosis not present

## 2016-04-03 DIAGNOSIS — K3184 Gastroparesis: Secondary | ICD-10-CM | POA: Insufficient documentation

## 2016-04-03 DIAGNOSIS — Z7902 Long term (current) use of antithrombotics/antiplatelets: Secondary | ICD-10-CM | POA: Diagnosis not present

## 2016-04-03 DIAGNOSIS — E7800 Pure hypercholesterolemia, unspecified: Secondary | ICD-10-CM | POA: Insufficient documentation

## 2016-04-03 DIAGNOSIS — F329 Major depressive disorder, single episode, unspecified: Secondary | ICD-10-CM | POA: Insufficient documentation

## 2016-04-03 DIAGNOSIS — R69 Illness, unspecified: Secondary | ICD-10-CM | POA: Diagnosis not present

## 2016-04-03 DIAGNOSIS — E079 Disorder of thyroid, unspecified: Secondary | ICD-10-CM | POA: Diagnosis not present

## 2016-04-03 DIAGNOSIS — R0789 Other chest pain: Secondary | ICD-10-CM | POA: Diagnosis not present

## 2016-04-03 DIAGNOSIS — K219 Gastro-esophageal reflux disease without esophagitis: Secondary | ICD-10-CM | POA: Insufficient documentation

## 2016-04-03 DIAGNOSIS — Z79899 Other long term (current) drug therapy: Secondary | ICD-10-CM | POA: Diagnosis not present

## 2016-04-03 DIAGNOSIS — R531 Weakness: Secondary | ICD-10-CM | POA: Insufficient documentation

## 2016-04-03 DIAGNOSIS — G2581 Restless legs syndrome: Secondary | ICD-10-CM | POA: Insufficient documentation

## 2016-04-03 DIAGNOSIS — R079 Chest pain, unspecified: Secondary | ICD-10-CM | POA: Diagnosis not present

## 2016-04-03 LAB — CBC
HCT: 44.1 % (ref 36.0–46.0)
Hemoglobin: 15.4 g/dL — ABNORMAL HIGH (ref 12.0–15.0)
MCH: 30.4 pg (ref 26.0–34.0)
MCHC: 34.9 g/dL (ref 30.0–36.0)
MCV: 87 fL (ref 78.0–100.0)
PLATELETS: 218 10*3/uL (ref 150–400)
RBC: 5.07 MIL/uL (ref 3.87–5.11)
RDW: 13.5 % (ref 11.5–15.5)
WBC: 9.1 10*3/uL (ref 4.0–10.5)

## 2016-04-03 LAB — HEPATIC FUNCTION PANEL
ALT: 50 U/L (ref 14–54)
AST: 59 U/L — ABNORMAL HIGH (ref 15–41)
Albumin: 4.9 g/dL (ref 3.5–5.0)
Alkaline Phosphatase: 98 U/L (ref 38–126)
BILIRUBIN DIRECT: 0.1 mg/dL (ref 0.1–0.5)
BILIRUBIN INDIRECT: 0.2 mg/dL — AB (ref 0.3–0.9)
BILIRUBIN TOTAL: 0.3 mg/dL (ref 0.3–1.2)
Total Protein: 8.1 g/dL (ref 6.5–8.1)

## 2016-04-03 LAB — TROPONIN I: Troponin I: <0.03 ng/mL (ref <0.03)

## 2016-04-03 LAB — URINALYSIS, ROUTINE W REFLEX MICROSCOPIC
Bilirubin Urine: NEGATIVE
GLUCOSE, UA: NEGATIVE mg/dL
KETONES UR: NEGATIVE mg/dL
Leukocytes, UA: NEGATIVE
Nitrite: NEGATIVE
PH: 6.5 (ref 5.0–8.0)
PROTEIN: NEGATIVE mg/dL
Specific Gravity, Urine: <1.005 — ABNORMAL LOW (ref 1.005–1.030)

## 2016-04-03 LAB — BASIC METABOLIC PANEL
ANION GAP: 11 (ref 5–15)
BUN: 12 mg/dL (ref 6–20)
CALCIUM: 9.6 mg/dL (ref 8.9–10.3)
CO2: 23 mmol/L (ref 22–32)
CREATININE: 0.7 mg/dL (ref 0.44–1.00)
Chloride: 103 mmol/L (ref 101–111)
GFR calc Af Amer: >60 mL/min (ref >60)
GLUCOSE: 143 mg/dL — AB (ref 65–99)
Potassium: 3.9 mmol/L (ref 3.5–5.1)
Sodium: 137 mmol/L (ref 135–145)

## 2016-04-03 LAB — URINALYSIS, MICROSCOPIC (REFLEX): WBC UA: NONE SEEN WBC/hpf (ref 0–5)

## 2016-04-03 LAB — LIPASE, BLOOD: Lipase: 43 U/L (ref 11–51)

## 2016-04-03 LAB — BRAIN NATRIURETIC PEPTIDE: B NATRIURETIC PEPTIDE 5: 37 pg/mL (ref 0.0–100.0)

## 2016-04-03 MED ORDER — PROCHLORPERAZINE EDISYLATE 5 MG/ML IJ SOLN
10.0000 mg | Freq: Once | INTRAMUSCULAR | Status: AC
  Administered 2016-04-03: 10 mg via INTRAVENOUS
  Filled 2016-04-03: qty 2

## 2016-04-03 MED ORDER — SODIUM CHLORIDE 0.9 % IV BOLUS (SEPSIS)
1000.0000 mL | Freq: Once | INTRAVENOUS | Status: AC
  Administered 2016-04-03: 1000 mL via INTRAVENOUS

## 2016-04-03 MED ORDER — PROMETHAZINE HCL 12.5 MG PO TABS
25.0000 mg | ORAL_TABLET | Freq: Once | ORAL | Status: AC
  Administered 2016-04-03: 25 mg via ORAL
  Filled 2016-04-03: qty 2

## 2016-04-03 MED ORDER — ALPRAZOLAM 0.5 MG PO TABS
1.0000 mg | ORAL_TABLET | Freq: Once | ORAL | Status: AC
  Administered 2016-04-03: 1 mg via ORAL
  Filled 2016-04-03: qty 2

## 2016-04-03 MED ORDER — NICOTINE 21 MG/24HR TD PT24
21.0000 mg | MEDICATED_PATCH | Freq: Once | TRANSDERMAL | Status: DC
  Administered 2016-04-03: 21 mg via TRANSDERMAL
  Filled 2016-04-03: qty 1

## 2016-04-03 NOTE — ED Notes (Addendum)
Dr. Luan Pulling by to eval pt. He stated it was ok for pt to go home. EDP aware.

## 2016-04-03 NOTE — ED Notes (Signed)
Pt stated she wanted to go home. Pt states she is anxious. Interventions provided. Pt still persistent about leaving.

## 2016-04-03 NOTE — ED Provider Notes (Signed)
[facility] DEPT Provider Note   CSN: [id number] Arrival date & time: 04/03/16  0254     History   Chief Complaint Chief Complaint  Patient presents with  . Chest Pain    HPI Tracey Mccullough is a 70 y.o. female.  Patient presents with episode of central chest "tightness" that is now resolved. States she's had intermittent episodes of central chest tightness lasting several minutes at a time to come on at rest but are worse with exertion. Episodes last about 15 minutes at a time. They're associated with nausea and some shortness of breath. Patient does have history of COPD and is currently still smoking. She does not wear oxygen at home. She also has gastroparesis and acid reflux. States she has chronic nausea due to her gastroparesis and the chest tightness did not feel like her reflux. It was associated with some diaphoresis and nausea but no vomiting. The pain went away on its own. She denies having any previous heart attacks but has had a stroke. Last stress test was in 2008. She denies any chest pain at this time. She also reports she's had generalized weakness this progressively worsening for several months but became worse over the past 3 or 4 days.   The history is provided by the patient and a relative.  Chest Pain   Associated symptoms include nausea, shortness of breath and weakness. Pertinent negatives include no abdominal pain, no dizziness, no fever, no numbness and no vomiting.    Past Medical History:  Diagnosis Date  . Acid reflux   . Arthritis   . COPD (chronic obstructive pulmonary disease) ([place])   . Depression   . Gastroparesis    INTOLERANT TO REGLAN; On marinol  . GERD (gastroesophageal reflux disease)   . Hemorrhoids 09/01/2011   external  . Hypercholesterolemia   . Hypertension   . Hypokalemia 11/01/10   requiring admission  . Irritable bowel syndrome (IBS)   . Restless leg syndrome   . Schatzki's ring    EGD 1/12  . Stroke ([place])   . Thyroid  disease     Patient Active Problem List   Diagnosis Date Noted  . Abnormal LFTs 08/24/2012  . Leukocytosis, unspecified 08/24/2012  . Dysphagia 08/11/2011  . Headache(784.0) 04/03/2011  . Fatty liver 04/03/2011  . Family history of colon cancer 04/03/2011  . COPD (chronic obstructive pulmonary disease) ([place]) 11/01/2010  . CVA (cerebral vascular accident) ([place]) 11/01/2010  . Abdominal pain, epigastric 09/19/2009  . Constipation 08/04/2008  . HEMORRHOIDS, EXTERNAL 12/09/2007  . GERD, SEVERE 12/09/2007  . Gastroparesis 12/09/2007  . HIATAL HERNIA 12/09/2007  . Other fatigue 12/09/2007  . NAUSEA 12/09/2007  . ABDOMINAL BLOATING 12/09/2007    Past Surgical History:  Procedure Laterality Date  . CARPAL TUNNEL RELEASE     bilateral  . CHOLECYSTECTOMY  1978  . COLONOSCOPY  09/2006   hemorrhoids  . COLONOSCOPY  09/01/2011   Dr. Camillia Herter hemorrhoids, tubular adenoma. next tcs 08/2016  . ESOPHAGOGASTRODUODENOSCOPY  04/2010   Schatzki ring s/p dilation, minimal retained food. Versed 7/Demeral 150/Phenergan12.5.  . ESOPHAGOGASTRODUODENOSCOPY  09/01/2011   Dr. Leodis Binet esophageal web and schatzki's ring, small hiatal hernia, antral erosions-bx-mild chronic inflammation. circumferential distal esophageal erosions  . FRACTURE SURGERY  2011   right arm  . KIDNEY STONE SURGERY  2008  . TUBAL LIGATION  1972    OB History    No data available       Home Medications    Prior to Admission medications  Medication Sig Start Date End Date Taking? Authorizing Provider  ALPRAZolam (XANAX) 1 MG tablet TK 1 T PO TID PRN FOR ANXIETY 05/01/15  Yes Historical Provider, MD  amLODipine-benazepril (LOTREL) 10-20 MG per capsule Take 1 capsule by mouth every morning.    Yes Historical Provider, MD  clopidogrel (PLAVIX) 75 MG tablet Take 75 mg by mouth daily.   Yes Historical Provider, MD  HYDROcodone-acetaminophen (NORCO) 10-325 MG per tablet Take 1 tablet by mouth every 4 (four) hours as  needed.  12/08/13  Yes Historical Provider, MD  hyoscyamine (ANASPAZ) 0.125 MG TBDP disintergrating tablet Place 0.125 mg under the tongue.   Yes Historical Provider, MD  KLOR-CON M10 10 MEQ tablet Take 10 mEq by mouth daily.  12/02/13  Yes Historical Provider, MD  levothyroxine (SYNTHROID, LEVOTHROID) 75 MCG tablet Take 75 mcg by mouth daily before breakfast.   Yes Historical Provider, MD  Multiple Vitamin (MULTIVITAMIN) capsule Take 1 capsule by mouth daily.   Yes Historical Provider, MD  omeprazole (PRILOSEC) 20 MG capsule TAKE 1 CAPSULE BY MOUTH EVERY DAY 03/18/16  Yes Annitta Needs, NP  pravastatin (PRAVACHOL) 20 MG tablet Take 20 mg by mouth daily.   Yes Historical Provider, MD  Probiotic Product (PROBIOTIC FORMULA PO) Take 1 capsule by mouth daily.    Yes Historical Provider, MD  promethazine (PHENERGAN) 25 MG tablet Take 25 mg by mouth every 6 (six) hours as needed for nausea.  12/08/13  Yes Historical Provider, MD  rOPINIRole (REQUIP) 1 MG tablet Take 1 mg by mouth at bedtime.     Yes Historical Provider, MD  torsemide (DEMADEX) 20 MG tablet Take 20 mg by mouth daily.   Yes Historical Provider, MD  clindamycin (CLEOCIN) 300 MG capsule Take 1 capsule (300 mg total) by mouth 4 (four) times daily. X 7 days 09/12/15   Ripley Fraise, MD  dipyridamole-aspirin (AGGRENOX) 25-200 MG per 12 hr capsule Take 1 capsule by mouth 2 (two) times daily. Reported on 05/15/2015    Historical Provider, MD  furosemide (LASIX) 40 MG tablet Take 40 mg by mouth daily.  11/17/13   Historical Provider, MD    Family History Family History  Problem Relation Age of Onset  . Cancer Father     ?leukemia  . Cancer Sister   . Colon cancer Brother 32  . Colon cancer Sister 52    recently diagnosed    Social History Social History  Substance Use Topics  . Smoking status: Current Every Day Smoker    Packs/day: 1.75    Years: 53.00    Types: Cigarettes  . Smokeless tobacco: Never Used     Comment: one pack daily    . Alcohol use No     Allergies   Metoclopramide hcl; Penicillins; and Zofran   Review of Systems Review of Systems  Constitutional: Positive for activity change, appetite change and fatigue. Negative for fever.  HENT: Negative for congestion and rhinorrhea.   Respiratory: Positive for chest tightness and shortness of breath.   Cardiovascular: Positive for chest pain.  Gastrointestinal: Positive for nausea. Negative for abdominal pain and vomiting.  Genitourinary: Negative for hematuria, vaginal bleeding and vaginal discharge.  Musculoskeletal: Negative for arthralgias and myalgias.  Neurological: Positive for weakness. Negative for dizziness, light-headedness and numbness.   A complete 10 system review of systems was obtained and all systems are negative except as noted in the HPI and PMH.  \  Physical Exam Updated Vital Signs BP 184/91 (BP Location: Right Arm)  Pulse 87   Temp 97.7 F (36.5 C) (Oral)   Resp 17   Ht 5\' 3"  (1.6 m)   Wt 202 lb (91.6 kg)   SpO2 92%   BMI 35.78 kg/m   Physical Exam  Constitutional: She is oriented to person, place, and time. She appears well-developed and well-nourished. No distress.  Smells of tobacco  HENT:  Head: Normocephalic and atraumatic.  Mouth/Throat: Oropharynx is clear and moist. No oropharyngeal exudate.  Eyes: Conjunctivae and EOM are normal. Pupils are equal, round, and reactive to light.  Neck: Normal range of motion. Neck supple.  No meningismus.  Cardiovascular: Normal rate, regular rhythm, normal heart sounds and intact distal pulses.   No murmur heard. Pulmonary/Chest: Effort normal and breath sounds normal. No respiratory distress. She has no wheezes. She has no rales.  Abdominal: Soft. There is no tenderness. There is no rebound and no guarding.  Musculoskeletal: Normal range of motion. She exhibits no edema or tenderness.  Neurological: She is alert and oriented to person, place, and time. No cranial nerve  deficit. She exhibits normal muscle tone. Coordination normal.  No ataxia on finger to nose bilaterally. No pronator drift. 5/5 strength throughout. CN 2-12 intact.Equal grip strength. Sensation intact.   Skin: Skin is warm.  Psychiatric: She has a normal mood and affect. Her behavior is normal.  Nursing note and vitals reviewed.    ED Treatments / Results  Labs (all labs ordered are listed, but only abnormal results are displayed) Labs Reviewed  BASIC METABOLIC PANEL - Abnormal; Notable for the following:       Result Value   Glucose, Bld 143 (*)    All other components within normal limits  CBC - Abnormal; Notable for the following:    Hemoglobin 15.4 (*)    All other components within normal limits  URINALYSIS, ROUTINE W REFLEX MICROSCOPIC - Abnormal; Notable for the following:    Specific Gravity, Urine <1.005 (*)    Hgb urine dipstick TRACE (*)    All other components within normal limits  HEPATIC FUNCTION PANEL - Abnormal; Notable for the following:    AST 59 (*)    Indirect Bilirubin 0.2 (*)    All other components within normal limits  URINALYSIS, MICROSCOPIC (REFLEX) - Abnormal; Notable for the following:    Bacteria, UA RARE (*)    Squamous Epithelial / LPF TOO NUMEROUS TO COUNT (*)    All other components within normal limits  TROPONIN I  BRAIN NATRIURETIC PEPTIDE  LIPASE, BLOOD  TROPONIN I    EKG  EKG Interpretation  Date/Time:  Thursday April 03 2016 03:01:26 EST Ventricular Rate:  106 PR Interval:    QRS Duration: 94 QT Interval:  345 QTC Calculation: 459 R Axis:   78 Text Interpretation:  Sinus tachycardia Ventricular premature complex Low voltage, precordial leads Baseline wander in lead(s) V1 No significant change was found Confirmed by Wyvonnia Dusky  MD, Nashira Mcglynn [phone]) on 04/03/2016 3:23:31 AM       Radiology Dg Chest 2 View  Result Date: 04/03/2016 CLINICAL DATA:  70 y/o F; chest pain, weakness, and shortness of breath. EXAM: CHEST  2 VIEW  COMPARISON:  None. FINDINGS: The heart size and mediastinal contours are within normal limits and stable. Both lungs are clear. The visualized skeletal structures are unremarkable. Right upper quadrant cholecystectomy clips. IMPRESSION: No active cardiopulmonary disease. Electronically Signed   By: Kristine Garbe M.D.   On: 04/03/2016 04:58    Procedures Procedures (including critical care  time)  Medications Ordered in ED Medications - No data to display   Initial Impression / Assessment and Plan / ED Course  I have reviewed the triage vital signs and the nursing notes.  Pertinent labs & imaging results that were available during my care of the patient were reviewed by me and considered in my medical decision making (see chart for details).  Clinical Course   Patient with episode of chest tightness intermittently over the past several days, now resolved. EKG without acute ST changes.  Troponin negative.  CXR clear.  No wheezing on exam.   Patient c/o generalized weakness but otherwise feels improved. No further episodes of chest pain or tightness.  Heart score 4.   Plan admission for chest pain rule out. D/w Dr. Darrick Meigs.    Final Clinical Impressions(s) / ED Diagnoses   Final diagnoses:  Chest pain, unspecified type    New Prescriptions New Prescriptions   No medications on file     Ezequiel Essex, MD 04/03/16 [phone]

## 2016-04-17 ENCOUNTER — Encounter: Payer: Self-pay | Admitting: Cardiology

## 2016-05-30 ENCOUNTER — Ambulatory Visit: Payer: Medicare HMO | Admitting: Cardiovascular Disease

## 2016-06-09 DIAGNOSIS — I10 Essential (primary) hypertension: Secondary | ICD-10-CM | POA: Diagnosis not present

## 2016-06-09 DIAGNOSIS — R635 Abnormal weight gain: Secondary | ICD-10-CM | POA: Diagnosis not present

## 2016-06-09 DIAGNOSIS — M129 Arthropathy, unspecified: Secondary | ICD-10-CM | POA: Diagnosis not present

## 2016-06-09 DIAGNOSIS — J449 Chronic obstructive pulmonary disease, unspecified: Secondary | ICD-10-CM | POA: Diagnosis not present

## 2016-06-10 ENCOUNTER — Encounter: Payer: Self-pay | Admitting: Internal Medicine

## 2016-06-17 ENCOUNTER — Other Ambulatory Visit: Payer: Self-pay

## 2016-06-17 ENCOUNTER — Encounter: Payer: Self-pay | Admitting: Internal Medicine

## 2016-06-17 ENCOUNTER — Ambulatory Visit (INDEPENDENT_AMBULATORY_CARE_PROVIDER_SITE_OTHER): Payer: Medicare HMO | Admitting: Internal Medicine

## 2016-06-17 VITALS — BP 123/69 | HR 89 | Temp 97.6°F | Ht 63.5 in | Wt 204.8 lb

## 2016-06-17 DIAGNOSIS — R1319 Other dysphagia: Secondary | ICD-10-CM

## 2016-06-17 DIAGNOSIS — K219 Gastro-esophageal reflux disease without esophagitis: Secondary | ICD-10-CM

## 2016-06-17 DIAGNOSIS — Z86010 Personal history of colonic polyps: Secondary | ICD-10-CM | POA: Diagnosis not present

## 2016-06-17 DIAGNOSIS — R1310 Dysphagia, unspecified: Secondary | ICD-10-CM

## 2016-06-17 MED ORDER — PEG 3350-KCL-NA BICARB-NACL 420 G PO SOLR: 4000.0000 mL | ORAL | 0 refills | Status: DC

## 2016-06-17 NOTE — Patient Instructions (Signed)
Schedule a surveillance colonoscopy (hx of polyps) and EGD with ED - hx of GERD and Dysphagia--Propofol - needs an extra half day of clear liquids  GERD information  Further recommendations to follow  Regular exercise as previously recommended

## 2016-06-17 NOTE — Progress Notes (Signed)
  Primary Care Physician:  HAWKINS,EDWARD L, MD Primary Gastroenterologist:  Dr. Jousha Schwandt  Pre-Procedure History & Physical: HPI:  Tracey Mccullough is a 71 y.o. female here for follow-up of GERD and history of colonic polyps. Has had more abdominal bloating lately. Has gained 18 pounds since she was last seen last year. Has not started an exercise program. Does not go to the [employer], etc. Seen by Dr. Hawkins recently; he changed her fluid medication around. States less ankle edema. Has bloating discomfort; frequent reflux symptoms in spite of omeprazole 20 mg daily. In fact, has recurrent esophageal dysphagia. No change in bowel habits. History of multiple adenomas removed 5 years ago; sister with colon cancer. Due for surveillance colonoscopy at this time. Significant breakthrough reflux symptoms at least 3 times weekly. Since last seen,  switched from Aggrenox to Plavix. No anticoagulation. Large amount of conscious sedation medication required at her last endoscopy session. Patient reports sedation was suboptimal previously.   She takes multiple medications including opioids.  Past Medical History:  Diagnosis Date  . Acid reflux   . Arthritis   . COPD (chronic obstructive pulmonary disease) ([facility])   . Depression   . Gastroparesis    INTOLERANT TO REGLAN; On marinol  . GERD (gastroesophageal reflux disease)   . Hemorrhoids 09/01/2011   external  . Hypercholesterolemia   . Hypertension   . Hypokalemia 11/01/10   requiring admission  . Irritable bowel syndrome (IBS)   . Restless leg syndrome   . Schatzki's ring    EGD 1/12  . Stroke ([facility])   . Thyroid disease     Past Surgical History:  Procedure Laterality Date  . CARPAL TUNNEL RELEASE     bilateral  . CHOLECYSTECTOMY  1978  . COLONOSCOPY  09/2006   hemorrhoids  . COLONOSCOPY  09/01/2011   Dr. Rylea Selway-external hemorrhoids, tubular adenoma. next tcs 08/2016  . ESOPHAGOGASTRODUODENOSCOPY  04/2010   Schatzki ring s/p dilation, minimal  retained food. Versed 7/Demeral 150/Phenergan12.5.  . ESOPHAGOGASTRODUODENOSCOPY  09/01/2011   Dr. Deshana Rominger-cervical esophageal web and schatzki's ring, small hiatal hernia, antral erosions-bx-mild chronic inflammation. circumferential distal esophageal erosions  . FRACTURE SURGERY  2011   right arm  . KIDNEY STONE SURGERY  2008  . TUBAL LIGATION  1972    Prior to Admission medications   Medication Sig Start Date End Date Taking? Authorizing Provider  ALPRAZolam (XANAX) 1 MG tablet TK 1 T PO TID PRN FOR ANXIETY 05/01/15  Yes Historical Provider, MD  amLODipine-benazepril (LOTREL) 10-20 MG per capsule Take 1 capsule by mouth every morning.    Yes Historical Provider, MD  clopidogrel (PLAVIX) 75 MG tablet Take 75 mg by mouth daily.   Yes Historical Provider, MD  HYDROcodone-acetaminophen (NORCO) 10-325 MG per tablet Take 1 tablet by mouth every 4 (four) hours as needed.  12/08/13  Yes Historical Provider, MD  hyoscyamine (ANASPAZ) 0.125 MG TBDP disintergrating tablet Place 0.125 mg under the tongue as needed.    Yes Historical Provider, MD  KLOR-CON M10 10 MEQ tablet Take 10 mEq by mouth daily.  12/02/13  Yes Historical Provider, MD  levothyroxine (SYNTHROID, LEVOTHROID) 75 MCG tablet Take 75 mcg by mouth daily before breakfast.   Yes Historical Provider, MD  Multiple Vitamin (MULTIVITAMIN) capsule Take 1 capsule by mouth daily.   Yes Historical Provider, MD  omeprazole (PRILOSEC) 20 MG capsule TAKE 1 CAPSULE BY MOUTH EVERY DAY 03/18/16  Yes Anna W Boone, NP  OVER THE COUNTER MEDICATION Vitamin D by mouth   daily (pt unsure of dose)   Yes Historical Provider, MD  pravastatin (PRAVACHOL) 20 MG tablet Take 20 mg by mouth daily.   Yes Historical Provider, MD  Probiotic Product (PROBIOTIC FORMULA PO) Take 1 capsule by mouth daily.    Yes Historical Provider, MD  promethazine (PHENERGAN) 25 MG tablet Take 25 mg by mouth every 6 (six) hours as needed for nausea.  12/08/13  Yes Historical Provider, MD    rOPINIRole (REQUIP) 1 MG tablet Take 1 mg by mouth at bedtime.     Yes Historical Provider, MD  torsemide (DEMADEX) 20 MG tablet Take 20 mg by mouth daily.   Yes Historical Provider, MD  clindamycin (CLEOCIN) 300 MG capsule Take 1 capsule (300 mg total) by mouth 4 (four) times daily. X 7 days Patient not taking: Reported on 06/17/2016 09/12/15   Donald Wickline, MD  dipyridamole-aspirin (AGGRENOX) 25-200 MG per 12 hr capsule Take 1 capsule by mouth 2 (two) times daily. Reported on 05/15/2015    Historical Provider, MD  furosemide (LASIX) 40 MG tablet Take 40 mg by mouth daily.  11/17/13   Historical Provider, MD    Allergies as of 06/17/2016 - Review Complete 06/17/2016  Allergen Reaction Noted  . Metoclopramide hcl Other (See Comments)   . Penicillins Anaphylaxis   . Zofran Other (See Comments) 11/26/2010    Family History  Problem Relation Age of Onset  . Cancer Father     ?leukemia  . Cancer Sister   . Colon cancer Brother 54  . Colon cancer Sister 67    recently diagnosed    Social History   Social History  . Marital status: Married    Spouse name: N/A  . Number of children: 3  . Years of education: N/A   Occupational History  .  Unemployed   Social History Main Topics  . Smoking status: Current Every Day Smoker    Packs/day: 1.75    Years: 53.00    Types: Cigarettes  . Smokeless tobacco: Never Used     Comment: one pack daily  . Alcohol use No  . Drug use: No  . Sexual activity: Not Currently   Other Topics Concern  . Not on file   Social History Narrative  . No narrative on file    Review of Systems: See HPI, otherwise negative ROS  Physical Exam: BP 123/69   Pulse 89   Temp 97.6 F (36.4 C) (Oral)   Ht 5' 3.5" (1.613 m)   Wt 204 lb 12.8 oz (92.9 kg)   BMI 35.71 kg/m  General:   Alert,  Well-developed, well-nourished, pleasant and cooperative in NAD Skin:  Intact without significant lesions or rashes. Neck:  Supple; no masses or thyromegaly. No  significant cervical adenopathy. Lungs:  Clear throughout to auscultation.   No wheezes, crackles, or rhonchi. No acute distress. Heart:  Regular rate and rhythm; no murmurs, clicks, rubs,  or gallops. Abdomen: Obese. Positive succussion splash  positive bowel sounds. Soft and nontender without appreciable mass or hepatosplenomegaly.  Pulses:  Normal pulses noted. Extremities:  Without clubbing or edema.  Impression:  Pleasant 70-year-old lady with refractory reflux symptoms, recurrent esophageal dysphagia in a setting of known reflux esophagitis and Schatzki's ring. Significant weight gain likely due to caloric excess.  History of colonic polyps and positive family history of colon cancer. She needs further evaluation of her upper GI tract and a surveillance colonoscopy at this time.  Bloating likely, in part, to idiopathic gastroparesis-likely a drug-induced   component/opioids, etc.   Recommendations:  I have offered an  EGD with ED and a surveillance colonoscopy. Will utilize deep sedation.  The risks, benefits, limitations, imponderables and alternatives regarding both EGD and colonoscopy have been reviewed with the patient. Questions have been answered.   All parties agreeable.   Will need extra half day of clear liquid prep. I suspect she may will likely need bolstering of her antireflux regimen. Again weight loss recommended, aerobic exercise, [employer] pool exercises would be a reasonable way to get more exercise.   GERD literature provided  Further recommendations to follow.    Notice: This dictation was prepared with Dragon dictation along with smaller phrase technology. Any transcriptional errors that result from this process are unintentional and may not be corrected upon review.      

## 2016-06-25 ENCOUNTER — Encounter: Payer: Self-pay | Admitting: Cardiovascular Disease

## 2016-06-25 ENCOUNTER — Ambulatory Visit: Payer: Medicare HMO | Admitting: Cardiovascular Disease

## 2016-07-02 NOTE — Patient Instructions (Signed)
AMBIKA ZETTLEMOYER  07/02/2016     @[email or web address] @   Your procedure is scheduled on  07/14/2016   Report to [facility] at  800   A.M.  Call this number if you have problems the morning of surgery:  [phone]   Remember:  Do not eat food or drink liquids after midnight.  Take these medicines the morning of surgery with A SIP OF WATER  Xanax, amlodipine, hydrocodone, levothyroxine, prilosec, phenergan.   Do not wear jewelry, make-up or nail polish.  Do not wear lotions, powders, or perfumes, or deoderant.  Do not shave 48 hours prior to surgery.  Men may shave face and neck.  Do not bring valuables to the hospital.  [facility] is not responsible for any belongings or valuables.  Contacts, dentures or bridgework may not be worn into surgery.  Leave your suitcase in the car.  After surgery it may be brought to your room.  For patients admitted to the hospital, discharge time will be determined by your treatment team.  Patients discharged the day of surgery will not be allowed to drive home.   Name and phone number of your driver:   family Special instructions:  Follow the diet and prep instructions given to you by Dr Roseanne Kaufman office.  Please read over the following fact sheets that you were given. Anesthesia Post-op Instructions and Care and Recovery After Surgery       Esophagogastroduodenoscopy Esophagogastroduodenoscopy (EGD) is a procedure to examine the lining of the esophagus, stomach, and first part of the small intestine (duodenum). This procedure is done to check for problems such as inflammation, bleeding, ulcers, or growths. During this procedure, a long, flexible, lighted tube with a camera attached (endoscope) is inserted down the throat. Tell a health care provider about:  Any allergies you have.  All medicines you are taking, including vitamins, herbs, eye drops, creams, and over-the-counter medicines.  Any problems you or  family members have had with anesthetic medicines.  Any blood disorders you have.  Any surgeries you have had.  Any medical conditions you have.  Whether you are pregnant or may be pregnant. What are the risks? Generally, this is a safe procedure. However, problems may occur, including:  Infection.  Bleeding.  A tear (perforation) in the esophagus, stomach, or duodenum.  Trouble breathing.  Excessive sweating.  Spasms of the larynx.  A slowed heartbeat.  Low blood pressure. What happens before the procedure?  Follow instructions from your health care provider about eating or drinking restrictions.  Ask your health care provider about:  Changing or stopping your regular medicines. This is especially important if you are taking diabetes medicines or blood thinners.  Taking medicines such as aspirin and ibuprofen. These medicines can thin your blood. Do not take these medicines before your procedure if your health care provider instructs you not to.  Plan to have someone take you home after the procedure.  If you wear dentures, be ready to remove them before the procedure. What happens during the procedure?  To reduce your risk of infection, your health care team will wash or sanitize their hands.  An IV tube will be put in a vein in your hand or arm. You will get medicines and fluids through this tube.  You will be given one or more of the following:  A medicine to help you relax (sedative).  A medicine to  numb the area (local anesthetic). This medicine may be sprayed into your throat. It will make you feel more comfortable and keep you from gagging or coughing during the procedure.  A medicine for pain.  A mouth guard may be placed in your mouth to protect your teeth and to keep you from biting on the endoscope.  You will be asked to lie on your left side.  The endoscope will be lowered down your throat into your esophagus, stomach, and duodenum.  Air will  be put into the endoscope. This will help your health care provider see better.  The lining of your esophagus, stomach, and duodenum will be examined.  Your health care provider may:  Take a tissue sample so it can be looked at in a lab (biopsy).  Remove growths.  Remove objects (foreign bodies) that are stuck.  Treat any bleeding with medicines or other devices that stop tissue from bleeding.  Widen (dilate) or stretch narrowed areas of your esophagus and stomach.  The endoscope will be taken out. The procedure may vary among health care providers and hospitals. What happens after the procedure?  Your blood pressure, heart rate, breathing rate, and blood oxygen level will be monitored often until the medicines you were given have worn off.  Do not eat or drink anything until the numbing medicine has worn off and your gag reflex has returned. This information is not intended to replace advice given to you by your health care provider. Make sure you discuss any questions you have with your health care provider. Document Released: 08/08/2004 Document Revised: 09/13/2015 Document Reviewed: 03/01/2015 Elsevier Interactive Patient Education  2017 [place]. Esophagogastroduodenoscopy, Care After Refer to this sheet in the next few weeks. These instructions provide you with information about caring for yourself after your procedure. Your health care provider may also give you more specific instructions. Your treatment has been planned according to current medical practices, but problems sometimes occur. Call your health care provider if you have any problems or questions after your procedure. What can I expect after the procedure? After the procedure, it is common to have:  A sore throat.  Nausea.  Bloating.  Dizziness.  Fatigue. Follow these instructions at home:  Do not eat or drink anything until the numbing medicine (local anesthetic) has worn off and your gag reflex has  returned. You will know that the local anesthetic has worn off when you can swallow comfortably.  Do not drive for 24 hours if you received a medicine to help you relax (sedative).  If your health care provider took a tissue sample for testing during the procedure, make sure to get your test results. This is your responsibility. Ask your health care provider or the department performing the test when your results will be ready.  Keep all follow-up visits as told by your health care provider. This is important. Contact a health care provider if:  You cannot stop coughing.  You are not urinating.  You are urinating less than usual. Get help right away if:  You have trouble swallowing.  You cannot eat or drink.  You have throat or chest pain that gets worse.  You are dizzy or light-headed.  You faint.  You have nausea or vomiting.  You have chills.  You have a fever.  You have severe abdominal pain.  You have black, tarry, or bloody stools. This information is not intended to replace advice given to you by your health care provider. Make sure  you discuss any questions you have with your health care provider. Document Released: 03/24/2012 Document Revised: 09/13/2015 Document Reviewed: 03/01/2015 Elsevier Interactive Patient Education  2017 [employer]  Esophageal Dilatation Esophageal dilatation is a procedure to open a blocked or narrowed part of the esophagus. The esophagus is the long tube in your throat that carries food and liquid from your mouth to your stomach. The procedure is also called esophageal dilation. You may need this procedure if you have a buildup of scar tissue in your esophagus that makes it difficult, painful, or even impossible to swallow. This can be caused by gastroesophageal reflux disease (GERD). In rare cases, people need this procedure because they have cancer of the esophagus or a problem with the way food moves through the esophagus. Sometimes  you may need to have another dilatation to enlarge the opening of the esophagus gradually. Tell a health care provider about:  Any allergies you have.  All medicines you are taking, including vitamins, herbs, eye drops, creams, and over-the-counter medicines.  Any problems you or family members have had with anesthetic medicines.  Any blood disorders you have.  Any surgeries you have had.  Any medical conditions you have.  Any antibiotic medicines you are required to take before dental procedures. What are the risks? Generally, this is a safe procedure. However, problems can occur and include:  Bleeding from a tear in the lining of the esophagus.  A hole (perforation) in the esophagus. What happens before the procedure?  Do not eat or drink anything after midnight on the night before the procedure or as directed by your health care provider.  Ask your health care provider about changing or stopping your regular medicines. This is especially important if you are taking diabetes medicines or blood thinners.  Plan to have someone take you home after the procedure. What happens during the procedure?  You will be given a medicine that makes you relaxed and sleepy (sedative).  A medicine may be sprayed or gargled to numb the back of the throat.  Your health care provider can use various instruments to do an esophageal dilatation. During the procedure, the instrument used will be placed in your mouth and passed down into your esophagus. Options include:  Simple dilators. This instrument is carefully placed in the esophagus to stretch it.  Guided wire bougies. In this method, a flexible tube (endoscope) is used to insert a wire into the esophagus. The dilator is passed over this wire to enlarge the esophagus. Then the wire is removed.  Balloon dilators. An endoscope with a small balloon at the end is passed down into the esophagus. Inflating the balloon gently stretches the esophagus  and opens it up. What happens after the procedure?  Your blood pressure, heart rate, breathing rate, and blood oxygen level will be monitored often until the medicines you were given have worn off.  Your throat may feel slightly sore and will probably still feel numb. This will improve slowly over time.  You will not be allowed to eat or drink until the throat numbness has resolved.  If this is a same-day procedure, you may be allowed to go home once you have been able to drink, urinate, and sit on the edge of the bed without nausea or dizziness.  If this is a same-day procedure, you should have a friend or family member with you for the next 24 hours after the procedure. This information is not intended to replace advice given to you  by your health care provider. Make sure you discuss any questions you have with your health care provider. Document Released: 05/29/2005 Document Revised: 09/13/2015 Document Reviewed: 08/17/2013 Elsevier Interactive Patient Education  2017 [place].  Colonoscopy, Adult A colonoscopy is an exam to look at the entire large intestine. During the exam, a lubricated, bendable tube is inserted into the anus and then passed into the rectum, colon, and other parts of the large intestine. A colonoscopy is often done as a part of normal colorectal screening or in response to certain symptoms, such as anemia, persistent diarrhea, abdominal pain, and blood in the stool. The exam can help screen for and diagnose medical problems, including:  Tumors.  Polyps.  Inflammation.  Areas of bleeding. Tell a health care provider about:  Any allergies you have.  All medicines you are taking, including vitamins, herbs, eye drops, creams, and over-the-counter medicines.  Any problems you or family members have had with anesthetic medicines.  Any blood disorders you have.  Any surgeries you have had.  Any medical conditions you have.  Any problems you have had  passing stool. What are the risks? Generally, this is a safe procedure. However, problems may occur, including:  Bleeding.  A tear in the intestine.  A reaction to medicines given during the exam.  Infection (rare). What happens before the procedure? Eating and drinking restrictions  Follow instructions from your health care provider about eating and drinking, which may include:  A few days before the procedure - follow a low-fiber diet. Avoid nuts, seeds, dried fruit, raw fruits, and vegetables.  1-3 days before the procedure - follow a clear liquid diet. Drink only clear liquids, such as clear broth or bouillon, black coffee or tea, clear juice, clear soft drinks or sports drinks, gelatin dessert, and popsicles. Avoid any liquids that contain red or purple dye.  On the day of the procedure - do not eat or drink anything during the 2 hours before the procedure, or within the time period that your health care provider recommends. Bowel prep  If you were prescribed an oral bowel prep to clean out your colon:  Take it as told by your health care provider. Starting the day before your procedure, you will need to drink a large amount of medicated liquid. The liquid will cause you to have multiple loose stools until your stool is almost clear or light green.  If your skin or anus gets irritated from diarrhea, you may use these to relieve the irritation:  Medicated wipes, such as adult wet wipes with aloe and vitamin E.  A skin soothing-product like petroleum jelly.  If you vomit while drinking the bowel prep, take a break for up to 60 minutes and then begin the bowel prep again. If vomiting continues and you cannot take the bowel prep without vomiting, call your health care provider. General instructions   Ask your health care provider about changing or stopping your regular medicines. This is especially important if you are taking diabetes medicines or blood thinners.  Plan to have  someone take you home from the hospital or clinic. What happens during the procedure?  An IV tube may be inserted into one of your veins.  You will be given medicine to help you relax (sedative).  To reduce your risk of infection:  Your health care team will wash or sanitize their hands.  Your anal area will be washed with soap.  You will be asked to lie on your side  with your knees bent.  Your health care provider will lubricate a long, thin, flexible tube. The tube will have a camera and a light on the end.  The tube will be inserted into your anus.  The tube will be gently eased through your rectum and colon.  Air will be delivered into your colon to keep it open. You may feel some pressure or cramping.  The camera will be used to take images during the procedure.  A small tissue sample may be removed from your body to be examined under a microscope (biopsy). If any potential problems are found, the tissue will be sent to a lab for testing.  If small polyps are found, your health care provider may remove them and have them checked for cancer cells.  The tube that was inserted into your anus will be slowly removed. The procedure may vary among health care providers and hospitals. What happens after the procedure?  Your blood pressure, heart rate, breathing rate, and blood oxygen level will be monitored until the medicines you were given have worn off.  Do not drive for 24 hours after the exam.  You may have a small amount of blood in your stool.  You may pass gas and have mild abdominal cramping or bloating due to the air that was used to inflate your colon during the exam.  It is up to you to get the results of your procedure. Ask your health care provider, or the department performing the procedure, when your results will be ready. This information is not intended to replace advice given to you by your health care provider. Make sure you discuss any questions you have  with your health care provider. Document Released: 04/04/2000 Document Revised: 02/06/2016 Document Reviewed: 06/19/2015 Elsevier Interactive Patient Education  2017 [employer]  Colonoscopy, Adult, Care After This sheet gives you information about how to care for yourself after your procedure. Your health care provider may also give you more specific instructions. If you have problems or questions, contact your health care provider. What can I expect after the procedure? After the procedure, it is common to have:  A small amount of blood in your stool for 24 hours after the procedure.  Some gas.  Mild abdominal cramping or bloating. Follow these instructions at home: General instructions    For the first 24 hours after the procedure:  Do not drive or use machinery.  Do not sign important documents.  Do not drink alcohol.  Do your regular daily activities at a slower pace than normal.  Eat soft, easy-to-digest foods.  Rest often.  Take over-the-counter or prescription medicines only as told by your health care provider.  It is up to you to get the results of your procedure. Ask your health care provider, or the department performing the procedure, when your results will be ready. Relieving cramping and bloating   Try walking around when you have cramps or feel bloated.  Apply heat to your abdomen as told by your health care provider. Use a heat source that your health care provider recommends, such as a moist heat pack or a heating pad.  Place a towel between your skin and the heat source.  Leave the heat on for 20-30 minutes.  Remove the heat if your skin turns bright red. This is especially important if you are unable to feel pain, heat, or cold. You may have a greater risk of getting burned. Eating and drinking   Drink enough fluid  to keep your urine clear or pale yellow.  Resume your normal diet as instructed by your health care provider. Avoid heavy or fried  foods that are hard to digest.  Avoid drinking alcohol for as long as instructed by your health care provider. Contact a health care provider if:  You have blood in your stool 2-3 days after the procedure. Get help right away if:  You have more than a small spotting of blood in your stool.  You pass large blood clots in your stool.  Your abdomen is swollen.  You have nausea or vomiting.  You have a fever.  You have increasing abdominal pain that is not relieved with medicine. This information is not intended to replace advice given to you by your health care provider. Make sure you discuss any questions you have with your health care provider. Document Released: 11/20/2003 Document Revised: 12/31/2015 Document Reviewed: 06/19/2015 Elsevier Interactive Patient Education  2017 [place] Anesthesia is a term that refers to techniques, procedures, and medicines that help a person stay safe and comfortable during a medical procedure. Monitored anesthesia care, or sedation, is one type of anesthesia. Your anesthesia specialist may recommend sedation if you will be having a procedure that does not require you to be unconscious, such as:  Cataract surgery.  A dental procedure.  A biopsy.  A colonoscopy. During the procedure, you may receive a medicine to help you relax (sedative). There are three levels of sedation:  Mild sedation. At this level, you may feel awake and relaxed. You will be able to follow directions.  Moderate sedation. At this level, you will be sleepy. You may not remember the procedure.  Deep sedation. At this level, you will be asleep. You will not remember the procedure. The more medicine you are given, the deeper your level of sedation will be. Depending on how you respond to the procedure, the anesthesia specialist may change your level of sedation or the type of anesthesia to fit your needs. An anesthesia specialist will monitor  you closely during the procedure. Let your health care provider know about:  Any allergies you have.  All medicines you are taking, including vitamins, herbs, eye drops, creams, and over-the-counter medicines.  Any use of steroids (by mouth or as a cream).  Any problems you or family members have had with sedatives and anesthetic medicines.  Any blood disorders you have.  Any surgeries you have had.  Any medical conditions you have, such as sleep apnea.  Whether you are pregnant or may be pregnant.  Any use of cigarettes, alcohol, or street drugs. What are the risks? Generally, this is a safe procedure. However, problems may occur, including:  Getting too much medicine (oversedation).  Nausea.  Allergic reaction to medicines.  Trouble breathing. If this happens, a breathing tube may be used to help with breathing. It will be removed when you are awake and breathing on your own.  Heart trouble.  Lung trouble. Before the procedure Staying hydrated  Follow instructions from your health care provider about hydration, which may include:  Up to 2 hours before the procedure - you may continue to drink clear liquids, such as water, clear fruit juice, black coffee, and plain tea. Eating and drinking restrictions  Follow instructions from your health care provider about eating and drinking, which may include:  8 hours before the procedure - stop eating heavy meals or foods such as meat, fried foods, or fatty foods.  6  hours before the procedure - stop eating light meals or foods, such as toast or cereal.  6 hours before the procedure - stop drinking milk or drinks that contain milk.  2 hours before the procedure - stop drinking clear liquids. Medicines  Ask your health care provider about:  Changing or stopping your regular medicines. This is especially important if you are taking diabetes medicines or blood thinners.  Taking medicines such as aspirin and ibuprofen. These  medicines can thin your blood. Do not take these medicines before your procedure if your health care provider instructs you not to. Tests and exams  You will have a physical exam.  You may have blood tests done to show:  How well your kidneys and liver are working.  How well your blood can clot.  General instructions  Plan to have someone take you home from the hospital or clinic.  If you will be going home right after the procedure, plan to have someone with you for 24 hours. What happens during the procedure?  Your blood pressure, heart rate, breathing, level of pain and overall condition will be monitored.  An IV tube will be inserted into one of your veins.  Your anesthesia specialist will give you medicines as needed to keep you comfortable during the procedure. This may mean changing the level of sedation.  The procedure will be performed. After the procedure  Your blood pressure, heart rate, breathing rate, and blood oxygen level will be monitored until the medicines you were given have worn off.  Do not drive for 24 hours if you received a sedative.  You may:  Feel sleepy, clumsy, or nauseous.  Feel forgetful about what happened after the procedure.  Have a sore throat if you had a breathing tube during the procedure.  Vomit. This information is not intended to replace advice given to you by your health care provider. Make sure you discuss any questions you have with your health care provider. Document Released: 01/01/2005 Document Revised: 09/14/2015 Document Reviewed: 07/29/2015 Elsevier Interactive Patient Education  2017 [place], Care After These instructions provide you with information about caring for yourself after your procedure. Your health care provider may also give you more specific instructions. Your treatment has been planned according to current medical practices, but problems sometimes occur. Call your health care  provider if you have any problems or questions after your procedure. What can I expect after the procedure? After your procedure, it is common to:  Feel sleepy for several hours.  Feel clumsy and have poor balance for several hours.  Feel forgetful about what happened after the procedure.  Have poor judgment for several hours.  Feel nauseous or vomit.  Have a sore throat if you had a breathing tube during the procedure. Follow these instructions at home: For at least 24 hours after the procedure:    Do not:  Participate in activities in which you could fall or become injured.  Drive.  Use heavy machinery.  Drink alcohol.  Take sleeping pills or medicines that cause drowsiness.  Make important decisions or sign legal documents.  Take care of children on your own.  Rest. Eating and drinking   Follow the diet that is recommended by your health care provider.  If you vomit, drink water, juice, or soup when you can drink without vomiting.  Make sure you have little or no nausea before eating solid foods. General instructions   Have a responsible adult stay with  you until you are awake and alert.  Take over-the-counter and prescription medicines only as told by your health care provider.  If you smoke, do not smoke without supervision.  Keep all follow-up visits as told by your health care provider. This is important. Contact a health care provider if:  You keep feeling nauseous or you keep vomiting.  You feel light-headed.  You develop a rash.  You have a fever. Get help right away if:  You have trouble breathing. This information is not intended to replace advice given to you by your health care provider. Make sure you discuss any questions you have with your health care provider. Document Released: 07/29/2015 Document Revised: 11/28/2015 Document Reviewed: 07/29/2015 Elsevier Interactive Patient Education  2017 [employer].

## 2016-07-08 ENCOUNTER — Encounter (HOSPITAL_COMMUNITY)
Admission: RE | Admit: 2016-07-08 | Discharge: 2016-07-08 | Disposition: A | Discharge status: Home / Self Care | Payer: Medicare HMO | Source: Ambulatory Visit | Attending: Internal Medicine | Admitting: Internal Medicine

## 2016-07-08 ENCOUNTER — Encounter (HOSPITAL_COMMUNITY): Payer: Self-pay

## 2016-07-08 DIAGNOSIS — Z79899 Other long term (current) drug therapy: Secondary | ICD-10-CM | POA: Diagnosis not present

## 2016-07-08 DIAGNOSIS — Z86010 Personal history of colonic polyps: Secondary | ICD-10-CM | POA: Insufficient documentation

## 2016-07-08 DIAGNOSIS — Z9049 Acquired absence of other specified parts of digestive tract: Secondary | ICD-10-CM | POA: Diagnosis not present

## 2016-07-08 DIAGNOSIS — F17210 Nicotine dependence, cigarettes, uncomplicated: Secondary | ICD-10-CM | POA: Diagnosis not present

## 2016-07-08 DIAGNOSIS — Z9851 Tubal ligation status: Secondary | ICD-10-CM | POA: Diagnosis not present

## 2016-07-08 DIAGNOSIS — I10 Essential (primary) hypertension: Secondary | ICD-10-CM | POA: Insufficient documentation

## 2016-07-08 DIAGNOSIS — Z9889 Other specified postprocedural states: Secondary | ICD-10-CM | POA: Insufficient documentation

## 2016-07-08 DIAGNOSIS — K219 Gastro-esophageal reflux disease without esophagitis: Secondary | ICD-10-CM | POA: Diagnosis not present

## 2016-07-08 DIAGNOSIS — Z01812 Encounter for preprocedural laboratory examination: Secondary | ICD-10-CM | POA: Insufficient documentation

## 2016-07-08 DIAGNOSIS — J449 Chronic obstructive pulmonary disease, unspecified: Secondary | ICD-10-CM | POA: Insufficient documentation

## 2016-07-08 DIAGNOSIS — R69 Illness, unspecified: Secondary | ICD-10-CM | POA: Diagnosis not present

## 2016-07-08 DIAGNOSIS — Z8673 Personal history of transient ischemic attack (TIA), and cerebral infarction without residual deficits: Secondary | ICD-10-CM | POA: Diagnosis not present

## 2016-07-08 HISTORY — DX: Personal history of urinary calculi: Z87.442

## 2016-07-08 HISTORY — DX: Anxiety disorder, unspecified: F41.9

## 2016-07-08 HISTORY — DX: Hypothyroidism, unspecified: E03.9

## 2016-07-08 LAB — BASIC METABOLIC PANEL
Anion gap: 10 (ref 5–15)
BUN: 12 mg/dL (ref 6–20)
CHLORIDE: 101 mmol/L (ref 101–111)
CO2: 25 mmol/L (ref 22–32)
Calcium: 9 mg/dL (ref 8.9–10.3)
Creatinine, Ser: 0.75 mg/dL (ref 0.44–1.00)
GFR calc Af Amer: >60 mL/min (ref >60)
GFR calc non Af Amer: >60 mL/min (ref >60)
Glucose, Bld: 131 mg/dL — ABNORMAL HIGH (ref 65–99)
POTASSIUM: 3.6 mmol/L (ref 3.5–5.1)
SODIUM: 136 mmol/L (ref 135–145)

## 2016-07-08 LAB — CBC WITH DIFFERENTIAL/PLATELET
Basophils Absolute: 0 10*3/uL (ref 0.0–0.1)
Basophils Relative: 0 %
EOS ABS: 0.2 10*3/uL (ref 0.0–0.7)
Eosinophils Relative: 3 %
HEMATOCRIT: 41.8 % (ref 36.0–46.0)
HEMOGLOBIN: 14.7 g/dL (ref 12.0–15.0)
LYMPHS ABS: 2.4 10*3/uL (ref 0.7–4.0)
Lymphocytes Relative: 35 %
MCH: 30.5 pg (ref 26.0–34.0)
MCHC: 35.2 g/dL (ref 30.0–36.0)
MCV: 86.7 fL (ref 78.0–100.0)
Monocytes Absolute: 0.5 10*3/uL (ref 0.1–1.0)
Monocytes Relative: 7 %
NEUTROS ABS: 3.8 10*3/uL (ref 1.7–7.7)
NEUTROS PCT: 55 %
Platelets: 197 10*3/uL (ref 150–400)
RBC: 4.82 MIL/uL (ref 3.87–5.11)
RDW: 14 % (ref 11.5–15.5)
WBC: 7 10*3/uL (ref 4.0–10.5)

## 2016-07-14 ENCOUNTER — Ambulatory Visit (HOSPITAL_COMMUNITY): Payer: Medicare HMO | Admitting: Anesthesiology

## 2016-07-14 ENCOUNTER — Encounter (HOSPITAL_COMMUNITY)
Admission: RE | Disposition: A | Discharge status: Home / Self Care | Payer: Self-pay | Source: Ambulatory Visit | Attending: Internal Medicine

## 2016-07-14 ENCOUNTER — Ambulatory Visit (HOSPITAL_COMMUNITY)
Admission: RE | Admit: 2016-07-14 | Discharge: 2016-07-14 | Disposition: A | Discharge status: Home / Self Care | Payer: Medicare HMO | Source: Ambulatory Visit | Attending: Internal Medicine | Admitting: Internal Medicine

## 2016-07-14 DIAGNOSIS — Z8673 Personal history of transient ischemic attack (TIA), and cerebral infarction without residual deficits: Secondary | ICD-10-CM | POA: Diagnosis not present

## 2016-07-14 DIAGNOSIS — Z1211 Encounter for screening for malignant neoplasm of colon: Secondary | ICD-10-CM | POA: Insufficient documentation

## 2016-07-14 DIAGNOSIS — D124 Benign neoplasm of descending colon: Secondary | ICD-10-CM | POA: Diagnosis not present

## 2016-07-14 DIAGNOSIS — R1314 Dysphagia, pharyngoesophageal phase: Secondary | ICD-10-CM | POA: Diagnosis not present

## 2016-07-14 DIAGNOSIS — E7800 Pure hypercholesterolemia, unspecified: Secondary | ICD-10-CM | POA: Diagnosis not present

## 2016-07-14 DIAGNOSIS — Z79899 Other long term (current) drug therapy: Secondary | ICD-10-CM | POA: Diagnosis not present

## 2016-07-14 DIAGNOSIS — F419 Anxiety disorder, unspecified: Secondary | ICD-10-CM | POA: Insufficient documentation

## 2016-07-14 DIAGNOSIS — J449 Chronic obstructive pulmonary disease, unspecified: Secondary | ICD-10-CM | POA: Diagnosis not present

## 2016-07-14 DIAGNOSIS — Z800 Family history of malignant neoplasm of digestive organs: Secondary | ICD-10-CM | POA: Diagnosis not present

## 2016-07-14 DIAGNOSIS — K210 Gastro-esophageal reflux disease with esophagitis: Secondary | ICD-10-CM | POA: Diagnosis not present

## 2016-07-14 DIAGNOSIS — E039 Hypothyroidism, unspecified: Secondary | ICD-10-CM | POA: Insufficient documentation

## 2016-07-14 DIAGNOSIS — G2581 Restless legs syndrome: Secondary | ICD-10-CM | POA: Insufficient documentation

## 2016-07-14 DIAGNOSIS — D122 Benign neoplasm of ascending colon: Secondary | ICD-10-CM

## 2016-07-14 DIAGNOSIS — R1310 Dysphagia, unspecified: Secondary | ICD-10-CM

## 2016-07-14 DIAGNOSIS — K219 Gastro-esophageal reflux disease without esophagitis: Secondary | ICD-10-CM | POA: Diagnosis not present

## 2016-07-14 DIAGNOSIS — F17210 Nicotine dependence, cigarettes, uncomplicated: Secondary | ICD-10-CM | POA: Diagnosis not present

## 2016-07-14 DIAGNOSIS — K589 Irritable bowel syndrome without diarrhea: Secondary | ICD-10-CM | POA: Diagnosis not present

## 2016-07-14 DIAGNOSIS — F329 Major depressive disorder, single episode, unspecified: Secondary | ICD-10-CM | POA: Diagnosis not present

## 2016-07-14 DIAGNOSIS — K222 Esophageal obstruction: Secondary | ICD-10-CM | POA: Diagnosis not present

## 2016-07-14 DIAGNOSIS — K5730 Diverticulosis of large intestine without perforation or abscess without bleeding: Secondary | ICD-10-CM | POA: Diagnosis not present

## 2016-07-14 DIAGNOSIS — Z86010 Personal history of colonic polyps: Secondary | ICD-10-CM

## 2016-07-14 DIAGNOSIS — K449 Diaphragmatic hernia without obstruction or gangrene: Secondary | ICD-10-CM | POA: Diagnosis not present

## 2016-07-14 DIAGNOSIS — K2950 Unspecified chronic gastritis without bleeding: Secondary | ICD-10-CM | POA: Insufficient documentation

## 2016-07-14 DIAGNOSIS — Z7902 Long term (current) use of antithrombotics/antiplatelets: Secondary | ICD-10-CM | POA: Diagnosis not present

## 2016-07-14 DIAGNOSIS — K3184 Gastroparesis: Secondary | ICD-10-CM | POA: Diagnosis not present

## 2016-07-14 DIAGNOSIS — I10 Essential (primary) hypertension: Secondary | ICD-10-CM | POA: Diagnosis not present

## 2016-07-14 DIAGNOSIS — Z9049 Acquired absence of other specified parts of digestive tract: Secondary | ICD-10-CM | POA: Insufficient documentation

## 2016-07-14 HISTORY — PX: ESOPHAGOGASTRODUODENOSCOPY (EGD) WITH PROPOFOL: SHX5813

## 2016-07-14 HISTORY — PX: BIOPSY: SHX5522

## 2016-07-14 HISTORY — PX: MALONEY DILATION: SHX5535

## 2016-07-14 HISTORY — PX: COLONOSCOPY WITH PROPOFOL: SHX5780

## 2016-07-14 HISTORY — PX: POLYPECTOMY: SHX5525

## 2016-07-14 SURGERY — COLONOSCOPY WITH PROPOFOL
Anesthesia: Monitor Anesthesia Care

## 2016-07-14 MED ORDER — GLYCOPYRROLATE 0.2 MG/ML IJ SOLN
INTRAMUSCULAR | Status: AC
  Filled 2016-07-14: qty 1

## 2016-07-14 MED ORDER — LACTATED RINGERS IV SOLN
INTRAVENOUS | Status: DC
  Administered 2016-07-14: 1000 mL via INTRAVENOUS

## 2016-07-14 MED ORDER — CHLORHEXIDINE GLUCONATE CLOTH 2 % EX PADS: 6.0000 | MEDICATED_PAD | Freq: Once | CUTANEOUS | Status: DC

## 2016-07-14 MED ORDER — LIDOCAINE HCL (PF) 1 % IJ SOLN
INTRAMUSCULAR | Status: AC
Start: 2016-07-14 — End: 2016-07-14
  Filled 2016-07-14: qty 5

## 2016-07-14 MED ORDER — PROPOFOL 500 MG/50ML IV EMUL
INTRAVENOUS | Status: DC | PRN
  Administered 2016-07-14: 85 ug/kg/min via INTRAVENOUS
  Administered 2016-07-14: 75 ug/kg/min via INTRAVENOUS
  Administered 2016-07-14: 80 ug/kg/min via INTRAVENOUS

## 2016-07-14 MED ORDER — PROPOFOL 10 MG/ML IV BOLUS
INTRAVENOUS | Status: AC
  Filled 2016-07-14: qty 60

## 2016-07-14 MED ORDER — MIDAZOLAM HCL 2 MG/2ML IJ SOLN
1.0000 mg | INTRAMUSCULAR | Status: AC
Start: 2016-07-14 — End: 2016-07-14
  Administered 2016-07-14 (×2): 2 mg via INTRAVENOUS
  Filled 2016-07-14: qty 2

## 2016-07-14 MED ORDER — LIDOCAINE VISCOUS 2 % MT SOLN
OROMUCOSAL | Status: AC
  Filled 2016-07-14: qty 15

## 2016-07-14 MED ORDER — MIDAZOLAM HCL 2 MG/2ML IJ SOLN
INTRAMUSCULAR | Status: AC
  Filled 2016-07-14: qty 2

## 2016-07-14 MED ORDER — GLYCOPYRROLATE 0.2 MG/ML IJ SOLN
0.2000 mg | Freq: Once | INTRAMUSCULAR | Status: AC | PRN
  Administered 2016-07-14: 0.2 mg via INTRAVENOUS

## 2016-07-14 MED ORDER — LIDOCAINE HCL (CARDIAC) 10 MG/ML IV SOLN
INTRAVENOUS | Status: DC | PRN
  Administered 2016-07-14: 50 mg via INTRAVENOUS

## 2016-07-14 MED ORDER — LIDOCAINE VISCOUS 2 % MT SOLN
5.0000 mL | Freq: Once | OROMUCOSAL | Status: AC
  Administered 2016-07-14: 5 mL via OROMUCOSAL

## 2016-07-14 MED ORDER — FENTANYL CITRATE (PF) 100 MCG/2ML IJ SOLN
INTRAMUSCULAR | Status: AC
  Filled 2016-07-14: qty 2

## 2016-07-14 MED ORDER — PROPOFOL 10 MG/ML IV BOLUS
INTRAVENOUS | Status: DC | PRN
  Administered 2016-07-14 (×6): 20 mg via INTRAVENOUS

## 2016-07-14 MED ORDER — FENTANYL CITRATE (PF) 100 MCG/2ML IJ SOLN
25.0000 ug | INTRAMUSCULAR | Status: AC
  Administered 2016-07-14 (×2): 25 ug via INTRAVENOUS

## 2016-07-14 NOTE — Transfer of Care (Signed)
Immediate Anesthesia Transfer of Care Note  Patient: Tracey Mccullough  Procedure(s) Performed: Procedure(s) with comments: COLONOSCOPY WITH PROPOFOL (N/A) - 930 ESOPHAGOGASTRODUODENOSCOPY (EGD) WITH PROPOFOL (N/A) MALONEY DILATION (N/A) BIOPSY - gastric POLYPECTOMY - colon  Patient Location: PACU  Anesthesia Type:MAC  Level of Consciousness: awake and alert   Airway & Oxygen Therapy: Patient Spontanous Breathing and Patient connected to nasal cannula oxygen  Post-op Assessment: Report given to RN and Post -op Vital signs reviewed and stable  Post vital signs: Reviewed and stable  Last Vitals:  Vitals:   07/14/16 0925 07/14/16 0930  BP: 129/73 127/76  Pulse:    Resp: (!) 26 10  Temp:      Last Pain:  Vitals:   07/14/16 0845  PainSc: 8          Complications: No apparent anesthesia complications

## 2016-07-14 NOTE — H&P (View-Only) (Signed)
Primary Care Physician:  Alonza Bogus, MD Primary Gastroenterologist:  Dr. Gala Romney  Pre-Procedure History & Physical: HPI:  Tracey Mccullough is a 71 y.o. female here for follow-up of GERD and history of colonic polyps. Has had more abdominal bloating lately. Has gained 18 pounds since she was last seen last year. Has not started an exercise program. Does not go to the [facility], etc. Seen by Dr. Luan Pulling recently; he changed her fluid medication around. States less ankle edema. Has bloating discomfort; frequent reflux symptoms in spite of omeprazole 20 mg daily. In fact, has recurrent esophageal dysphagia. No change in bowel habits. History of multiple adenomas removed 5 years ago; sister with colon cancer. Due for surveillance colonoscopy at this time. Significant breakthrough reflux symptoms at least 3 times weekly. Since last seen,  switched from Aggrenox to Plavix. No anticoagulation. Large amount of conscious sedation medication required at her last endoscopy session. Patient reports sedation was suboptimal previously.   She takes multiple medications including opioids.  Past Medical History:  Diagnosis Date  . Acid reflux   . Arthritis   . COPD (chronic obstructive pulmonary disease) ([place])   . Depression   . Gastroparesis    INTOLERANT TO REGLAN; On marinol  . GERD (gastroesophageal reflux disease)   . Hemorrhoids 09/01/2011   external  . Hypercholesterolemia   . Hypertension   . Hypokalemia 11/01/10   requiring admission  . Irritable bowel syndrome (IBS)   . Restless leg syndrome   . Schatzki's ring    EGD 1/12  . Stroke ([place])   . Thyroid disease     Past Surgical History:  Procedure Laterality Date  . CARPAL TUNNEL RELEASE     bilateral  . CHOLECYSTECTOMY  1978  . COLONOSCOPY  09/2006   hemorrhoids  . COLONOSCOPY  09/01/2011   Dr. Camillia Herter hemorrhoids, tubular adenoma. next tcs 08/2016  . ESOPHAGOGASTRODUODENOSCOPY  04/2010   Schatzki ring s/p dilation, minimal  retained food. Versed 7/Demeral 150/Phenergan12.5.  . ESOPHAGOGASTRODUODENOSCOPY  09/01/2011   Dr. Leodis Binet esophageal web and schatzki's ring, small hiatal hernia, antral erosions-bx-mild chronic inflammation. circumferential distal esophageal erosions  . FRACTURE SURGERY  2011   right arm  . KIDNEY STONE SURGERY  2008  . TUBAL LIGATION  1972    Prior to Admission medications   Medication Sig Start Date End Date Taking? Authorizing Provider  ALPRAZolam (XANAX) 1 MG tablet TK 1 T PO TID PRN FOR ANXIETY 05/01/15  Yes Historical Provider, MD  amLODipine-benazepril (LOTREL) 10-20 MG per capsule Take 1 capsule by mouth every morning.    Yes Historical Provider, MD  clopidogrel (PLAVIX) 75 MG tablet Take 75 mg by mouth daily.   Yes Historical Provider, MD  HYDROcodone-acetaminophen (NORCO) 10-325 MG per tablet Take 1 tablet by mouth every 4 (four) hours as needed.  12/08/13  Yes Historical Provider, MD  hyoscyamine (ANASPAZ) 0.125 MG TBDP disintergrating tablet Place 0.125 mg under the tongue as needed.    Yes Historical Provider, MD  KLOR-CON M10 10 MEQ tablet Take 10 mEq by mouth daily.  12/02/13  Yes Historical Provider, MD  levothyroxine (SYNTHROID, LEVOTHROID) 75 MCG tablet Take 75 mcg by mouth daily before breakfast.   Yes Historical Provider, MD  Multiple Vitamin (MULTIVITAMIN) capsule Take 1 capsule by mouth daily.   Yes Historical Provider, MD  omeprazole (PRILOSEC) 20 MG capsule TAKE 1 CAPSULE BY MOUTH EVERY DAY 03/18/16  Yes Annitta Needs, NP  OVER THE COUNTER MEDICATION Vitamin D by mouth  daily (pt unsure of dose)   Yes Historical Provider, MD  pravastatin (PRAVACHOL) 20 MG tablet Take 20 mg by mouth daily.   Yes Historical Provider, MD  Probiotic Product (PROBIOTIC FORMULA PO) Take 1 capsule by mouth daily.    Yes Historical Provider, MD  promethazine (PHENERGAN) 25 MG tablet Take 25 mg by mouth every 6 (six) hours as needed for nausea.  12/08/13  Yes Historical Provider, MD    rOPINIRole (REQUIP) 1 MG tablet Take 1 mg by mouth at bedtime.     Yes Historical Provider, MD  torsemide (DEMADEX) 20 MG tablet Take 20 mg by mouth daily.   Yes Historical Provider, MD  clindamycin (CLEOCIN) 300 MG capsule Take 1 capsule (300 mg total) by mouth 4 (four) times daily. X 7 days Patient not taking: Reported on 06/17/2016 09/12/15   Ripley Fraise, MD  dipyridamole-aspirin (AGGRENOX) 25-200 MG per 12 hr capsule Take 1 capsule by mouth 2 (two) times daily. Reported on 05/15/2015    Historical Provider, MD  furosemide (LASIX) 40 MG tablet Take 40 mg by mouth daily.  11/17/13   Historical Provider, MD    Allergies as of 06/17/2016 - Review Complete 06/17/2016  Allergen Reaction Noted  . Metoclopramide hcl Other (See Comments)   . Penicillins Anaphylaxis   . Zofran Other (See Comments) 11/26/2010    Family History  Problem Relation Age of Onset  . Cancer Father     ?leukemia  . Cancer Sister   . Colon cancer Brother 53  . Colon cancer Sister 43    recently diagnosed    Social History   Social History  . Marital status: Married    Spouse name: N/A  . Number of children: 3  . Years of education: N/A   Occupational History  .  Unemployed   Social History Main Topics  . Smoking status: Current Every Day Smoker    Packs/day: 1.75    Years: 53.00    Types: Cigarettes  . Smokeless tobacco: Never Used     Comment: one pack daily  . Alcohol use No  . Drug use: No  . Sexual activity: Not Currently   Other Topics Concern  . Not on file   Social History Narrative  . No narrative on file    Review of Systems: See HPI, otherwise negative ROS  Physical Exam: BP 123/69   Pulse 89   Temp 97.6 F (36.4 C) (Oral)   Ht 5' 3.5" (1.613 m)   Wt 204 lb 12.8 oz (92.9 kg)   BMI 35.71 kg/m  General:   Alert,  Well-developed, well-nourished, pleasant and cooperative in NAD Skin:  Intact without significant lesions or rashes. Neck:  Supple; no masses or thyromegaly. No  significant cervical adenopathy. Lungs:  Clear throughout to auscultation.   No wheezes, crackles, or rhonchi. No acute distress. Heart:  Regular rate and rhythm; no murmurs, clicks, rubs,  or gallops. Abdomen: Obese. Positive succussion splash  positive bowel sounds. Soft and nontender without appreciable mass or hepatosplenomegaly.  Pulses:  Normal pulses noted. Extremities:  Without clubbing or edema.  Impression:  Pleasant 71 year old lady with refractory reflux symptoms, recurrent esophageal dysphagia in a setting of known reflux esophagitis and Schatzki's ring. Significant weight gain likely due to caloric excess.  History of colonic polyps and positive family history of colon cancer. She needs further evaluation of her upper GI tract and a surveillance colonoscopy at this time.  Bloating likely, in part, to idiopathic gastroparesis-likely a drug-induced  component/opioids, etc.   Recommendations:  I have offered an  EGD with ED and a surveillance colonoscopy. Will utilize deep sedation.  The risks, benefits, limitations, imponderables and alternatives regarding both EGD and colonoscopy have been reviewed with the patient. Questions have been answered.   All parties agreeable.   Will need extra half day of clear liquid prep. I suspect she may will likely need bolstering of her antireflux regimen. Again weight loss recommended, aerobic exercise, [employer] pool exercises would be a reasonable way to get more exercise.   GERD literature provided  Further recommendations to follow.    Notice: This dictation was prepared with Dragon dictation along with smaller phrase technology. Any transcriptional errors that result from this process are unintentional and may not be corrected upon review.

## 2016-07-14 NOTE — Anesthesia Postprocedure Evaluation (Signed)
Anesthesia Post Note  Patient: Tracey Mccullough  Procedure(s) Performed: Procedure(s) (LRB): COLONOSCOPY WITH PROPOFOL (N/A) ESOPHAGOGASTRODUODENOSCOPY (EGD) WITH PROPOFOL (N/A) MALONEY DILATION (N/A) BIOPSY POLYPECTOMY  Patient location during evaluation: PACU Anesthesia Type: MAC Level of consciousness: awake and alert Pain management: pain level controlled Vital Signs Assessment: post-procedure vital signs reviewed and stable Respiratory status: spontaneous breathing and patient connected to nasal cannula oxygen Cardiovascular status: stable Anesthetic complications: no     Last Vitals:  Vitals:   07/14/16 0925 07/14/16 0930  BP: 129/73 127/76  Pulse:    Resp: (!) 26 10  Temp:      Last Pain:  Vitals:   07/14/16 0845  PainSc: 8                  Starsha Morning

## 2016-07-14 NOTE — Anesthesia Procedure Notes (Signed)
Procedure Name: MAC Date/Time: 07/14/2016 9:32 AM Performed by: Vista Deck Pre-anesthesia Checklist: Patient identified, Emergency Drugs available, Suction available, Timeout performed and Patient being monitored Patient Re-evaluated:Patient Re-evaluated prior to inductionOxygen Delivery Method: Non-rebreather mask

## 2016-07-14 NOTE — Op Note (Signed)
[facility] Patient Name: Tracey Mccullough Procedure Date: 07/14/2016 9:00 AM MRN: [id number] Date of Birth: 1946-01-10 Attending MD: Norvel Richards , MD CSN: [id number] Age: 71 Admit Type: Outpatient Procedure:                Upper GI endoscopy wiith Maloney dilation and                            gastric biopsy Indications:              Dysphagia Providers:                Norvel Richards, MD, Jeanann Lewandowsky. Leani Seller, RN,                            Aram Candela Referring MD:              Medicines:                Propofol per Anesthesia Complications:            No immediate complications. Estimated Blood Loss:     Estimated blood loss was minimal. Procedure:                Pre-Anesthesia Assessment:                           - Prior to the procedure, a History and Physical                            was performed, and patient medications and                            allergies were reviewed. The patient's tolerance of                            previous anesthesia was also reviewed. The risks                            and benefits of the procedure and the sedation                            options and risks were discussed with the patient.                            All questions were answered, and informed consent                            was obtained. Prior Anticoagulants: The patient has                            taken no previous anticoagulant or antiplatelet                            agents. ASA Grade Assessment: III - A patient with  severe systemic disease. After reviewing the risks                            and benefits, the patient was deemed in                            satisfactory condition to undergo the procedure.                           After obtaining informed consent, the endoscope was                            passed under direct vision. Throughout the                            procedure, the patient's blood  pressure, pulse, and                            oxygen saturations were monitored continuously. The                            [id number] ([id number]) scope was introduced through the                            and advanced to the second part of duodenum. The                            upper GI endoscopy was accomplished without                            difficulty. The patient tolerated the procedure                            well. Scope In: 9:44:42 AM Scope Out: 9:53:35 AM Total Procedure Duration: 0 hours 8 minutes 53 seconds  Findings:      A mild Schatzki ring was found at the gastroesophageal junction.      A small hiatal hernia was present. Gastric erosions present. No ulcer or       infiltrating process..      The duodenal bulb and second portion of the duodenum were normal. The       scope was withdrawn. Dilation was performed with a Maloney dilator with       no resistance at 42 Fr. The scope was withdrawn. Dilation was performed       with a Maloney dilator with mild resistance at 56 Fr. The dilation site       was examined following endoscope reinsertion and showed mild improvement       in luminal narrowing. Estimated blood loss was minimal. Finally,       abnormal gastric mucosa was biopsied with a cold forceps for histology.       Estimated blood loss was minimal Impression:               - Mild Schatzki ring. Dilated.                           -  Small hiatal hernia. gastric erosions. Biopsied.                           - Normal duodenal bulb and second portion of the                            duodenum. Moderate Sedation:      Moderate (conscious) sedation was personally administered by an       anesthesia professional. The following parameters were monitored: oxygen       saturation, heart rate, blood pressure, respiratory rate, EKG, adequacy       of pulmonary ventilation, and response to care. Total physician       intraservice time was 36 minutes. Recommendation:            - Patient has a contact number available for                            emergencies. The signs and symptoms of potential                            delayed complications were discussed with the                            patient. Return to normal activities tomorrow.                            Written discharge instructions were provided to the                            patient.                           - Patient has a contact number available for                            emergencies. The signs and symptoms of potential                            delayed complications were discussed with the                            patient. Return to normal activities tomorrow.                            Written discharge instructions were provided to the                            patient.                           - Resume previous diet.                           - Continue present medications. Increase omeprazole  to 40 mg daily.                           - Return to my office in 3 months. Procedure Code(s):        --- Professional ---                           [phone], Esophagogastroduodenoscopy, flexible,                            transoral; with biopsy, single or multiple                           43450, Dilation of esophagus, by unguided sound or                            bougie, single or multiple passes Diagnosis Code(s):        --- Professional ---                           K22.2, Esophageal obstruction                           K44.9, Diaphragmatic hernia without obstruction or                            gangrene                           R13.10, Dysphagia, unspecified CPT copyright 2016 American Medical Association. All rights reserved. The codes documented in this report are preliminary and upon coder review may  be revised to meet current compliance requirements. Cristopher Estimable. Jahan Friedlander, MD Norvel Richards, MD 07/14/2016 10:26:00 AM This report has been  signed electronically. Number of Addenda: 0

## 2016-07-14 NOTE — Anesthesia Preprocedure Evaluation (Signed)
Anesthesia Evaluation  Patient identified by MRN, date of birth, ID band Patient awake    Reviewed: Allergy & Precautions, NPO status , Patient's Chart, lab work & pertinent test results  History of Anesthesia Complications (+) history of anesthetic complications (failed conscious sedation in endoscopy suite)  Airway Mallampati: I  TM Distance: >3 FB     Dental  (+) Edentulous Upper, Edentulous Lower   Pulmonary COPD, Current Smoker,    breath sounds clear to auscultation       Cardiovascular hypertension, Pt. on medications  Rhythm:Regular Rate:Normal     Neuro/Psych  Headaches, PSYCHIATRIC DISORDERS Anxiety Depression CVA, No Residual Symptoms    GI/Hepatic GERD  Poorly Controlled,  Endo/Other  Hypothyroidism   Renal/GU      Musculoskeletal   Abdominal   Peds  Hematology   Anesthesia Other Findings   Reproductive/Obstetrics                             Anesthesia Physical Anesthesia Plan  ASA: III  Anesthesia Plan: MAC   Post-op Pain Management:    Induction: Intravenous  Airway Management Planned: Simple Face Mask  Additional Equipment:   Intra-op Plan:   Post-operative Plan:   Informed Consent: I have reviewed the patients History and Physical, chart, labs and discussed the procedure including the risks, benefits and alternatives for the proposed anesthesia with the patient or authorized representative who has indicated his/her understanding and acceptance.     Plan Discussed with:   Anesthesia Plan Comments:         Anesthesia Quick Evaluation

## 2016-07-14 NOTE — Op Note (Signed)
[facility] Patient Name: Birtha Hatler Procedure Date: 07/14/2016 9:55 AM MRN: [id number] Date of Birth: [birth year] Attending MD: Norvel Richards , MD CSN: [id number] Age: 71 Admit Type: Outpatient Procedure:                Colonoscopy with multiple snare polypectomies Indications:              High risk colon cancer surveillance: Personal                            history of colonic polyps Providers:                Norvel Richards, MD, Otis Peak B. Camren Seller, RN,                            Aram Candela Referring MD:             Jasper Loser. Luan Pulling, MD Medicines:                Propofol per Anesthesia Complications:            No immediate complications. Estimated Blood Loss:     Estimated blood loss was minimal. Procedure:                Pre-Anesthesia Assessment:                           - Prior to the procedure, a History and Physical                            was performed, and patient medications and                            allergies were reviewed. The patient's tolerance of                            previous anesthesia was also reviewed. The risks                            and benefits of the procedure and the sedation                            options and risks were discussed with the patient.                            All questions were answered, and informed consent                            was obtained. Prior Anticoagulants: The patient has                            taken no previous anticoagulant or antiplatelet                            agents. ASA Grade Assessment: III - A patient with  severe systemic disease. After reviewing the risks                            and benefits, the patient was deemed in                            satisfactory condition to undergo the procedure.                           After obtaining informed consent, the colonoscope                            was passed under direct vision. Throughout  the                            procedure, the patient's blood pressure, pulse, and                            oxygen saturations were monitored continuously. The                            [id number] ([id number]) scope was introduced through                            the and advanced to the the cecum, identified by                            appendiceal orifice and ileocecal valve. The                            colonoscopy was performed without difficulty. The                            patient tolerated the procedure well. The quality                            of the bowel preparation was adequate. The entire                            colon was well visualized. The ileocecal valve,                            appendiceal orifice, and rectum were photographed.                            The quality of the bowel preparation was adequate. Scope In: 9:57:58 AM Scope Out: 10:18:12 AM Scope Withdrawal Time: 0 hours 7 minutes 6 seconds  Total Procedure Duration: 0 hours 20 minutes 14 seconds  Findings:      The perianal and digital rectal examinations were normal.      Eight semi-pedunculated polyps were found in the descending colon and       ascending colon. The polyps were 4 to 8 mm in size. These polyps were       removed with a  cold snare. Resection and retrieval were complete.       Estimated blood loss was minimal.      Scattered small and large-mouthed diverticula were found in the sigmoid       colon and descending colon.      The exam was otherwise without abnormality on direct and retroflexion       views. Impression:               - Eight 4 to 8 mm polyps in the descending colon                            and in the ascending colon, removed with a cold                            snare. Resected and retrieved.                           - Diverticulosis in the sigmoid colon and in the                            descending colon.                           - The examination was  otherwise normal on direct                            and retroflexion views. Moderate Sedation:      Moderate (conscious) sedation was personally administered by an       anesthesia professional. The following parameters were monitored: oxygen       saturation, heart rate, blood pressure, respiratory rate, EKG, adequacy       of pulmonary ventilation, and response to care. Total physician       intraservice time was 36 minutes. Recommendation:           - Written discharge instructions were provided to                            the patient.                           - The signs and symptoms of potential delayed                            complications were discussed with the patient.                           - Patient has a contact number available for                            emergencies.                           - Return to normal activities tomorrow.                           - Resume previous diet.                           -  Continue present medications.                           - Repeat colonoscopy date to be determined after                            pending pathology results are reviewed for                            surveillance based on pathology results. See EGD                            report.                           - Return to GI office in 3 months. Procedure Code(s):        --- Professional ---                           [phone], Colonoscopy, flexible; with removal of                            tumor(s), polyp(s), or other lesion(s) by snare                            technique Diagnosis Code(s):        --- Professional ---                           Z86.010, Personal history of colonic polyps                           D12.4, Benign neoplasm of descending colon                           D12.2, Benign neoplasm of ascending colon                           K57.30, Diverticulosis of large intestine without                            perforation or abscess without  bleeding CPT copyright 2016 American Medical Association. All rights reserved. The codes documented in this report are preliminary and upon coder review may  be revised to meet current compliance requirements. Cristopher Estimable. Cinque Begley, MD Norvel Richards, MD 07/14/2016 10:30:50 AM This report has been signed electronically. Number of Addenda: 0

## 2016-07-14 NOTE — Interval H&P Note (Signed)
History and Physical Interval Note:  07/14/2016 9:30 AM  Tracey Mccullough  has presented today for surgery, with the diagnosis of history of polpys/GERD  The various methods of treatment have been discussed with the patient and family. After consideration of risks, benefits and other options for treatment, the patient has consented to  Procedure(s) with comments: COLONOSCOPY WITH PROPOFOL (N/A) - 930 ESOPHAGOGASTRODUODENOSCOPY (EGD) WITH PROPOFOL (N/A) MALONEY DILATION (N/A) as a surgical intervention .  The patient's history has been reviewed, patient examined, no change in status, stable for surgery.  I have reviewed the patient's chart and labs.  Questions were answered to the patient's satisfaction.     Tracey Mccullough  No change. EGD/EGD. Surveillance colonoscopy per plan.  The risks, benefits, limitations, imponderables and alternatives regarding both EGD and colonoscopy have been reviewed with the patient. Questions have been answered. All parties agreeable.

## 2016-07-14 NOTE — Discharge Instructions (Addendum)
Colonoscopy Discharge Instructions  Read the instructions outlined below and refer to this sheet in the next few weeks. These discharge instructions provide you with general information on caring for yourself after you leave the hospital. Your doctor may also give you specific instructions. While your treatment has been planned according to the most current medical practices available, unavoidable complications occasionally occur. If you have any problems or questions after discharge, call Dr. Gala Romney at [phone]. ACTIVITY  You may resume your regular activity, but move at a slower pace for the next 24 hours.   Take frequent rest periods for the next 24 hours.   Walking will help get rid of the air and reduce the bloated feeling in your belly (abdomen).   No driving for 24 hours (because of the medicine (anesthesia) used during the test).    Do not sign any important legal documents or operate any machinery for 24 hours (because of the anesthesia used during the test).  NUTRITION  Drink plenty of fluids.   You may resume your normal diet as instructed by your doctor.   Begin with a light meal and progress to your normal diet. Heavy or fried foods are harder to digest and may make you feel sick to your stomach (nauseated).   Avoid alcoholic beverages for 24 hours or as instructed.  MEDICATIONS  You may resume your normal medications unless your doctor tells you otherwise.  WHAT YOU CAN EXPECT TODAY  Some feelings of bloating in the abdomen.   Passage of more gas than usual.   Spotting of blood in your stool or on the toilet paper.  IF YOU HAD POLYPS REMOVED DURING THE COLONOSCOPY:  No aspirin products for 7 days or as instructed.   No alcohol for 7 days or as instructed.   Eat a soft diet for the next 24 hours.  FINDING OUT THE RESULTS OF YOUR TEST Not all test results are available during your visit. If your test results are not back during the visit, make an appointment  with your caregiver to find out the results. Do not assume everything is normal if you have not heard from your caregiver or the medical facility. It is important for you to follow up on all of your test results.  SEEK IMMEDIATE MEDICAL ATTENTION IF:  You have more than a spotting of blood in your stool.   Your belly is swollen (abdominal distention).   You are nauseated or vomiting.   You have a temperature over 101.   You have abdominal pain or discomfort that is severe or gets worse throughout the day.    EGD Discharge instructions Please read the instructions outlined below and refer to this sheet in the next few weeks. These discharge instructions provide you with general information on caring for yourself after you leave the hospital. Your doctor may also give you specific instructions. While your treatment has been planned according to the most current medical practices available, unavoidable complications occasionally occur. If you have any problems or questions after discharge, please call your doctor. ACTIVITY  You may resume your regular activity but move at a slower pace for the next 24 hours.   Take frequent rest periods for the next 24 hours.   Walking will help expel (get rid of) the air and reduce the bloated feeling in your abdomen.   No driving for 24 hours (because of the anesthesia (medicine) used during the test).   You may shower.   Do not sign  any important legal documents or operate any machinery for 24 hours (because of the anesthesia used during the test).  NUTRITION  Drink plenty of fluids.   You may resume your normal diet.   Begin with a light meal and progress to your normal diet.   Avoid alcoholic beverages for 24 hours or as instructed by your caregiver.  MEDICATIONS  You may resume your normal medications unless your caregiver tells you otherwise.  WHAT YOU CAN EXPECT TODAY  You may experience abdominal discomfort such as a feeling of  fullness or gas pains.  FOLLOW-UP  Your doctor will discuss the results of your test with you.  SEEK IMMEDIATE MEDICAL ATTENTION IF ANY OF THE FOLLOWING OCCUR:  Excessive nausea (feeling sick to your stomach) and/or vomiting.   Severe abdominal pain and distention (swelling).   Trouble swallowing.   Temperature over 101 F (37.8 C).   Rectal bleeding or vomiting of blood.     GERD and colon polyp information provided  Increase omeprazole to 40 mg daily  Further recommendations to follow pending review of pathology report  Office visit with [place] in 3 months    Gastroesophageal Reflux Disease, Adult Normally, food travels down the esophagus and stays in the stomach to be digested. If a person has gastroesophageal reflux disease (GERD), food and stomach acid move back up into the esophagus. When this happens, the esophagus becomes sore and swollen (inflamed). Over time, GERD can make small holes (ulcers) in the lining of the esophagus. Follow these instructions at home: Diet   Follow a diet as told by your doctor. You may need to avoid foods and drinks such as:  Coffee and tea (with or without caffeine).  Drinks that contain alcohol.  Energy drinks and sports drinks.  Carbonated drinks or sodas.  Chocolate and cocoa.  Peppermint and mint flavorings.  Garlic and onions.  Horseradish.  Spicy and acidic foods, such as peppers, chili powder, curry powder, vinegar, hot sauces, and BBQ sauce.  Citrus fruit juices and citrus fruits, such as oranges, lemons, and limes.  Tomato-based foods, such as red sauce, chili, salsa, and pizza with red sauce.  Fried and fatty foods, such as donuts, french fries, potato chips, and high-fat dressings.  High-fat meats, such as hot dogs, rib eye steak, sausage, ham, and bacon.  High-fat dairy items, such as whole milk, butter, and cream cheese.  Eat small meals often. Avoid eating large meals.  Avoid drinking large amounts of  liquid with your meals.  Avoid eating meals during the 2-3 hours before bedtime.  Avoid lying down right after you eat.  Do not exercise right after you eat. General instructions   Pay attention to any changes in your symptoms.  Take over-the-counter and prescription medicines only as told by your doctor. Do not take aspirin, ibuprofen, or other NSAIDs unless your doctor says it is okay.  Do not use any tobacco products, including cigarettes, chewing tobacco, and e-cigarettes. If you need help quitting, ask your doctor.  Wear loose clothes. Do not wear anything tight around your waist.  Raise (elevate) the head of your bed about 6 inches (15 cm).  Try to lower your stress. If you need help doing this, ask your doctor.  If you are overweight, lose an amount of weight that is healthy for you. Ask your doctor about a safe weight loss goal.  Keep all follow-up visits as told by your doctor. This is important. Contact a doctor if:  You have  new symptoms.  You lose weight and you do not know why it is happening.  You have trouble swallowing, or it hurts to swallow.  You have wheezing or a cough that keeps happening.  Your symptoms do not get better with treatment.  You have a hoarse voice. Get help right away if:  You have pain in your arms, neck, jaw, teeth, or back.  You feel sweaty, dizzy, or light-headed.  You have chest pain or shortness of breath.  You throw up (vomit) and your throw up looks like blood or coffee grounds.  You pass out (faint).  Your poop (stool) is bloody or black.  You cannot swallow, drink, or eat. This information is not intended to replace advice given to you by your health care provider. Make sure you discuss any questions you have with your health care provider. Document Released: 09/24/2007 Document Revised: 09/13/2015 Document Reviewed: 08/02/2014 Elsevier Interactive Patient Education  2017 [place].    Colon Polyps Polyps are  tissue growths inside the body. Polyps can grow in many places, including the large intestine (colon). A polyp may be a round bump or a mushroom-shaped growth. You could have one polyp or several. Most colon polyps are noncancerous (benign). However, some colon polyps can become cancerous over time. What are the causes? The exact cause of colon polyps is not known. What increases the risk? This condition is more likely to develop in people who:  Have a family history of colon cancer or colon polyps.  Are older than 32 or older than 45 if they are African American.  Have inflammatory bowel disease, such as ulcerative colitis or Crohn disease.  Are overweight.  Smoke cigarettes.  Do not get enough exercise.  Drink too much alcohol.  Eat a diet that is:  High in fat and red meat.  Low in fiber.  Had childhood cancer that was treated with abdominal radiation. What are the signs or symptoms? Most polyps do not cause symptoms. If you have symptoms, they may include:  Blood coming from your rectum when having a bowel movement.  Blood in your stool.The stool may look dark red or black.  A change in bowel habits, such as constipation or diarrhea. How is this diagnosed? This condition is diagnosed with a colonoscopy. This is a procedure that uses a lighted, flexible scope to look at the inside of your colon. How is this treated? Treatment for this condition involves removing any polyps that are found. Those polyps will then be tested for cancer. If cancer is found, your health care provider will talk to you about options for colon cancer treatment. Follow these instructions at home: Diet   Eat plenty of fiber, such as fruits, vegetables, and whole grains.  Eat foods that are high in calcium and vitamin D, such as milk, cheese, yogurt, eggs, liver, fish, and broccoli.  Limit foods high in fat, red meats, and processed meats, such as hot dogs, sausage, bacon, and lunch  meats.  Maintain a healthy weight, or lose weight if recommended by your health care provider. General instructions   Do not smoke cigarettes.  Do not drink alcohol excessively.  Keep all follow-up visits as told by your health care provider. This is important. This includes keeping regularly scheduled colonoscopies. Talk to your health care provider about when you need a colonoscopy.  Exercise every day or as told by your health care provider. Contact a health care provider if:  You have new or worsening bleeding  during a bowel movement.  You have new or increased blood in your stool.  You have a change in bowel habits.  You unexpectedly lose weight. This information is not intended to replace advice given to you by your health care provider. Make sure you discuss any questions you have with your health care provider. Document Released: 01/02/2004 Document Revised: 09/13/2015 Document Reviewed: 02/26/2015 Elsevier Interactive Patient Education  2017 [employer].

## 2016-07-17 ENCOUNTER — Encounter: Payer: Self-pay | Admitting: Internal Medicine

## 2016-07-21 ENCOUNTER — Encounter (HOSPITAL_COMMUNITY): Payer: Self-pay | Admitting: Internal Medicine

## 2016-08-08 DIAGNOSIS — H6123 Impacted cerumen, bilateral: Secondary | ICD-10-CM | POA: Diagnosis not present

## 2016-08-08 DIAGNOSIS — L299 Pruritus, unspecified: Secondary | ICD-10-CM | POA: Diagnosis not present

## 2016-08-08 DIAGNOSIS — H903 Sensorineural hearing loss, bilateral: Secondary | ICD-10-CM | POA: Diagnosis not present

## 2016-08-29 ENCOUNTER — Encounter: Payer: Self-pay | Admitting: Gastroenterology

## 2016-09-08 DIAGNOSIS — B9681 Helicobacter pylori [H. pylori] as the cause of diseases classified elsewhere: Secondary | ICD-10-CM | POA: Diagnosis not present

## 2016-09-08 DIAGNOSIS — M545 Low back pain: Secondary | ICD-10-CM | POA: Diagnosis not present

## 2016-09-08 DIAGNOSIS — R635 Abnormal weight gain: Secondary | ICD-10-CM | POA: Diagnosis not present

## 2016-09-08 DIAGNOSIS — J449 Chronic obstructive pulmonary disease, unspecified: Secondary | ICD-10-CM | POA: Diagnosis not present

## 2016-09-15 ENCOUNTER — Other Ambulatory Visit: Payer: Self-pay | Admitting: Gastroenterology

## 2016-10-13 DIAGNOSIS — H40013 Open angle with borderline findings, low risk, bilateral: Secondary | ICD-10-CM | POA: Diagnosis not present

## 2016-10-13 DIAGNOSIS — H52223 Regular astigmatism, bilateral: Secondary | ICD-10-CM | POA: Diagnosis not present

## 2016-10-13 DIAGNOSIS — H524 Presbyopia: Secondary | ICD-10-CM | POA: Diagnosis not present

## 2016-10-13 DIAGNOSIS — Z9849 Cataract extraction status, unspecified eye: Secondary | ICD-10-CM | POA: Diagnosis not present

## 2016-10-13 DIAGNOSIS — H5203 Hypermetropia, bilateral: Secondary | ICD-10-CM | POA: Diagnosis not present

## 2016-10-13 DIAGNOSIS — Z961 Presence of intraocular lens: Secondary | ICD-10-CM | POA: Diagnosis not present

## 2016-10-14 ENCOUNTER — Ambulatory Visit: Payer: Medicare HMO | Admitting: Gastroenterology

## 2016-10-15 ENCOUNTER — Encounter: Payer: Self-pay | Admitting: Gastroenterology

## 2016-10-15 ENCOUNTER — Ambulatory Visit (INDEPENDENT_AMBULATORY_CARE_PROVIDER_SITE_OTHER): Payer: Medicare HMO | Admitting: Gastroenterology

## 2016-10-15 VITALS — BP 116/68 | HR 86 | Temp 98.1°F | Ht 64.0 in | Wt 194.2 lb

## 2016-10-15 DIAGNOSIS — R110 Nausea: Secondary | ICD-10-CM

## 2016-10-15 DIAGNOSIS — K3184 Gastroparesis: Secondary | ICD-10-CM

## 2016-10-15 DIAGNOSIS — R1013 Epigastric pain: Secondary | ICD-10-CM | POA: Diagnosis not present

## 2016-10-15 MED ORDER — ERYTHROMYCIN ETHYLSUCCINATE 200 MG/5ML PO SUSR: 125.0000 mg | Freq: Three times a day (TID) | ORAL | 2 refills | Status: DC

## 2016-10-15 NOTE — Progress Notes (Signed)
Primary Care Physician: Sinda Du, MD  Primary Gastroenterologist:  Garfield Cornea, MD   Chief Complaint  Patient presents with  . Nausea  . Abdominal Pain    HPI: Tracey Mccullough is a 71 y.o. female here for procedure follow-up. She was seen in February with GERD and history of colon polyps. She complained of abdominal bloating and noted to have an 18 pound weight gain in the past year. Also complained of frequent reflux on omeprazole 20 mg daily, recurrent dysphagia. H/o fatty liver and borderline delayed GES.   EGD and colonoscopy performed in March. She had a mild Schatzki ring status post dilation. Small hiatal hernia. Gastric erosions. 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 pylori. She had 8 polyps removed from her colon, tubular and no icterus. Diverticulosis noted. Plans for repeat colonoscopy in 3 years.  Dr. Gala Romney increased her omeprazole 40 mg daily. She states her heartburn is better controlled. Swallowing has improved. Continues to have some epigastric discomfort, worse when she presses on it. Sometimes better with eating frozen yogurt. She has lost 8 pounds since we last saw her. She states Dr. Luan Pulling started her on Topamax to help her with weight loss. She's been on it for 4 weeks. 24 hour recall, Carnation instant breakfast with skim milk, string cheese for lunch, yogurt or pudding for snack. Lean cuisine for dinner. Patient states she's really watching her diet trying to lose weight. She joined a gym but has yet to go. She states she just doesn't feel well with the nausea. She denies increased nausea with Topamax. Her nausea has been pretty bad since 4 months ago right before she came to see [place]. No vomiting. She's taking promethazine regularly but really doesn't feel like it helps much. Constipation well controlled with MiraLAX. Occasional Levsin for cramps.   Current Outpatient Prescriptions  Medication Sig Dispense Refill  . albuterol  (PROAIR HFA) 108 (90 Base) MCG/ACT inhaler Inhale 2 puffs into the lungs every 6 (six) hours as needed for wheezing or shortness of breath.    . ALPRAZolam (XANAX) 1 MG tablet Take 1 mg by mouth 3 (three) times daily as needed for anxiety.    Marland Kitchen amLODipine-benazepril (LOTREL) 10-20 MG per capsule Take 1 capsule by mouth daily before breakfast.     . cholecalciferol (VITAMIN D) 1000 units tablet Take 1,000 Units by mouth at bedtime.    . clopidogrel (PLAVIX) 75 MG tablet Take 75 mg by mouth at bedtime.     Marland Kitchen CREON 12000 units CPEP capsule Take 12,000 Units by mouth 3 (three) times daily before meals.     Marland Kitchen HYDROcodone-acetaminophen (NORCO) 10-325 MG per tablet Take 1 tablet by mouth every 6 (six) hours as needed (for pain.).     Marland Kitchen hyoscyamine (LEVSIN SL) 0.125 MG SL tablet DISSOLVE 1 TABLET UNDER THE TONGUE EVERY 4 HOURS AS NEEDED FOR CRAMPING 90 tablet 1  . levothyroxine (SYNTHROID, LEVOTHROID) 75 MCG tablet Take 75 mcg by mouth daily before breakfast.    . Multiple Vitamin (MULTIVITAMIN WITH MINERALS) TABS tablet Take 1 tablet by mouth daily.    Marland Kitchen omeprazole (PRILOSEC) 40 MG capsule Take 40 mg by mouth daily.    . potassium chloride (K-DUR) 10 MEQ tablet Take 10 mEq by mouth daily before breakfast.    . pravastatin (PRAVACHOL) 20 MG tablet Take 20 mg by mouth daily before breakfast.     . promethazine (PHENERGAN) 25 MG tablet Take 25 mg  by mouth every 4 (four) hours as needed for nausea.     Marland Kitchen rOPINIRole (REQUIP) 1 MG tablet Take 1 mg by mouth at bedtime.      . topiramate (TOPAMAX) 25 MG tablet     . torsemide (DEMADEX) 20 MG tablet Take 20 mg by mouth daily before breakfast.     No current facility-administered medications for this visit.     Allergies as of 10/15/2016 - Review Complete 10/15/2016  Allergen Reaction Noted  . Metoclopramide hcl Other (See Comments)   . Penicillins Anaphylaxis   . Zofran Other (See Comments) 11/26/2010    ROS:  General: Negative for anorexia,  Unintentional weight loss, fever, chills, fatigue, weakness. ENT: Negative for hoarseness, difficulty swallowing , nasal congestion. CV: Negative for chest pain, angina, palpitations, dyspnea on exertion, peripheral edema.  Respiratory: Negative for dyspnea at rest, dyspnea on exertion, cough, sputum, wheezing.  GI: See history of present illness. GU:  Negative for dysuria, hematuria, urinary incontinence, urinary frequency, nocturnal urination.  Endo: Negative for unusual weight change.    Physical Examination:   BP 116/68   Pulse 86   Temp 98.1 F (36.7 C) (Oral)   Ht 5\' 4"  (1.626 m)   Wt 194 lb 3.2 oz (88.1 kg)   BMI 33.33 kg/m   General: Well-nourished, well-developed in no acute distress. Accompanied by spouse Eyes: No icterus. Mouth: Oropharyngeal mucosa moist and pink , no lesions erythema or exudate. Lungs: Clear to auscultation bilaterally.  Heart: Regular rate and rhythm, no murmurs rubs or gallops.  Abdomen: Bowel sounds are normal, mild epigastric tenderness, nondistended, no hepatosplenomegaly or masses, no abdominal bruits or hernia , no rebound or guarding.   Extremities: No lower extremity edema. No clubbing or deformities. Neuro: Alert and oriented x 4   Skin: Warm and dry, no jaundice.   Psych: Alert and cooperative, normal mood and affect.  Labs:  Lab Results  Component Value Date   ALT 50 04/03/2016   AST 59 (H) 04/03/2016   ALKPHOS 98 04/03/2016   BILITOT 0.3 04/03/2016   Lab Results  Component Value Date   CREATININE 0.75 07/08/2016   BUN 12 07/08/2016   NA 136 07/08/2016   K 3.6 07/08/2016   CL 101 07/08/2016   CO2 25 07/08/2016   Lab Results  Component Value Date   WBC 7.0 07/08/2016   HGB 14.7 07/08/2016   HCT 41.8 07/08/2016   MCV 86.7 07/08/2016   PLT 197 07/08/2016    Imaging Studies: No results found.

## 2016-10-15 NOTE — Patient Instructions (Signed)
1. Start erythromycin ethylsuccinate 125mg  by mouth 30 minutes before meals and at bedtime to prevent nausea. RX sent to your pharmacy.  2. Use Phenergan only as needed.  3. Continue omeprazole 40mg  daily.  4. Call in two weeks and let me know how you are feeling with regards to the nausea.  5. Return to the office in six months.

## 2016-10-15 NOTE — Assessment & Plan Note (Signed)
EGD up-to-date. She had some gastric erosions. Omeprazole was increased to 40 mg daily. Avoid NSAIDs. Avoid acidic foods. Treatment of gastroparesis. PR in 2 weeks.

## 2016-10-15 NOTE — Assessment & Plan Note (Signed)
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 She is on chronic narcotics which are impeding gastric emptying. 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 She'll continue omeprazole 40 mg daily as it seems to be helping her reflux quite a bit. Discussed the patient with Topamax may be exacerbating her nausea although she feels like her symptoms are unchanged since she started it 1 month ago. Patient is already following a gastroparesis diet essentially. We will add EES (erythromycin ethylsuccinate for its prokinetic benefits, 125 mg 30 mins before meals and at bedtime). She will use Phenergan only as needed. She'll call in 2 weeks with a per support, could consider increasing dose at that time if she's noted some improvement but not resolution of her symptoms. Otherwise we'll see her back in 6 months.

## 2016-10-16 ENCOUNTER — Telehealth: Payer: Self-pay | Admitting: Internal Medicine

## 2016-10-16 NOTE — Telephone Encounter (Signed)
[phone]  PLEASE CALL PATIENT, PRESCRIPTION SHE WAS GIVEN YESTERDAY IS TOO COSTLY EVEN WITH HER INSURANCE AND WANTS TO DISCUSS TRYING SOMETHING ELSE.

## 2016-10-16 NOTE — Progress Notes (Signed)
cc'd to pcp 

## 2016-10-16 NOTE — Telephone Encounter (Signed)
Spoke with the pt, she said the EES was $100.00 after her insurance paid. Pt said she would like to try zofran again and see if she can take it. She recalls the only time she tried it was when she went to the ED and her potassium was low and she feels like the trembling was d/t the low potassium and not the zofran. She said she would like to "take a chance with it" and see what happens if that is ok with LSL.

## 2016-10-17 NOTE — Telephone Encounter (Signed)
Reviewed patient's records from 2012 when zofran entered as allergy with severe cramping and trembling.   Notably patient tolerated zofran in 02/2010 without problems.   When she presented to ed in early July with n/v she had severe hypokalemia 2.5. Was given zofran again. Came back to ED five days later with persistent n/v, "tremors" of lower extremities. At that time her potassium was <2.   I suspect "tremors" related to severe hypokalemia rather than reaction to zofran.   DR. Oneida Alar, DO YOU AGREE WITH TRIAL OF ZOFRAN AGAIN AS IT APPEARS SYMPTOMS BEFORE LIKELY DUE TO SEVERE HYPOKALEMIA?

## 2016-10-17 NOTE — Telephone Encounter (Signed)
WAIT FOR DR. Gala Romney TO WEIGH IN. PT SHOULD FOLLOW A GASTROPARESIS DIET. SHE CAN GO TO [email or web address] OR COME BY THE OFFICE TO PICK UP A HANDOUT.

## 2016-10-20 ENCOUNTER — Telehealth: Payer: Self-pay

## 2016-10-20 NOTE — Telephone Encounter (Signed)
Pt is aware. She was just seen last week and said she will call if she doesn't get any better. She has a copy of the gastroparesis diet and will follow that for now. She said she was doing ok at the moment.

## 2016-10-20 NOTE — Telephone Encounter (Signed)
Stay away from Zofran. Focus on a gastroparesis diet. Office visit with extender in coming weeks.

## 2016-10-20 NOTE — Telephone Encounter (Signed)
Routing to RMR- please see LSL note.

## 2016-10-20 NOTE — Telephone Encounter (Signed)
Received fax from [facility] re: Erythromycin rx. Patient requests new rx: this medication is expensive even after insurance, pt was wondering if we could get something different that might be cheaper for her. Routing to LSL.

## 2016-10-21 NOTE — Telephone Encounter (Signed)
See RMR instructions.

## 2016-10-21 NOTE — Telephone Encounter (Signed)
Addressed under a different telephone encounter.

## 2016-10-21 NOTE — Telephone Encounter (Signed)
Noted  

## 2016-10-26 NOTE — Progress Notes (Signed)
REVIEWED-NO ADDITIONAL RECOMMENDATIONS. 

## 2016-11-10 DIAGNOSIS — I10 Essential (primary) hypertension: Secondary | ICD-10-CM | POA: Diagnosis not present

## 2016-11-10 DIAGNOSIS — K3184 Gastroparesis: Secondary | ICD-10-CM | POA: Diagnosis not present

## 2016-11-10 DIAGNOSIS — J449 Chronic obstructive pulmonary disease, unspecified: Secondary | ICD-10-CM | POA: Diagnosis not present

## 2016-11-10 DIAGNOSIS — G2581 Restless legs syndrome: Secondary | ICD-10-CM | POA: Diagnosis not present

## 2016-12-08 DIAGNOSIS — H539 Unspecified visual disturbance: Secondary | ICD-10-CM | POA: Diagnosis not present

## 2016-12-29 DIAGNOSIS — Z0000 Encounter for general adult medical examination without abnormal findings: Secondary | ICD-10-CM | POA: Diagnosis not present

## 2016-12-30 DIAGNOSIS — I6350 Cerebral infarction due to unspecified occlusion or stenosis of unspecified cerebral artery: Secondary | ICD-10-CM | POA: Diagnosis not present

## 2016-12-30 DIAGNOSIS — R635 Abnormal weight gain: Secondary | ICD-10-CM | POA: Diagnosis not present

## 2016-12-30 DIAGNOSIS — R609 Edema, unspecified: Secondary | ICD-10-CM | POA: Diagnosis not present

## 2016-12-30 DIAGNOSIS — K210 Gastro-esophageal reflux disease with esophagitis: Secondary | ICD-10-CM | POA: Diagnosis not present

## 2016-12-30 DIAGNOSIS — M129 Arthropathy, unspecified: Secondary | ICD-10-CM | POA: Diagnosis not present

## 2016-12-30 DIAGNOSIS — R739 Hyperglycemia, unspecified: Secondary | ICD-10-CM | POA: Diagnosis not present

## 2016-12-30 DIAGNOSIS — M810 Age-related osteoporosis without current pathological fracture: Secondary | ICD-10-CM | POA: Diagnosis not present

## 2016-12-30 DIAGNOSIS — Z0000 Encounter for general adult medical examination without abnormal findings: Secondary | ICD-10-CM | POA: Diagnosis not present

## 2016-12-30 DIAGNOSIS — K3184 Gastroparesis: Secondary | ICD-10-CM | POA: Diagnosis not present

## 2016-12-30 DIAGNOSIS — I10 Essential (primary) hypertension: Secondary | ICD-10-CM | POA: Diagnosis not present

## 2017-01-22 ENCOUNTER — Other Ambulatory Visit: Payer: Self-pay | Admitting: Internal Medicine

## 2017-02-02 ENCOUNTER — Other Ambulatory Visit: Payer: Self-pay | Admitting: Internal Medicine

## 2017-03-01 ENCOUNTER — Emergency Department (HOSPITAL_COMMUNITY)
Admission: EM | Admit: 2017-03-01 | Discharge: 2017-03-01 | Disposition: A | Discharge status: Home / Self Care | Payer: Medicare HMO | Attending: Emergency Medicine | Admitting: Emergency Medicine

## 2017-03-01 ENCOUNTER — Encounter (HOSPITAL_COMMUNITY): Payer: Self-pay | Admitting: *Deleted

## 2017-03-01 DIAGNOSIS — Z8673 Personal history of transient ischemic attack (TIA), and cerebral infarction without residual deficits: Secondary | ICD-10-CM | POA: Diagnosis not present

## 2017-03-01 DIAGNOSIS — R197 Diarrhea, unspecified: Secondary | ICD-10-CM | POA: Diagnosis not present

## 2017-03-01 DIAGNOSIS — Z7902 Long term (current) use of antithrombotics/antiplatelets: Secondary | ICD-10-CM | POA: Insufficient documentation

## 2017-03-01 DIAGNOSIS — E039 Hypothyroidism, unspecified: Secondary | ICD-10-CM | POA: Insufficient documentation

## 2017-03-01 DIAGNOSIS — F17210 Nicotine dependence, cigarettes, uncomplicated: Secondary | ICD-10-CM | POA: Insufficient documentation

## 2017-03-01 DIAGNOSIS — Z79899 Other long term (current) drug therapy: Secondary | ICD-10-CM | POA: Insufficient documentation

## 2017-03-01 DIAGNOSIS — I10 Essential (primary) hypertension: Secondary | ICD-10-CM | POA: Diagnosis not present

## 2017-03-01 DIAGNOSIS — R69 Illness, unspecified: Secondary | ICD-10-CM | POA: Diagnosis not present

## 2017-03-01 DIAGNOSIS — J449 Chronic obstructive pulmonary disease, unspecified: Secondary | ICD-10-CM | POA: Insufficient documentation

## 2017-03-01 DIAGNOSIS — R112 Nausea with vomiting, unspecified: Secondary | ICD-10-CM | POA: Diagnosis not present

## 2017-03-01 DIAGNOSIS — R6883 Chills (without fever): Secondary | ICD-10-CM | POA: Diagnosis not present

## 2017-03-01 DIAGNOSIS — R1110 Vomiting, unspecified: Secondary | ICD-10-CM | POA: Insufficient documentation

## 2017-03-01 LAB — URINALYSIS, ROUTINE W REFLEX MICROSCOPIC
Bilirubin Urine: NEGATIVE
Glucose, UA: NEGATIVE mg/dL
Hgb urine dipstick: NEGATIVE
KETONES UR: NEGATIVE mg/dL
LEUKOCYTES UA: NEGATIVE
NITRITE: NEGATIVE
PH: 7 (ref 5.0–8.0)
PROTEIN: 30 mg/dL — AB
Specific Gravity, Urine: 1.008 (ref 1.005–1.030)

## 2017-03-01 LAB — COMPREHENSIVE METABOLIC PANEL
ALK PHOS: 116 U/L (ref 38–126)
ALT: 61 U/L — AB (ref 14–54)
AST: 83 U/L — AB (ref 15–41)
Albumin: 4.7 g/dL (ref 3.5–5.0)
Anion gap: 12 (ref 5–15)
BUN: 12 mg/dL (ref 6–20)
CALCIUM: 9.8 mg/dL (ref 8.9–10.3)
CO2: 26 mmol/L (ref 22–32)
Chloride: 99 mmol/L — ABNORMAL LOW (ref 101–111)
Creatinine, Ser: 0.75 mg/dL (ref 0.44–1.00)
GFR calc Af Amer: >60 mL/min (ref >60)
GFR calc non Af Amer: >60 mL/min (ref >60)
GLUCOSE: 119 mg/dL — AB (ref 65–99)
Potassium: 3.8 mmol/L (ref 3.5–5.1)
Sodium: 137 mmol/L (ref 135–145)
Total Bilirubin: 0.6 mg/dL (ref 0.3–1.2)
Total Protein: 8.1 g/dL (ref 6.5–8.1)

## 2017-03-01 LAB — CBC WITH DIFFERENTIAL/PLATELET
BASOS PCT: 0 %
Basophils Absolute: 0 10*3/uL (ref 0.0–0.1)
EOS ABS: 0.1 10*3/uL (ref 0.0–0.7)
Eosinophils Relative: 1 %
HEMATOCRIT: 45.1 % (ref 36.0–46.0)
HEMOGLOBIN: 15.9 g/dL — AB (ref 12.0–15.0)
LYMPHS ABS: 1.5 10*3/uL (ref 0.7–4.0)
Lymphocytes Relative: 14 %
MCH: 31.5 pg (ref 26.0–34.0)
MCHC: 35.3 g/dL (ref 30.0–36.0)
MCV: 89.5 fL (ref 78.0–100.0)
MONOS PCT: 6 %
Monocytes Absolute: 0.6 10*3/uL (ref 0.1–1.0)
NEUTROS ABS: 8.9 10*3/uL — AB (ref 1.7–7.7)
NEUTROS PCT: 79 %
Platelets: 254 10*3/uL (ref 150–400)
RBC: 5.04 MIL/uL (ref 3.87–5.11)
RDW: 14 % (ref 11.5–15.5)
WBC: 11.1 10*3/uL — AB (ref 4.0–10.5)

## 2017-03-01 LAB — LIPASE, BLOOD: Lipase: 44 U/L (ref 11–51)

## 2017-03-01 MED ORDER — PROMETHAZINE HCL 25 MG/ML IJ SOLN
12.5000 mg | Freq: Once | INTRAMUSCULAR | Status: AC
  Administered 2017-03-01: 12.5 mg via INTRAVENOUS
  Filled 2017-03-01: qty 1

## 2017-03-01 MED ORDER — PROMETHAZINE HCL 25 MG RE SUPP: 25.0000 mg | Freq: Four times a day (QID) | RECTAL | 0 refills | Status: DC | PRN

## 2017-03-01 MED ORDER — SODIUM CHLORIDE 0.9 % IV BOLUS (SEPSIS)
1000.0000 mL | Freq: Once | INTRAVENOUS | Status: AC
  Administered 2017-03-01: 1000 mL via INTRAVENOUS

## 2017-03-01 NOTE — ED Triage Notes (Signed)
Pt comes in with n/v/d over the last 3 days. She has hx of gastroparesis and states this feels the same.

## 2017-03-01 NOTE — ED Provider Notes (Signed)
[facility] EMERGENCY DEPARTMENT Provider Note   CSN: [id number] Arrival date & time: 03/01/17  0809     History   Chief Complaint Chief Complaint  Patient presents with  . Emesis    HPI Tracey Mccullough is a 71 y.o. female.   Emesis   This is a recurrent problem. The current episode started 2 days ago. The problem occurs 2 to 4 times per day. The problem has been gradually worsening. There has been no fever. Associated symptoms include chills and diarrhea. Pertinent negatives include no abdominal pain, no arthralgias, no cough, no fever and no headaches.    Past Medical History:  Diagnosis Date  . Acid reflux   . Anxiety   . Arthritis   . COPD (chronic obstructive pulmonary disease) ([place])   . Depression   . Gastroparesis    INTOLERANT TO REGLAN; On marinol  . GERD (gastroesophageal reflux disease)   . Hemorrhoids 09/01/2011   external  . History of kidney stones   . Hypercholesterolemia   . Hypertension   . Hypokalemia 11/01/10   requiring admission  . Hypothyroidism   . Irritable bowel syndrome (IBS)   . Restless leg syndrome   . Schatzki's ring    EGD 1/12  . Stroke ([place]) 2001   No deficits  . Thyroid disease     Patient Active Problem List   Diagnosis Date Noted  . Chest pain 04/03/2016  . Abnormal LFTs 08/24/2012  . Leukocytosis, unspecified 08/24/2012  . Dysphagia 08/11/2011  . Headache(784.0) 04/03/2011  . Fatty liver 04/03/2011  . Family history of colon cancer 04/03/2011  . COPD (chronic obstructive pulmonary disease) ([place]) 11/01/2010  . CVA (cerebral vascular accident) ([place]) 11/01/2010  . Abdominal pain, epigastric 09/19/2009  . Constipation 08/04/2008  . HEMORRHOIDS, EXTERNAL 12/09/2007  . GERD, SEVERE 12/09/2007  . Gastroparesis 12/09/2007  . HIATAL HERNIA 12/09/2007  . Other fatigue 12/09/2007  . Nausea without vomiting 12/09/2007  . ABDOMINAL BLOATING 12/09/2007    Past Surgical History:  Procedure Laterality Date  . CARPAL  TUNNEL RELEASE     bilateral  . CHOLECYSTECTOMY  1978  . COLONOSCOPY  09/2006   hemorrhoids  . COLONOSCOPY  09/01/2011   Dr. Camillia Herter hemorrhoids, tubular adenoma. next tcs 08/2016  . ESOPHAGOGASTRODUODENOSCOPY  04/2010   Schatzki ring s/p dilation, minimal retained food. Versed 7/Demeral 150/Phenergan12.5.  . ESOPHAGOGASTRODUODENOSCOPY  09/01/2011   Dr. Leodis Binet esophageal web and schatzki's ring, small hiatal hernia, antral erosions-bx-mild chronic inflammation. circumferential distal esophageal erosions  . excision of axilliary sweat glands Bilateral   . FRACTURE SURGERY  2011   right arm  . KIDNEY STONE SURGERY  2008  . TUBAL LIGATION  1972    OB History    No data available       Home Medications    Prior to Admission medications   Medication Sig Start Date End Date Taking? Authorizing Provider  albuterol (PROAIR HFA) 108 (90 Base) MCG/ACT inhaler Inhale 2 puffs into the lungs every 6 (six) hours as needed for wheezing or shortness of breath.   Yes [provider]  ALPRAZolam Duanne Moron) 1 MG tablet Take 1 mg by mouth 3 (three) times daily as needed for anxiety.   Yes [provider]  amLODipine-benazepril (LOTREL) 10-20 MG per capsule Take 1 capsule by mouth daily before breakfast.    Yes [provider]  cholecalciferol (VITAMIN D) 1000 units tablet Take 1,000 Units by mouth at bedtime.   Yes [provider]  clopidogrel (  PLAVIX) 75 MG tablet Take 75 mg by mouth at bedtime.    Yes [provider]  HYDROcodone-acetaminophen (NORCO) 10-325 MG per tablet Take 1 tablet by mouth every 6 (six) hours as needed (for pain.).  12/08/13  Yes [provider]  hyoscyamine (LEVSIN SL) 0.125 MG SL tablet DISSOLVE 1 TABLET UNDER THE TONGUE EVERY 4 HOURS AS NEEDED FOR CRAMPING 09/16/16  Yes Carlis Stable, NP  levothyroxine (SYNTHROID, LEVOTHROID) 75 MCG tablet Take 75 mcg by mouth daily before breakfast.   Yes [provider]    Multiple Vitamin (MULTIVITAMIN WITH MINERALS) TABS tablet Take 1 tablet by mouth daily.   Yes [provider]  omeprazole (PRILOSEC) 40 MG capsule TAKE ONE CAPSULE BY MOUTH EVERY DAY 02/03/17  Yes Mahala Menghini, PA-C  potassium chloride (K-DUR) 10 MEQ tablet Take 10 mEq by mouth daily before breakfast.   Yes [provider]  pravastatin (PRAVACHOL) 20 MG tablet Take 20 mg by mouth daily before breakfast.    Yes [provider]  promethazine (PHENERGAN) 25 MG tablet Take 25 mg by mouth every 4 (four) hours as needed for nausea.  12/08/13  Yes [provider]  rOPINIRole (REQUIP) 1 MG tablet Take 1 mg at bedtime by mouth. 02/19/17  Yes [provider]  topiramate (TOPAMAX) 25 MG tablet  09/08/16  Yes [provider]  torsemide (DEMADEX) 20 MG tablet Take 20 mg by mouth daily before breakfast.   Yes [provider]  CREON 12000 units CPEP capsule Take 12,000 Units by mouth 3 (three) times daily before meals.  09/16/16   [provider]  promethazine (PHENERGAN) 25 MG suppository Place 1 suppository (25 mg total) every 6 (six) hours as needed rectally for nausea or vomiting. 03/01/17   Keawe Marcello, Corene Cornea, MD    Family History Family History  Problem Relation Age of Onset  . Cancer Father        ?leukemia  . Cancer Sister   . Colon cancer Brother 73  . Colon cancer Sister 19       recently diagnosed    Social History Social History   Tobacco Use  . Smoking status: Current Every Day Smoker    Packs/day: 1.75    Years: 53.00    Pack years: 92.75    Types: Cigarettes  . Smokeless tobacco: Never Used  Substance Use Topics  . Alcohol use: No  . Drug use: No     Allergies   Metoclopramide hcl; Penicillins; and Zofran   Review of Systems Review of Systems  Constitutional: Positive for chills. Negative for fever.  Respiratory: Negative for cough.   Gastrointestinal: Positive for diarrhea and vomiting. Negative for  abdominal pain.  Musculoskeletal: Negative for arthralgias.  Neurological: Negative for headaches.  All other systems reviewed and are negative.    Physical Exam Updated Vital Signs BP (!) 144/98 (BP Location: Right Arm)   Pulse 92   Temp 98.6 F (37 C) (Oral)   Resp 19   Ht 5\' 4"  (1.626 m)   Wt 88 kg (194 lb)   SpO2 98%   BMI 33.30 kg/m   Physical Exam  Constitutional: She appears well-developed and well-nourished.  HENT:  Head: Normocephalic and atraumatic.  Eyes: Conjunctivae and EOM are normal.  Neck: Normal range of motion.  Cardiovascular: Normal rate and regular rhythm.  Pulmonary/Chest: No stridor. No respiratory distress.  Abdominal: Soft. She exhibits no distension. There is no tenderness. There is no guarding.  Neurological: She  is alert.  Skin: Skin is warm and dry. No erythema. No pallor.  Nursing note and vitals reviewed.    ED Treatments / Results  Labs (all labs ordered are listed, but only abnormal results are displayed) Labs Reviewed  CBC WITH DIFFERENTIAL/PLATELET - Abnormal; Notable for the following components:      Result Value   WBC 11.1 (*)    Hemoglobin 15.9 (*)    Neutro Abs 8.9 (*)    All other components within normal limits  COMPREHENSIVE METABOLIC PANEL - Abnormal; Notable for the following components:   Chloride 99 (*)    Glucose, Bld 119 (*)    AST 83 (*)    ALT 61 (*)    All other components within normal limits  URINALYSIS, ROUTINE W REFLEX MICROSCOPIC - Abnormal; Notable for the following components:   Protein, ur 30 (*)    Bacteria, UA FEW (*)    Squamous Epithelial / LPF 0-5 (*)    All other components within normal limits  LIPASE, BLOOD    EKG  EKG Interpretation  Date/Time:  Sunday March 01 2017 09:14:24 EST Ventricular Rate:  87 PR Interval:    QRS Duration: 89 QT Interval:  382 QTC Calculation: 460 R Axis:   76 Text Interpretation:  Sinus rhythm No significant change since last tracing Confirmed by  Bruin Bolger ([id number]) on 03/01/2017 9:43:21 AM       Radiology No results found.  Procedures Procedures (including critical care time)  Medications Ordered in ED Medications  sodium chloride 0.9 % bolus 1,000 mL (0 mLs Intravenous Stopped 03/01/17 1211)  promethazine (PHENERGAN) injection 12.5 mg (12.5 mg Intravenous Given 03/01/17 1025)     Initial Impression / Assessment and Plan / ED Course  I have reviewed the triage vital signs and the nursing notes.  Pertinent labs & imaging results that were available during my care of the patient were reviewed by me and considered in my medical decision making (see chart for details).  Patient symptoms significant improved while in the emergency department after IV fluids and IV antiemetics.  Abdominal exams are unremarkable no evidence of peritonitis.  Labs not concerning for dehydration or severe illness.  No indication for imaging at this time.   Patient tolerating p.o. without difficulty she still does not have an appetite still does not feel great but feels much improved so will be discharged on Phenergan suppositories as needed if she is not able to take the p.o. once.  Otherwise patient stable for discharge PCP follow-up otherwise can return here for new or worsening symptoms.  Final Clinical Impressions(s) / ED Diagnoses   Final diagnoses:  Vomiting, intractability of vomiting not specified, presence of nausea not specified, unspecified vomiting type    ED Discharge Orders        Ordered    promethazine (PHENERGAN) 25 MG suppository  Every 6 hours PRN     11 /11/18 1350       Steed Kanaan, Corene Cornea, MD 03/01/17 1546

## 2017-03-04 ENCOUNTER — Telehealth: Payer: Self-pay | Admitting: Internal Medicine

## 2017-03-04 NOTE — Telephone Encounter (Signed)
Pt is scheduled for in office with LSL tomorrow 03/05/17. I explained the importance on going to the ED if symptoms continue/worsen. Pt continues to have nausea, vomiting and dizziness. Pts spouse had to help her in the bathroom this afternoon. PT IS AWARE THAT SHE SHOULD GO TO THE ED bu Pt said she might wait to come in office as scheduled tomorrow 02/23/17

## 2017-03-04 NOTE — Telephone Encounter (Signed)
Spoke with pt, c/o severe vomiting, diarrhea and nausea x 1 week. Pt went to the ER and feels like she's getting worse. Pts last meal was 4 days ago and it was only soup. Pt was given iv fluids, lab work was done and pt states she was given a shot. Pt isn't able to sleep and when she applies the suppositories, it helps for up to 2 hrs and vomiting starts back. Pt said she is vomiting only bile now. Pt was offered apt tomorrow with LL but would like recommendations prior to that appointment.  Pt was advised to go to the ED if she feels worse. Please advise/

## 2017-03-04 NOTE — Telephone Encounter (Signed)
Pt said that she is weak from being nauseated and vomiting x 1 week, phenergan isn't helping her and she was bloated. She went to the ER last Sunday but isn't any better. She wants to speak with the nurse first to see what RMR recommends or if he could call something in for her. I told her I have an opening with LSL tomorrow if she wanted to come in, but she wants to speak to the nurse first. Please call her at [phone] (let me know if she decides to keep OV)

## 2017-03-04 NOTE — Telephone Encounter (Signed)
Patient last seen five months ago.  If she is unable to keep liquids down or is unable to urinate, then she needs to go back to ER. Would not try solid food until vomiting stops.   If she chooses not to go to ER, then she needs to be seen tomorrow.

## 2017-03-05 ENCOUNTER — Ambulatory Visit: Payer: Medicare HMO | Admitting: Gastroenterology

## 2017-03-05 ENCOUNTER — Encounter: Payer: Self-pay | Admitting: Gastroenterology

## 2017-03-05 VITALS — BP 118/70 | HR 112 | Temp 97.2°F | Ht 63.0 in | Wt 189.2 lb

## 2017-03-05 DIAGNOSIS — R42 Dizziness and giddiness: Secondary | ICD-10-CM | POA: Diagnosis not present

## 2017-03-05 DIAGNOSIS — R112 Nausea with vomiting, unspecified: Secondary | ICD-10-CM | POA: Insufficient documentation

## 2017-03-05 MED ORDER — PROMETHAZINE HCL 25 MG RE SUPP: 25.0000 mg | Freq: Four times a day (QID) | RECTAL | 0 refills | Status: DC | PRN

## 2017-03-05 MED ORDER — PROCHLORPERAZINE MALEATE 5 MG PO TABS: 5.0000 mg | ORAL_TABLET | Freq: Three times a day (TID) | ORAL | 0 refills | Status: DC | PRN

## 2017-03-05 NOTE — Patient Instructions (Addendum)
1. You may take EITHER phenergan tablets OR suppositories, no more than total of 25mg  of phenergan every four hours.  2. If you need something extra to control your symptoms, you can ADD compazine 5mg  up to three times per day. 3. You should consider adding back your Xanax and hydrocodone at least 50% of the dose you were originally taking as you may be having some of your symptoms secondary to withdrawal. Please discuss appropriate way to taper this medications with prescribing provider if you choose to come off them. 4. YOU NEED TO DRINK ONE TABLESPOON OF LIQUID EVERY 15 MINUTES TO STAY HYDRATED. IF YOU CANNOT KEEP YOUR URINE LIGHT YELLOW, YOUR LIGHTHEADEDNESS WORSENS, YOU CONTINUE VOMITING, YOU SHOULD GO BACK TO ER. 5. Please have your labs done today.

## 2017-03-05 NOTE — Progress Notes (Signed)
Primary Care Physician: Sinda Du, MD  Primary Gastroenterologist:  Garfield Cornea, MD   Chief Complaint  Patient presents with  . Nausea    x 1 1/2 weeks; went to ER 03/01/17  . Emesis  . Diarrhea  . Dizziness  . Weakness    HPI: Tracey Mccullough is a 71 y.o. female here for urgent office visit for vomiting, diarrhea, weakness. She was last seen in the office back in June.  She has a history of GERD, colon polyps.  History of abdominal l bloating and an 18 pound weight gain in 1 years time, noted earlier this year.  Has a history of fatty liver and borderline delayed gastric emptying study.  EGD and colonoscopy performed in March.She had a mild Schatzki ring status post dilation. Small hiatal hernia. Gastric erosions. 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 pylori. She had 8 polyps removed from her colon, tubular and no icterus. Diverticulosis noted. Plans for repeat colonoscopy in 3 years.  When I last saw her she reported being on Topamax by Dr. Luan Pulling to help with weight loss.  She lost 8 pounds.  She was actively trying to lose weight.  She was complaining of nausea and taking promethazine regularly.  Constipation well controlled on MiraLAX.  We were planning to add EES 125 mg before meals and at bedtime but she could not afford the medication.  Patient seen in the ED on November 11 with complaints of vomiting for 2 days.  Labs showed AST of 83, ALT 61, white blood cell count 11,100, hemoglobin 15.9.  Otherwise labs unremarkable.  Patient states her symptoms started about 1 week ago. Tried to follow gastroparesis diet but could not tolerate soups. Couldn't keep liquids down. Vomiting everything and then started with dry heaves. Also associated with diarrhea but states that has resolved.  Went to ED. Dizziness and weak. Added phenergan suppositories. Better for less than a day. Husband states she has been getting up every two hours throwing up. Last  night 3am woke up real nauseated. States last episode of vomiting 24 hours ago but very nauseated and hasn't tried to eat. Has had few ounces of coke this morning.   Denies ill contact or antibiotics. States she since last year she came off Topamax and digestive advantage.  No longer on Creon, possibly prescribed by PCP.  She also tells me that she decided that she did not want to be on Xanax and cut back on her pain medication.  Approximately 2 weeks ago she stopped Xanax abruptly, had been on 1 mg dose 2-3 times daily.  She has been off and on it for years but recently on it for a couple of months at least.  Stopped hydrocodone 3-4 days ago, had been taking that 2-3 times daily.  Had been on hydrocodone for years.  Complains of feeling jittery, weak, lightheaded.  States her urine is still light yellow.  Does not want to go back to the emergency department if possible.  Also admits that she was taking up to 50 mg of Phenergan at a time, taking both pill and suppository at the same time.      Current Outpatient Medications  Medication Sig Dispense Refill  . albuterol (PROAIR HFA) 108 (90 Base) MCG/ACT inhaler Inhale 2 puffs into the lungs every 6 (six) hours as needed for wheezing or shortness of breath.    Marland Kitchen amLODipine-benazepril (LOTREL) 10-20 MG per capsule Take 1 capsule  by mouth daily before breakfast.     . cholecalciferol (VITAMIN D) 1000 units tablet Take 1,000 Units by mouth at bedtime.    . clopidogrel (PLAVIX) 75 MG tablet Take 75 mg by mouth at bedtime.     Marland Kitchen HYDROcodone-acetaminophen (NORCO) 10-325 MG per tablet Take 1 tablet by mouth every 6 (six) hours as needed (for pain.).     Marland Kitchen hyoscyamine (LEVSIN SL) 0.125 MG SL tablet DISSOLVE 1 TABLET UNDER THE TONGUE EVERY 4 HOURS AS NEEDED FOR CRAMPING 90 tablet 1  . levothyroxine (SYNTHROID, LEVOTHROID) 75 MCG tablet Take 75 mcg by mouth daily before breakfast.    . Multiple Vitamin (MULTIVITAMIN WITH MINERALS) TABS tablet Take 1 tablet by  mouth daily.    Marland Kitchen omeprazole (PRILOSEC) 40 MG capsule TAKE ONE CAPSULE BY MOUTH EVERY DAY 30 capsule 11  . potassium chloride (K-DUR) 10 MEQ tablet Take 10 mEq by mouth daily before breakfast.    . pravastatin (PRAVACHOL) 20 MG tablet Take 20 mg by mouth daily before breakfast.     . promethazine (PHENERGAN) 25 MG suppository Place 1 suppository (25 mg total) every 6 (six) hours as needed rectally for nausea or vomiting. 12 suppository 0  . promethazine (PHENERGAN) 25 MG tablet Take 25 mg by mouth every 4 (four) hours as needed for nausea.     Marland Kitchen rOPINIRole (REQUIP) 1 MG tablet Take 1 mg at bedtime by mouth.  12  . torsemide (DEMADEX) 20 MG tablet Take 20 mg by mouth daily before breakfast.     No current facility-administered medications for this visit.     Allergies as of 03/05/2017 - Review Complete 03/05/2017  Allergen Reaction Noted  . Metoclopramide hcl Other (See Comments)   . Penicillins Anaphylaxis   . Zofran Other (See Comments) 11/26/2010    ROS:  Generalsee hpi ENT: Negative for hoarseness, difficulty swallowing , nasal congestion. CV: Negative for chest pain, angina, palpitations, dyspnea on exertion, peripheral edema.  Respiratory: Negative for dyspnea at rest, dyspnea on exertion, cough, sputum, wheezing.  GI: See history of present illness. GU:  Negative for dysuria, hematuria, urinary incontinence, urinary frequency, nocturnal urination.  Endo: Negative for unusual weight change.    Physical Examination:   BP 123/77   Pulse (!) 102   Temp (!) 97.2 F (36.2 C) (Oral)   Ht 5\' 3"  (1.6 m)   Wt 189 lb 3.2 oz (85.8 kg)   BMI 33.52 kg/m   General: Well-nourished, well-developed in no acute distress.  Does not appear acutely ill.  Accompanied by spouse. Eyes: No icterus. Mouth: Oropharyngeal mucosa moist and pink , no lesions erythema or exudate. Lungs: Clear to auscultation bilaterally.  Heart: Regular rate and rhythm, no murmurs rubs or gallops.  Abdomen: Bowel  sounds are normal, mild diffuse tenderness, nondistended, no hepatosplenomegaly or masses, no abdominal bruits or hernia , no rebound or guarding.   Extremities: No lower extremity edema. No clubbing or deformities. Neuro: Alert and oriented x 4   Skin: Warm and dry, no jaundice.   Psych: Alert and cooperative, normal mood and affect.  Labs:    Lab Results  Component Value Date   ALT 61 (H) 03/01/2017   AST 83 (H) 03/01/2017   ALKPHOS 116 03/01/2017   BILITOT 0.6 03/01/2017   Lab Results  Component Value Date   WBC 11.1 (H) 03/01/2017   HGB 15.9 (H) 03/01/2017   HCT 45.1 03/01/2017   MCV 89.5 03/01/2017   PLT 254 03/01/2017   Lab  Results  Component Value Date   CREATININE 0.75 03/01/2017   BUN 12 03/01/2017   NA 137 03/01/2017   K 3.8 03/01/2017   CL 99 (L) 03/01/2017   CO2 26 03/01/2017    Imaging Studies: No results found.

## 2017-03-05 NOTE — Assessment & Plan Note (Signed)
1 week history of persistent nausea/vomiting, initially with diarrhea which is resolved.  Baseline chronic nausea with borderline delayed gastric emptying study in the remote past.  Recent ED evaluation, minimal bump in AST/ALT, otherwise labs unremarkable.  Patient denies vomiting over the last 24 hours.  No significant change in blood pressure with orthostatics but with some bump in her pulse.  Discussed at length with patient and her spouse, symptoms may be secondary to gastroparesis, protracted viral illness but also cannot exclude withdrawal symptoms  from Xanax plus or minus hydrocodone.  Advised her to start back on her Xanax and Vicodin at least 50% of her regular dose and contact prescribing provider regarding instructions for slow taper if she desires to come off these medications.  Advised patient to take no more than 25 mg of Phenergan every 4 hours including both tablets and suppositories.  Rx for Compazine provided to use sparingly as needed.  Encouraged her to take 1 tablespoon of liquid every 15 minutes to maintain hydration.  If she is not able to keep her urine light yellow, vomiting worsens, lightheadedness worsens, she should go to the emergency department.  We will update labs today for electrolytes as well as CBC, transaminases.

## 2017-03-09 DIAGNOSIS — R42 Dizziness and giddiness: Secondary | ICD-10-CM | POA: Diagnosis not present

## 2017-03-09 DIAGNOSIS — R112 Nausea with vomiting, unspecified: Secondary | ICD-10-CM | POA: Diagnosis not present

## 2017-03-09 LAB — COMPREHENSIVE METABOLIC PANEL
AG Ratio: 1.9 (calc) (ref 1.0–2.5)
ALBUMIN MSPROF: 4.8 g/dL (ref 3.6–5.1)
ALKALINE PHOSPHATASE (APISO): 99 U/L (ref 33–130)
ALT: 47 U/L — ABNORMAL HIGH (ref 6–29)
AST: 53 U/L — ABNORMAL HIGH (ref 10–35)
BILIRUBIN TOTAL: 0.4 mg/dL (ref 0.2–1.2)
BUN: 12 mg/dL (ref 7–25)
CO2: 29 mmol/L (ref 20–32)
Calcium: 9.6 mg/dL (ref 8.6–10.4)
Chloride: 99 mmol/L (ref 98–110)
Creat: 0.86 mg/dL (ref 0.50–0.99)
GLOBULIN: 2.5 g/dL (ref 1.9–3.7)
Glucose, Bld: 112 mg/dL (ref 65–139)
Potassium: 3.5 mmol/L (ref 3.5–5.3)
Sodium: 140 mmol/L (ref 135–146)
Total Protein: 7.3 g/dL (ref 6.1–8.1)

## 2017-03-09 LAB — CBC WITH DIFFERENTIAL/PLATELET
BASOS ABS: 80 {cells}/uL (ref 0–200)
Basophils Relative: 0.9 %
EOS ABS: 178 {cells}/uL (ref 15–500)
Eosinophils Relative: 2 %
HEMATOCRIT: 44.4 % (ref 35.0–45.0)
HEMOGLOBIN: 15.5 g/dL (ref 11.7–15.5)
LYMPHS ABS: 2973 {cells}/uL (ref 850–3900)
MCH: 30.4 pg (ref 27.0–33.0)
MCHC: 34.9 g/dL (ref 32.0–36.0)
MCV: 87.1 fL (ref 80.0–100.0)
MPV: 10.5 fL (ref 7.5–12.5)
Monocytes Relative: 8.4 %
NEUTROS ABS: 4922 {cells}/uL (ref 1500–7800)
Neutrophils Relative %: 55.3 %
Platelets: 237 10*3/uL (ref 140–400)
RBC: 5.1 10*6/uL (ref 3.80–5.10)
RDW: 13 % (ref 11.0–15.0)
Total Lymphocyte: 33.4 %
WBC mixed population: 748 cells/uL (ref 200–950)
WBC: 8.9 10*3/uL (ref 3.8–10.8)

## 2017-03-11 NOTE — Progress Notes (Signed)
Please let patient know her labs were good. No electrolyte of normalities and her kidney function is normal. Her AST/ALT remains slightly elevated although improved since ED visit. Given abnormal LFTs, nausea/vomiting I would proceed with right upper quadrant ultrasound as next step.Please schedule.

## 2017-03-16 ENCOUNTER — Other Ambulatory Visit: Payer: Self-pay | Admitting: *Deleted

## 2017-03-16 DIAGNOSIS — R112 Nausea with vomiting, unspecified: Secondary | ICD-10-CM

## 2017-03-16 DIAGNOSIS — R7989 Other specified abnormal findings of blood chemistry: Secondary | ICD-10-CM

## 2017-03-16 DIAGNOSIS — R945 Abnormal results of liver function studies: Secondary | ICD-10-CM

## 2017-03-18 ENCOUNTER — Ambulatory Visit (HOSPITAL_COMMUNITY)
Admission: RE | Admit: 2017-03-18 | Discharge: 2017-03-18 | Disposition: A | Discharge status: Home / Self Care | Payer: Medicare HMO | Source: Ambulatory Visit | Attending: Gastroenterology | Admitting: Gastroenterology

## 2017-03-18 DIAGNOSIS — R945 Abnormal results of liver function studies: Secondary | ICD-10-CM | POA: Diagnosis present

## 2017-03-18 DIAGNOSIS — R112 Nausea with vomiting, unspecified: Secondary | ICD-10-CM

## 2017-03-18 DIAGNOSIS — R7989 Other specified abnormal findings of blood chemistry: Secondary | ICD-10-CM

## 2017-03-18 DIAGNOSIS — K760 Fatty (change of) liver, not elsewhere classified: Secondary | ICD-10-CM | POA: Insufficient documentation

## 2017-03-30 NOTE — Progress Notes (Signed)
Her u/s showed fatty liver. May be source for ast/alt elevation.  Repeat LFTs in 6 weeks.  Recommend ov with rmr to follow up n/v, elevated ast/alt. How is she doing?

## 2017-04-03 ENCOUNTER — Encounter: Payer: Self-pay | Admitting: Internal Medicine

## 2017-04-03 ENCOUNTER — Telehealth: Payer: Self-pay | Admitting: Internal Medicine

## 2017-04-03 NOTE — Telephone Encounter (Signed)
Pt was returning a call from AM regarding results. Please call her at [phone]

## 2017-04-03 NOTE — Telephone Encounter (Signed)
Pt notified of results.  See result note. 

## 2017-04-03 NOTE — Progress Notes (Signed)
Patient scheduled.

## 2017-04-22 DIAGNOSIS — I10 Essential (primary) hypertension: Secondary | ICD-10-CM | POA: Diagnosis not present

## 2017-04-22 DIAGNOSIS — J449 Chronic obstructive pulmonary disease, unspecified: Secondary | ICD-10-CM | POA: Diagnosis not present

## 2017-04-22 DIAGNOSIS — K760 Fatty (change of) liver, not elsewhere classified: Secondary | ICD-10-CM | POA: Diagnosis not present

## 2017-04-22 DIAGNOSIS — Z23 Encounter for immunization: Secondary | ICD-10-CM | POA: Diagnosis not present

## 2017-04-22 DIAGNOSIS — R69 Illness, unspecified: Secondary | ICD-10-CM | POA: Diagnosis not present

## 2017-05-18 ENCOUNTER — Other Ambulatory Visit: Payer: Self-pay | Admitting: Nurse Practitioner

## 2017-05-26 ENCOUNTER — Encounter: Payer: Self-pay | Admitting: Internal Medicine

## 2017-05-26 ENCOUNTER — Telehealth: Payer: Self-pay | Admitting: Internal Medicine

## 2017-05-26 ENCOUNTER — Other Ambulatory Visit: Payer: Self-pay

## 2017-05-26 ENCOUNTER — Ambulatory Visit: Payer: Medicare HMO | Admitting: Internal Medicine

## 2017-05-26 VITALS — BP 152/74 | HR 113 | Temp 98.0°F | Ht 63.5 in | Wt 196.8 lb

## 2017-05-26 DIAGNOSIS — K3184 Gastroparesis: Secondary | ICD-10-CM | POA: Diagnosis not present

## 2017-05-26 DIAGNOSIS — R110 Nausea: Secondary | ICD-10-CM

## 2017-05-26 DIAGNOSIS — K219 Gastro-esophageal reflux disease without esophagitis: Secondary | ICD-10-CM | POA: Diagnosis not present

## 2017-05-26 LAB — HEPATIC FUNCTION PANEL
AG Ratio: 2.1 (calc) (ref 1.0–2.5)
ALBUMIN MSPROF: 4.7 g/dL (ref 3.6–5.1)
ALKALINE PHOSPHATASE (APISO): 83 U/L (ref 33–130)
ALT: 26 U/L (ref 6–29)
AST: 23 U/L (ref 10–35)
Bilirubin, Direct: 0.1 mg/dL (ref 0.0–0.2)
Globulin: 2.2 g/dL (calc) (ref 1.9–3.7)
Indirect Bilirubin: 0.2 mg/dL (calc) (ref 0.2–1.2)
TOTAL PROTEIN: 6.9 g/dL (ref 6.1–8.1)
Total Bilirubin: 0.3 mg/dL (ref 0.2–1.2)

## 2017-05-26 MED ORDER — DRONABINOL 2.5 MG PO CAPS: 2.5000 mg | ORAL_CAPSULE | Freq: Two times a day (BID) | ORAL | 5 refills | Status: DC

## 2017-05-26 NOTE — Patient Instructions (Addendum)
Continue gastroparesis diet - information provided  Stop compazine and phenergan; no more reglan - do not ever take these medications again (they will be added to your allergy list)  Limit use of hydrocodone / opioids.  Resume marinol 2.5 mg twice daily (disp #60 with 1 year refills)  Continue omeprazole daily  Office visit with [place] in 3 months

## 2017-05-26 NOTE — Telephone Encounter (Signed)
Pt called with about PA, PA sent to plan

## 2017-05-26 NOTE — Telephone Encounter (Signed)
[place] MEDICATION RMR PRESCRIBED FOR HER TODAY

## 2017-05-26 NOTE — Progress Notes (Signed)
Primary Care Physician:  Sinda Du, MD Primary Gastroenterologist:  Dr. Gala Romney  Pre-Procedure History & Physical: HPI:  Tracey Mccullough is a 72 y.o. female here for follow-up of nausea/gastroparesis. Had some diarrhea but that resolved. Takes Compazine multiple times daily along with her Norco for her osteoarthritis. No vomiting; reflux symptoms controlled on omeprazole 40 mg daily. Mild bump in transaminases noted recently. Ultrasound demonstrated fatty liver. Doesn't remember why she was intolerant to Zofran. She felt Marinol worked for nausea previously.  Past Medical History:  Diagnosis Date  . Acid reflux   . Anxiety   . Arthritis   . COPD (chronic obstructive pulmonary disease) ([place])   . Depression   . Gastroparesis    INTOLERANT TO REGLAN; On marinol  . GERD (gastroesophageal reflux disease)   . Hemorrhoids 09/01/2011   external  . History of kidney stones   . Hypercholesterolemia   . Hypertension   . Hypokalemia 11/01/10   requiring admission  . Hypothyroidism   . Irritable bowel syndrome (IBS)   . Restless leg syndrome   . Schatzki's ring    EGD 1/12  . Stroke ([place]) 2001   No deficits  . Thyroid disease     Past Surgical History:  Procedure Laterality Date  . BIOPSY  07/14/2016   Procedure: BIOPSY;  Surgeon: Daneil Dolin, MD;  [facility];  Service: Endoscopy;;  gastric  . CARPAL TUNNEL RELEASE     bilateral  . CHOLECYSTECTOMY  1978  . COLONOSCOPY  09/2006   hemorrhoids  . COLONOSCOPY  09/01/2011   Dr. Camillia Herter hemorrhoids, tubular adenoma. next tcs 08/2016  . COLONOSCOPY WITH PROPOFOL N/A 07/14/2016   Procedure: COLONOSCOPY WITH PROPOFOL;  Surgeon: Daneil Dolin, MD;  [facility];  Service: Endoscopy;  Laterality: N/A;  930  . ESOPHAGOGASTRODUODENOSCOPY  04/2010   Schatzki ring s/p dilation, minimal retained food. Versed 7/Demeral 150/Phenergan12.5.  . ESOPHAGOGASTRODUODENOSCOPY  09/01/2011   Dr. Leodis Binet esophageal  web and schatzki's ring, small hiatal hernia, antral erosions-bx-mild chronic inflammation. circumferential distal esophageal erosions  . ESOPHAGOGASTRODUODENOSCOPY (EGD) WITH PROPOFOL N/A 07/14/2016   Procedure: ESOPHAGOGASTRODUODENOSCOPY (EGD) WITH PROPOFOL;  Surgeon: Daneil Dolin, MD;  [facility];  Service: Endoscopy;  Laterality: N/A;  . excision of axilliary sweat glands Bilateral   . FRACTURE SURGERY  2011   right arm  . KIDNEY STONE SURGERY  2008  . MALONEY DILATION N/A 07/14/2016   Procedure: Venia Minks DILATION;  Surgeon: Daneil Dolin, MD;  [facility];  Service: Endoscopy;  Laterality: N/A;  . POLYPECTOMY  07/14/2016   Procedure: POLYPECTOMY;  Surgeon: Daneil Dolin, MD;  [facility];  Service: Endoscopy;;  colon  . TUBAL LIGATION  1972    Prior to Admission medications   Medication Sig Start Date End Date Taking? Authorizing Provider  albuterol (PROAIR HFA) 108 (90 Base) MCG/ACT inhaler Inhale 2 puffs into the lungs every 6 (six) hours as needed for wheezing or shortness of breath.   Yes [provider]  amLODipine-benazepril (LOTREL) 10-20 MG per capsule Take 1 capsule by mouth daily before breakfast.    Yes [provider]  cholecalciferol (VITAMIN D) 1000 units tablet Take 1,000 Units by mouth at bedtime.   Yes [provider]  clopidogrel (PLAVIX) 75 MG tablet Take 75 mg by mouth at bedtime.    Yes [provider]  HYDROcodone-acetaminophen (NORCO) 10-325 MG per tablet Take 1 tablet by mouth every 6 (  six) hours as needed (for pain.).  12/08/13  Yes [provider]  hyoscyamine (LEVSIN, ANASPAZ) 0.125 MG tablet DISSOLVE 1 TABLET UNDER THE TONGUE EVERY 4 HOURS AS NEEDED FOR CRAMPING 05/19/17  Yes Mahala Menghini, PA-C  levothyroxine (SYNTHROID, LEVOTHROID) 75 MCG tablet Take 75 mcg by mouth daily before breakfast.   Yes [provider]  Multiple Vitamin (MULTIVITAMIN WITH MINERALS) TABS tablet  Take 1 tablet by mouth daily.   Yes [provider]  omeprazole (PRILOSEC) 40 MG capsule TAKE ONE CAPSULE BY MOUTH EVERY DAY 02/03/17  Yes Mahala Menghini, PA-C  potassium chloride (K-DUR) 10 MEQ tablet Take 10 mEq by mouth daily before breakfast.   Yes [provider]  pravastatin (PRAVACHOL) 20 MG tablet Take 20 mg by mouth daily before breakfast.    Yes [provider]  prochlorperazine (COMPAZINE) 5 MG tablet Take 1 tablet (5 mg total) every 8 (eight) hours as needed by mouth for nausea or vomiting. 03/05/17  Yes Mahala Menghini, PA-C  promethazine (PHENERGAN) 25 MG suppository Place 1 suppository (25 mg total) every 6 (six) hours as needed rectally for nausea or vomiting. No more than 25mg  of phenergan every 4 hours from either tablets or suppositories. 03/05/17  Yes Mahala Menghini, PA-C  promethazine (PHENERGAN) 25 MG tablet Take 25 mg by mouth every 4 (four) hours as needed for nausea.  12/08/13  Yes [provider]  rOPINIRole (REQUIP) 1 MG tablet Take 1 mg at bedtime by mouth. 02/19/17  Yes [provider]  torsemide (DEMADEX) 20 MG tablet Take 20 mg by mouth daily before breakfast.   Yes [provider]    Allergies as of 05/26/2017 - Review Complete 05/26/2017  Allergen Reaction Noted  . Metoclopramide hcl Other (See Comments)   . Penicillins Anaphylaxis   . Zofran Other (See Comments) 11/26/2010    Family History  Problem Relation Age of Onset  . Cancer Father        ?leukemia  . Cancer Sister   . Colon cancer Brother 33  . Colon cancer Sister 68       recently diagnosed    Social History   Socioeconomic History  . Marital status: Married    Spouse name: Not on file  . Number of children: 3  . Years of education: Not on file  . Highest education level: Not on file  Social Needs  . Financial resource strain: Not on file  . Food insecurity - worry: Not on file  . Food insecurity - inability: Not on file  .  Transportation needs - medical: Not on file  . Transportation needs - non-medical: Not on file  Occupational History    Employer: UNEMPLOYED  Tobacco Use  . Smoking status: Current Every Day Smoker    Packs/day: 1.75    Years: 53.00    Pack years: 92.75    Types: Cigarettes  . Smokeless tobacco: Never Used  Substance and Sexual Activity  . Alcohol use: No  . Drug use: No  . Sexual activity: Not Currently    Birth control/protection: Post-menopausal  Other Topics Concern  . Not on file  Social History Narrative  . Not on file    Review of Systems: See HPI, otherwise negative ROS  Physical Exam: BP (!) 152/74   Pulse (!) 113   Temp 98 F (36.7 C) (Oral)   Ht 5' 3.5" (1.613 m)   Wt 196 lb 12.8 oz (89.3 kg)   BMI 34.31  kg/m  General:   Alert,   pleasant and cooperative in NAD. Extra-pyramidal tongue and jaw movements observed today. Neck:  Supple; no masses or thyromegaly. No significant cervical adenopathy. Lungs:  Clear throughout to auscultation.   No wheezes, crackles, or rhonchi. No acute distress. Heart:  Regular rate and rhythm; no murmurs, clicks, rubs,  or gallops. Abdomen: Non-distended, normal bowel sounds.  No succussion splash.Soft and nontender without appreciable mass or hepatosplenomegaly.  Pulses:  Normal pulses noted. Extremities:  Without clubbing or edema.  Impression:  Symptomatic gastroparesis and, in large part, narcotic induced. She has extrapyramidal side effects now from Compazine. Mild transaminitis setting of a fatty appearing liver on ultrasound. Reflux symptoms, fortunately, controlled on omeprazole 40 mg once daily  Recommendations:  Continue gastroparesis diet - information provided  Stop compazine and phenergan; no more Reglan as well - I feel patient should never take these medications again.  She should limit the use of Limit use of hydrocodone / opioids.  Resume Marinol 2.5 mg twice daily (disp #60 with 1 year refills)  Continue  omeprazole daily  Repeat hepatic profile.  Office visit with [place] in 3 months      Notice: This dictation was prepared with Dragon dictation along with smaller phrase technology. Any transcriptional errors that result from this process are unintentional and may not be corrected upon review.

## 2017-05-27 DIAGNOSIS — H40013 Open angle with borderline findings, low risk, bilateral: Secondary | ICD-10-CM | POA: Diagnosis not present

## 2017-05-28 ENCOUNTER — Telehealth: Payer: Self-pay

## 2017-05-28 NOTE — Telephone Encounter (Signed)
Pt called asking how much her prescription Dronabinol 2.5mg  would be out of pocket. Pt was asked to call the pharmacy to find out the exact price. On [place] I gave pt two coupons. For 60 capsules the prices is around $107 & for 30 capsules its around $48.   Pts PA was denied and I'm currently working on an appeal.   Pt was asked to never take Compazine, Phenergan, Reglan again.   Pt called back and the pharmcy is going to give her the 30 capsules for $54.00.

## 2017-06-04 ENCOUNTER — Telehealth: Payer: Self-pay | Admitting: Internal Medicine

## 2017-06-04 NOTE — Telephone Encounter (Signed)
Should be addressed by the provider who is assigned to prior auth/appeals this week or the prescribing provider.

## 2017-06-04 NOTE — Telephone Encounter (Signed)
Pt called asking to speak with AM. Please call her back at [phone]

## 2017-06-04 NOTE — Telephone Encounter (Signed)
Spoke with pt, and she received a denial letter from the appeal I submitted. I also was told by pts company that another appeal can be made. Pt is allergic to all the other nausea medications. Can a letter be sent on behalf of this pt that is miserable without nausea medicine? I will also contact [place] as well.

## 2017-06-05 NOTE — Telephone Encounter (Signed)
Spoke with pt and pt is aware of me going to talk with a pt assistance rep.

## 2017-06-05 NOTE — Telephone Encounter (Signed)
Okay. Noted. See what you can find out.

## 2017-06-05 NOTE — Telephone Encounter (Signed)
Spoke with [employer] will not approve Dronabinol 2.5mg  capsule, due to Medicare Law, Part D only covers medicine when prescribed for a medically accepted use. Pts dx isn't a medically accepted use, even though pt has allergies to the other nausea meds.   I will check into pt assistance for pt. Not sure of any other options

## 2017-06-09 NOTE — Telephone Encounter (Signed)
Spoke with [employer] rep for Marinol and they no longer accept new pts for pt assistance. They only accept firmer pts.  Pt was notified. Pt has coupon that was given to her, pt will use it for a reduced price. Medication will run $100 plus dollars. Pt will call back if needed.

## 2017-07-21 DIAGNOSIS — K210 Gastro-esophageal reflux disease with esophagitis: Secondary | ICD-10-CM | POA: Diagnosis not present

## 2017-07-21 DIAGNOSIS — R109 Unspecified abdominal pain: Secondary | ICD-10-CM | POA: Diagnosis not present

## 2017-07-21 DIAGNOSIS — J449 Chronic obstructive pulmonary disease, unspecified: Secondary | ICD-10-CM | POA: Diagnosis not present

## 2017-07-21 DIAGNOSIS — I10 Essential (primary) hypertension: Secondary | ICD-10-CM | POA: Diagnosis not present

## 2017-07-21 DIAGNOSIS — R079 Chest pain, unspecified: Secondary | ICD-10-CM | POA: Diagnosis not present

## 2017-07-23 DIAGNOSIS — R109 Unspecified abdominal pain: Secondary | ICD-10-CM | POA: Diagnosis not present

## 2017-07-23 DIAGNOSIS — I10 Essential (primary) hypertension: Secondary | ICD-10-CM | POA: Diagnosis not present

## 2017-07-23 DIAGNOSIS — K210 Gastro-esophageal reflux disease with esophagitis: Secondary | ICD-10-CM | POA: Diagnosis not present

## 2017-07-23 DIAGNOSIS — J449 Chronic obstructive pulmonary disease, unspecified: Secondary | ICD-10-CM | POA: Diagnosis not present

## 2017-08-18 ENCOUNTER — Other Ambulatory Visit (HOSPITAL_COMMUNITY): Payer: Self-pay | Admitting: Pulmonary Disease

## 2017-08-18 DIAGNOSIS — R109 Unspecified abdominal pain: Secondary | ICD-10-CM

## 2017-08-18 DIAGNOSIS — R112 Nausea with vomiting, unspecified: Secondary | ICD-10-CM

## 2017-08-24 ENCOUNTER — Telehealth: Payer: Self-pay

## 2017-08-24 NOTE — Telephone Encounter (Signed)
Noted  

## 2017-08-24 NOTE — Telephone Encounter (Addendum)
Elmo Putt, see my response. I sent it to Almyra Free due to original message coming from her. Agree with advise regarding dizziness, if severe she likely should go ahead and go to ER unless PCP can see her TODAY.

## 2017-08-24 NOTE — Telephone Encounter (Signed)
Spoke with pt and she d/c her medication Robinol based on the pharmacy telling her to d/c until she described her symptoms to our office. Pt has been taking medication since 05/2017 with no symptoms of dizziness. Pt experienced severe dizziness this weekend and has some nausea due to the dizziness. Pt is aware that RMR isn't in the office and said is there anything else she can take. Pt is aware that she is alergic to all the other nausea medications prescribed previously. Pt was advised to call her PCP for her severe dizziness and if things worsened, she should be seen by the ED.

## 2017-08-24 NOTE — Telephone Encounter (Signed)
I honestly don't know what other nausea medications to offer.   She has been advised not to take compazine, phenergan, reglan. She was intolerant to zofran before per RMR's note. Marinol prescribed back in 05/2017. Previously could not afford erythromycin. She had borderline delayed GES in 2010. In 2012, she had abnormal GES four hour study at [facility]. She is scheduled for CT Chest/abd/pelvis in couple of weeks by PCP.  WOULD ADVISE TO DISCUSS WITH RMR TOMORROW.

## 2017-08-24 NOTE — Telephone Encounter (Signed)
Pt called- she said RMR gave her marinol at her last visit and she took it for about a week and did well but after that she said she felt like she was having side effects from the medication. She said she is dizzy and is having pain and cramps on her L side. Pt stated she is unable to take phenergan and zofran as well. She called her pharmacy over the weekend and they advised her to stop taking it until she spoke with our office. Pt is feeling nauseous now and wants to know if there is anything else she can take. Her number is [phone].

## 2017-09-04 ENCOUNTER — Ambulatory Visit (HOSPITAL_COMMUNITY)
Admission: RE | Admit: 2017-09-04 | Discharge: 2017-09-04 | Disposition: A | Discharge status: Home / Self Care | Payer: Medicare HMO | Source: Ambulatory Visit | Attending: Pulmonary Disease | Admitting: Pulmonary Disease

## 2017-09-04 DIAGNOSIS — J438 Other emphysema: Secondary | ICD-10-CM | POA: Insufficient documentation

## 2017-09-04 DIAGNOSIS — J432 Centrilobular emphysema: Secondary | ICD-10-CM | POA: Insufficient documentation

## 2017-09-04 DIAGNOSIS — R918 Other nonspecific abnormal finding of lung field: Secondary | ICD-10-CM | POA: Insufficient documentation

## 2017-09-04 DIAGNOSIS — R1110 Vomiting, unspecified: Secondary | ICD-10-CM | POA: Diagnosis not present

## 2017-09-04 DIAGNOSIS — R109 Unspecified abdominal pain: Secondary | ICD-10-CM | POA: Insufficient documentation

## 2017-09-04 DIAGNOSIS — R112 Nausea with vomiting, unspecified: Secondary | ICD-10-CM | POA: Diagnosis not present

## 2017-09-04 DIAGNOSIS — I700 Atherosclerosis of aorta: Secondary | ICD-10-CM | POA: Diagnosis not present

## 2017-09-04 DIAGNOSIS — R911 Solitary pulmonary nodule: Secondary | ICD-10-CM | POA: Diagnosis not present

## 2017-09-04 LAB — POCT I-STAT CREATININE: CREATININE: 0.7 mg/dL (ref 0.44–1.00)

## 2017-09-04 MED ORDER — IOPAMIDOL (ISOVUE-300) INJECTION 61%
100.0000 mL | Freq: Once | INTRAVENOUS | Status: AC | PRN
  Administered 2017-09-04: 100 mL via INTRAVENOUS

## 2017-09-04 MED ORDER — IOPAMIDOL (ISOVUE-300) INJECTION 61%
30.0000 mL | Freq: Once | INTRAVENOUS | Status: AC | PRN
  Administered 2017-09-04: 30 mL via ORAL

## 2017-10-20 DIAGNOSIS — Z79891 Long term (current) use of opiate analgesic: Secondary | ICD-10-CM | POA: Diagnosis not present

## 2017-10-20 DIAGNOSIS — I10 Essential (primary) hypertension: Secondary | ICD-10-CM | POA: Diagnosis not present

## 2017-10-20 DIAGNOSIS — M129 Arthropathy, unspecified: Secondary | ICD-10-CM | POA: Diagnosis not present

## 2017-10-20 DIAGNOSIS — M545 Low back pain: Secondary | ICD-10-CM | POA: Diagnosis not present

## 2017-10-20 DIAGNOSIS — J449 Chronic obstructive pulmonary disease, unspecified: Secondary | ICD-10-CM | POA: Diagnosis not present

## 2017-10-21 ENCOUNTER — Encounter: Payer: Self-pay | Admitting: Gastroenterology

## 2017-10-21 ENCOUNTER — Ambulatory Visit: Payer: Medicare HMO | Admitting: Gastroenterology

## 2017-10-21 VITALS — BP 133/73 | HR 90 | Temp 98.7°F | Ht 63.5 in | Wt 183.4 lb

## 2017-10-21 DIAGNOSIS — K3184 Gastroparesis: Secondary | ICD-10-CM

## 2017-10-21 NOTE — Progress Notes (Signed)
Referring Provider: Sinda Du, MD Primary Care Physician:  Sinda Du, MD Primary GI: Dr. Gala Romney   Chief Complaint  Patient presents with  . Nausea  . Abdominal Pain    cramping    HPI:   Tracey Mccullough is a 73 y.o. female presenting today with a history of gastroparesis, fatty liver, unable to tolerate compazine, phenergan, and Reglan due to extrapyramidal side effects. Started back on Marinol BID in Feb 2019 when last seen. CT abd/pelvis with contrast by Dr. Luan Pulling May 2019 without acute abdominal process. Lung nodule followed by Dr. Luan Pulling.   Purposefully losing weight. Eating smaller portions. Taking Marinol BID. Overall feels better. Has cut way back on pain medications. Still has intermittent nausea but deals with it. Levsin helps with cramping.    Past Medical History:  Diagnosis Date  . Acid reflux   . Anxiety   . Arthritis   . COPD (chronic obstructive pulmonary disease) ([place])   . Depression   . Gastroparesis    INTOLERANT TO REGLAN; On marinol  . GERD (gastroesophageal reflux disease)   . Hemorrhoids 09/01/2011   external  . History of kidney stones   . Hypercholesterolemia   . Hypertension   . Hypokalemia 11/01/10   requiring admission  . Hypothyroidism   . Irritable bowel syndrome (IBS)   . Restless leg syndrome   . Schatzki's ring    EGD 1/12  . Stroke ([place]) 2001   No deficits  . Thyroid disease     Past Surgical History:  Procedure Laterality Date  . BIOPSY  07/14/2016   Procedure: BIOPSY;  Surgeon: Daneil Dolin, MD;  [facility];  Service: Endoscopy;;  gastric  . CARPAL TUNNEL RELEASE     bilateral  . CHOLECYSTECTOMY  1978  . COLONOSCOPY  09/2006   hemorrhoids  . COLONOSCOPY  09/01/2011   Dr. Camillia Herter hemorrhoids, tubular adenoma. next tcs 08/2016  . COLONOSCOPY WITH PROPOFOL N/A 07/14/2016   multiple adenomas, diverticulosis. Surveillance in 3 years   . ESOPHAGOGASTRODUODENOSCOPY  04/2010   Schatzki ring s/p  dilation, minimal retained food. Versed 7/Demeral 150/Phenergan12.5.  . ESOPHAGOGASTRODUODENOSCOPY  09/01/2011   Dr. Leodis Binet esophageal web and schatzki's ring, small hiatal hernia, antral erosions-bx-mild chronic inflammation. circumferential distal esophageal erosions  . ESOPHAGOGASTRODUODENOSCOPY (EGD) WITH PROPOFOL N/A 07/14/2016   mild Schatki's ring s/p dilation, small hiatal hernia, mild chronic inactive gastritis, negative H.pylori  . excision of axilliary sweat glands Bilateral   . FRACTURE SURGERY  2011   right arm  . KIDNEY STONE SURGERY  2008  . MALONEY DILATION N/A 07/14/2016   Procedure: Venia Minks DILATION;  Surgeon: Daneil Dolin, MD;  [facility];  Service: Endoscopy;  Laterality: N/A;  . POLYPECTOMY  07/14/2016   Procedure: POLYPECTOMY;  Surgeon: Daneil Dolin, MD;  [facility];  Service: Endoscopy;;  colon  . TUBAL LIGATION  1972    Current Outpatient Medications  Medication Sig Dispense Refill  . albuterol (PROAIR HFA) 108 (90 Base) MCG/ACT inhaler Inhale 2 puffs into the lungs every 6 (six) hours as needed for wheezing or shortness of breath.    Marland Kitchen amLODipine-benazepril (LOTREL) 10-20 MG per capsule Take 1 capsule by mouth daily before breakfast.     . clopidogrel (PLAVIX) 75 MG tablet Take 75 mg by mouth at bedtime.     . dronabinol (MARINOL) 2.5 MG capsule Take 1 capsule (2.5 mg total) by mouth 2 (two) times daily. 60 capsule 5  .  HYDROcodone-acetaminophen (NORCO) 10-325 MG per tablet Take 1 tablet by mouth every 6 (six) hours as needed (for pain.).     Marland Kitchen hyoscyamine (LEVSIN, ANASPAZ) 0.125 MG tablet DISSOLVE 1 TABLET UNDER THE TONGUE EVERY 4 HOURS AS NEEDED FOR CRAMPING 90 tablet 1  . levothyroxine (SYNTHROID, LEVOTHROID) 75 MCG tablet Take 75 mcg by mouth daily before breakfast.    . Multiple Vitamin (MULTIVITAMIN WITH MINERALS) TABS tablet Take 1 tablet by mouth. Doesn't take daily    . omeprazole (PRILOSEC) 40 MG capsule TAKE ONE CAPSULE  BY MOUTH EVERY DAY 30 capsule 11  . potassium chloride (K-DUR) 10 MEQ tablet Take 10 mEq by mouth daily before breakfast.    . pravastatin (PRAVACHOL) 20 MG tablet Take 20 mg by mouth daily before breakfast.     . rOPINIRole (REQUIP) 1 MG tablet Take 1 mg at bedtime by mouth.  12  . torsemide (DEMADEX) 20 MG tablet Take 20 mg by mouth daily before breakfast.    . prochlorperazine (COMPAZINE) 5 MG tablet Take 1 tablet (5 mg total) every 8 (eight) hours as needed by mouth for nausea or vomiting. (Patient not taking: Reported on 10/21/2017) 30 tablet 0  . promethazine (PHENERGAN) 25 MG suppository Place 1 suppository (25 mg total) every 6 (six) hours as needed rectally for nausea or vomiting. No more than 25mg  of phenergan every 4 hours from either tablets or suppositories. (Patient not taking: Reported on 10/21/2017) 12 suppository 0  . promethazine (PHENERGAN) 25 MG tablet Take 25 mg by mouth every 4 (four) hours as needed for nausea.      No current facility-administered medications for this visit.     Allergies as of 10/21/2017 - Review Complete 10/21/2017  Allergen Reaction Noted  . Metoclopramide hcl Other (See Comments)   . Penicillins Anaphylaxis   . Zofran Other (See Comments) 11/26/2010  . Compazine [prochlorperazine edisylate]  05/26/2017  . Phenergan [promethazine hcl]  05/26/2017  . Reglan [metoclopramide]  05/26/2017    Family History  Problem Relation Age of Onset  . Cancer Father        ?leukemia  . Cancer Sister   . Colon cancer Brother 19  . Colon cancer Sister 48       recently diagnosed    Social History   Socioeconomic History  . Marital status: Married    Spouse name: Not on file  . Number of children: 3  . Years of education: Not on file  . Highest education level: Not on file  Occupational History    Employer: UNEMPLOYED  Social Needs  . Financial resource strain: Not on file  . Food insecurity:    Worry: Not on file    Inability: Not on file  .  Transportation needs:    Medical: Not on file    Non-medical: Not on file  Tobacco Use  . Smoking status: Current Every Day Smoker    Packs/day: 1.75    Years: 53.00    Pack years: 92.75    Types: Cigarettes  . Smokeless tobacco: Never Used  Substance and Sexual Activity  . Alcohol use: No  . Drug use: No  . Sexual activity: Not Currently    Birth control/protection: Post-menopausal  Lifestyle  . Physical activity:    Days per week: Not on file    Minutes per session: Not on file  . Stress: Not on file  Relationships  . Social connections:    Talks on phone: Not on file  Gets together: Not on file    Attends religious service: Not on file    Active member of club or organization: Not on file    Attends meetings of clubs or organizations: Not on file    Relationship status: Not on file  Other Topics Concern  . Not on file  Social History Narrative  . Not on file    Review of Systems: Gen: Denies fever, chills, anorexia. Denies fatigue, weakness, weight loss.  CV: Denies chest pain, palpitations, syncope, peripheral edema, and claudication. Resp: Denies dyspnea at rest, cough, wheezing, coughing up blood, and pleurisy. GI: see HPI  Derm: Denies rash, itching, dry skin Psych: Denies depression, anxiety, memory loss, confusion. No homicidal or suicidal ideation.  Heme: Denies bruising, bleeding, and enlarged lymph nodes.  Physical Exam: BP 133/73   Pulse 90   Temp 98.7 F (37.1 C) (Oral)   Ht 5' 3.5" (1.613 m)   Wt 183 lb 6.4 oz (83.2 kg)   BMI 31.98 kg/m  General:   Alert and oriented. No distress noted. Pleasant and cooperative.  Head:  Normocephalic and atraumatic. Eyes:  Conjuctiva clear without scleral icterus. Mouth:  Oral mucosa pink and moist.  Abdomen:  +BS, soft, non-tender and non-distended. No rebound or guarding. No HSM or masses noted. Msk:  Symmetrical without gross deformities. Normal posture. Extremities:  Without edema. Neurologic:  Alert and   oriented x4 Psych:  Alert and cooperative. Normal mood and affect.  Lab Results  Component Value Date   ALT 26 05/26/2017   AST 23 05/26/2017   ALKPHOS 116 03/01/2017   BILITOT 0.3 05/26/2017

## 2017-10-21 NOTE — Patient Instructions (Signed)
I am glad you are doing better!  We will see you in 6 months.  Your last liver numbers were normal. We can keep an eye on this intermittently.   Please call with any issues!  It was a pleasure to see you today. I strive to create trusting relationships with patients to provide genuine, compassionate, and quality care. I value your feedback. If you receive a survey regarding your visit,  I greatly appreciate you taking time to fill this out.   Annitta Needs, PhD, ANP-BC [facility]

## 2017-10-21 NOTE — Progress Notes (Signed)
cc'd to pcp 

## 2017-10-21 NOTE — Assessment & Plan Note (Signed)
72 year old female with gastroparesis, intolerant to compazine, phenergan, Reglan due to extrapyramidal side effects. Doing well with Marinol BID. Continue dietary and behavior modification as she is doing. Will see her in 6 months.  As of note: history of fatty liver, with most recent LFTs normal in Feb 2019.

## 2017-12-04 DIAGNOSIS — H539 Unspecified visual disturbance: Secondary | ICD-10-CM | POA: Diagnosis not present

## 2017-12-07 DIAGNOSIS — H40013 Open angle with borderline findings, low risk, bilateral: Secondary | ICD-10-CM | POA: Diagnosis not present

## 2017-12-07 DIAGNOSIS — H5310 Unspecified subjective visual disturbances: Secondary | ICD-10-CM | POA: Diagnosis not present

## 2017-12-18 ENCOUNTER — Other Ambulatory Visit: Payer: Self-pay | Admitting: Internal Medicine

## 2017-12-18 DIAGNOSIS — R110 Nausea: Secondary | ICD-10-CM

## 2018-01-26 ENCOUNTER — Other Ambulatory Visit (HOSPITAL_COMMUNITY): Payer: Self-pay | Admitting: Pulmonary Disease

## 2018-01-26 DIAGNOSIS — Z23 Encounter for immunization: Secondary | ICD-10-CM | POA: Diagnosis not present

## 2018-01-26 DIAGNOSIS — Z0000 Encounter for general adult medical examination without abnormal findings: Secondary | ICD-10-CM | POA: Diagnosis not present

## 2018-01-26 DIAGNOSIS — I739 Peripheral vascular disease, unspecified: Secondary | ICD-10-CM

## 2018-01-29 ENCOUNTER — Ambulatory Visit (HOSPITAL_COMMUNITY)
Admission: RE | Admit: 2018-01-29 | Discharge: 2018-01-29 | Disposition: A | Discharge status: Home / Self Care | Payer: Medicare HMO | Source: Ambulatory Visit | Attending: Pulmonary Disease | Admitting: Pulmonary Disease

## 2018-01-29 DIAGNOSIS — M129 Arthropathy, unspecified: Secondary | ICD-10-CM | POA: Diagnosis not present

## 2018-01-29 DIAGNOSIS — R609 Edema, unspecified: Secondary | ICD-10-CM | POA: Diagnosis not present

## 2018-01-29 DIAGNOSIS — M810 Age-related osteoporosis without current pathological fracture: Secondary | ICD-10-CM | POA: Diagnosis not present

## 2018-01-29 DIAGNOSIS — Z0000 Encounter for general adult medical examination without abnormal findings: Secondary | ICD-10-CM | POA: Diagnosis not present

## 2018-01-29 DIAGNOSIS — J449 Chronic obstructive pulmonary disease, unspecified: Secondary | ICD-10-CM | POA: Diagnosis not present

## 2018-01-29 DIAGNOSIS — I6350 Cerebral infarction due to unspecified occlusion or stenosis of unspecified cerebral artery: Secondary | ICD-10-CM | POA: Diagnosis not present

## 2018-01-29 DIAGNOSIS — I70213 Atherosclerosis of native arteries of extremities with intermittent claudication, bilateral legs: Secondary | ICD-10-CM | POA: Diagnosis not present

## 2018-01-29 DIAGNOSIS — K760 Fatty (change of) liver, not elsewhere classified: Secondary | ICD-10-CM | POA: Diagnosis not present

## 2018-01-29 DIAGNOSIS — I739 Peripheral vascular disease, unspecified: Secondary | ICD-10-CM | POA: Diagnosis not present

## 2018-01-29 DIAGNOSIS — K210 Gastro-esophageal reflux disease with esophagitis: Secondary | ICD-10-CM | POA: Diagnosis not present

## 2018-01-29 DIAGNOSIS — R635 Abnormal weight gain: Secondary | ICD-10-CM | POA: Diagnosis not present

## 2018-01-29 DIAGNOSIS — I10 Essential (primary) hypertension: Secondary | ICD-10-CM | POA: Diagnosis not present

## 2018-02-23 ENCOUNTER — Other Ambulatory Visit: Payer: Self-pay | Admitting: Gastroenterology

## 2018-03-01 ENCOUNTER — Ambulatory Visit: Payer: Medicare HMO | Admitting: Gastroenterology

## 2018-03-01 ENCOUNTER — Encounter: Payer: Self-pay | Admitting: Gastroenterology

## 2018-03-01 VITALS — BP 146/75 | HR 93 | Temp 96.5°F | Ht 63.0 in | Wt 186.4 lb

## 2018-03-01 DIAGNOSIS — R1012 Left upper quadrant pain: Secondary | ICD-10-CM | POA: Diagnosis not present

## 2018-03-01 DIAGNOSIS — K3184 Gastroparesis: Secondary | ICD-10-CM

## 2018-03-01 NOTE — Assessment & Plan Note (Signed)
Left upper quadrant pain initially began back in April.  CT chest/abdomen/pelvis at that time.  Symptoms had dramatically improved but returned over the past couple months, specifically worse in the past couple of weeks.  Etiology unclear, questionable in part due to musculoskeletal component given symptoms worse with increased activity and improves when she lies flat on her back and stretches out.  Does have a postprandial component therefore cannot exclude symptoms related to gastroparesis, gastritis.  Interestingly prior to decline in her symptoms, she dropped back on her omeprazole to every other day.  With increasing narcotic use she has had increased nausea in the setting of gastroparesis.  She denies any significant constipation.    We will have her go back on omeprazole 40 mg daily before breakfast.  Continue Marinol 2.5 mg twice daily.  Limit narcotic use.  FODMAP diet provided, limit high FODMAP foods.  We will gain further recommendations from Dr. Gala Romney. ?repeat EGD vs tertiary care referral for second opinion.

## 2018-03-01 NOTE — Progress Notes (Signed)
Primary Care Physician: Sinda Du, MD  Primary Gastroenterologist:  Garfield Cornea, MD   Chief Complaint  Patient presents with  . Abdominal Pain    both sides, c/o left side swelling into LUQ.  . Nausea    HPI: Tracey Mccullough is a 72 y.o. female here for follow-up.  Seen back in July.  History of gastroparesis, fatty liver.  Unable to tolerate Compazine, Phenergan, Reglan due to extraparametal side effects.  Started on Marinol twice daily in February.  CT abdomen pelvis with contrast by Dr. Luan Pulling in May 2019 with no acute abdominal process.  Lung nodule followed by Dr. Luan Pulling.  Last EGD and colonoscopy was in March 2018.  She had a mild Schatzki ring status post dilation, small hiatal hernia, mild chronic inactive gastritis, negative H. pylori.  Multiple adenomas removed from the colon, diverticulosis.  Surveillance colonoscopy planned for 3 years.  She was better when back in July.  Patient states her symptoms started recurring in August.  Over the weekend she felt really bad.  She complains of left upper quadrant pain which initially started back in April.  It settled down until around August but really bad beginning last month again.  Pain sometimes worse with meals.  Worse with increased movement.  When she feels good she tries to be more active and this usually will result in increased left upper quadrant pain.  Sometimes she gets relief if she lays down, usually flat on her back.  She was provided Percocet for this pain but did not tolerate it.  She continues her hydrocodone had been taking only one a day but for the past few weeks has had to take 2 to 3/day to control her pain.  She has noted increased issues with nausea, no vomiting.  Weight is up 3 pounds in the past 4 months.  She had some blood work in August.  She brought a copy today, as outlined below.  Potassium was increased to twice daily because of mild hypokalemia.  She reports she also had a trial of  doxycycline for the left upper quadrant pain but did not tolerate medication due to side effects.  She completed a course of Cipro but no significant change in her symptoms.  She states she has been off her Xanax for about 4 months, recently started back on 0.5 mg as needed.  Takes about 1/day.  She cut back on her omeprazole to every other day or so.  She does not like to take medicines if she does not need them.  Over the weekend her left upper quadrant pain became worse, now she has pain in the entire upper abdomen.  She describes intermittent abdominal distention.  States she felt the pain on the left side but nothing that was visible.  Noted more with sitting and standing.  She worries about having fluid in her belly.  Movement most days.  If she skips a day she will take a dose of MiraLAX with good results.  No melena or rectal bleeding.    Current Outpatient Medications  Medication Sig Dispense Refill  . albuterol (PROAIR HFA) 108 (90 Base) MCG/ACT inhaler Inhale 2 puffs into the lungs every 6 (six) hours as needed for wheezing or shortness of breath.    . ALPRAZolam (XANAX) 0.5 MG tablet Take 0.5 mg by mouth at bedtime as needed for anxiety.    Marland Kitchen amLODipine-benazepril (LOTREL) 10-20 MG per capsule Take 1 capsule by mouth daily before breakfast.     .  clopidogrel (PLAVIX) 75 MG tablet Take 75 mg by mouth at bedtime.     . dronabinol (MARINOL) 2.5 MG capsule TAKE 1 CAPSULE BY MOUTH 2 TIMES A DAY 60 capsule 5  . HYDROcodone-acetaminophen (NORCO) 10-325 MG per tablet Take 1 tablet by mouth every 6 (six) hours as needed (for pain.).     Marland Kitchen hyoscyamine (LEVSIN, ANASPAZ) 0.125 MG tablet DISSOLVE 1 TABLET UNDER THE TONGUE EVERY 4 HOURS AS NEEDED FOR CRAMPING 90 tablet 1  . levothyroxine (SYNTHROID, LEVOTHROID) 75 MCG tablet Take 75 mcg by mouth daily before breakfast.    . omeprazole (PRILOSEC) 40 MG capsule TAKE 1 CAPSULE BY MOUTH EVERY DAY (Patient taking differently: every other day. ) 30 capsule  11  . potassium chloride (K-DUR) 10 MEQ tablet Take 10 mEq by mouth 2 (two) times daily.     . pravastatin (PRAVACHOL) 20 MG tablet Take 20 mg by mouth daily before breakfast.     . rOPINIRole (REQUIP) 1 MG tablet Take 1 mg at bedtime by mouth.  12  . torsemide (DEMADEX) 20 MG tablet Take 20 mg by mouth daily before breakfast.     No current facility-administered medications for this visit.     Allergies as of 03/01/2018 - Review Complete 03/01/2018  Allergen Reaction Noted  . Metoclopramide hcl Other (See Comments)   . Penicillins Anaphylaxis   . Zofran Other (See Comments) 11/26/2010  . Compazine [prochlorperazine edisylate]  05/26/2017  . Phenergan [promethazine hcl]  05/26/2017  . Reglan [metoclopramide]  05/26/2017    ROS:  General: Negative for anorexia, weight loss, fever, chills, positive fatigue, weakness. ENT: Negative for hoarseness, difficulty swallowing , nasal congestion. CV: Negative for chest pain, angina, palpitations, dyspnea on exertion, peripheral edema.  Respiratory: Negative for dyspnea at rest, dyspnea on exertion, cough, sputum, wheezing.  GI: See history of present illness. GU:  Negative for dysuria, hematuria, urinary incontinence, urinary frequency, nocturnal urination.  Endo: Negative for unusual weight change.    Physical Examination:   BP (!) 146/75   Pulse 93   Temp (!) 96.5 F (35.8 C) (Oral)   Ht 5\' 3"  (1.6 m)   Wt 186 lb 6.4 oz (84.6 kg)   BMI 33.02 kg/m   General: Well-nourished, well-developed in no acute distress.  Eyes: No icterus. Mouth: Oropharyngeal mucosa moist and pink , no lesions erythema or exudate. Lungs: Clear to auscultation bilaterally.  Heart: Regular rate and rhythm, no murmurs rubs or gallops.  Abdomen: Bowel sounds are normal, mild left upper quadrant tenderness, nondistended, no hepatosplenomegaly or masses, no abdominal bruits or hernia , no rebound or guarding.  No CVA tenderness.  No tenderness with palpation of  the anterior ribs. Extremities: No lower extremity edema. No clubbing or deformities. Neuro: Alert and oriented x 4   Skin: Warm and dry, no jaundice.   Psych: Alert and cooperative, normal mood and affect.  Labs:  Labs from August 2019 Glucose 118, creatinine 0.63, BUN 7, sodium 142, potassium 3.3, calcium 9.3, albumin 4.4, total bilirubin 0.3, alkaline phosphatase 92, AST 13, ALT 11, white blood cell count 7400, hemoglobin 14.5, hematocrit 43, platelets 240,000  Imaging Studies: No results found.

## 2018-03-01 NOTE — Patient Instructions (Addendum)
1. Please review the FODMAP diet list. Limit foods from the HIGH FODMAP column. 2. Continue to eat small portions multiple times a day. Anything fatty/greasy will take longer to digest and can make your bloating and nausea worse.  3. Go back on omeprazole 40mg  EVERY DAY 30 minutes before breakfast.  4. I will discuss further with Dr. Gala Romney, further recommendations to follow.  5. Keep appointment in 04/2018 as scheduled.

## 2018-03-02 NOTE — Progress Notes (Signed)
cc'ed to pcp °

## 2018-03-05 ENCOUNTER — Other Ambulatory Visit (HOSPITAL_COMMUNITY): Payer: Self-pay | Admitting: Pulmonary Disease

## 2018-03-05 DIAGNOSIS — R911 Solitary pulmonary nodule: Secondary | ICD-10-CM

## 2018-03-09 ENCOUNTER — Ambulatory Visit (HOSPITAL_COMMUNITY): Payer: Medicare HMO

## 2018-03-15 ENCOUNTER — Ambulatory Visit (HOSPITAL_COMMUNITY)
Admission: RE | Admit: 2018-03-15 | Discharge: 2018-03-15 | Disposition: A | Discharge status: Home / Self Care | Payer: Medicare HMO | Source: Ambulatory Visit | Attending: Pulmonary Disease | Admitting: Pulmonary Disease

## 2018-03-15 DIAGNOSIS — R911 Solitary pulmonary nodule: Secondary | ICD-10-CM | POA: Insufficient documentation

## 2018-03-15 DIAGNOSIS — R918 Other nonspecific abnormal finding of lung field: Secondary | ICD-10-CM | POA: Diagnosis not present

## 2018-04-06 ENCOUNTER — Telehealth: Payer: Self-pay | Admitting: Gastroenterology

## 2018-04-06 DIAGNOSIS — K3184 Gastroparesis: Secondary | ICD-10-CM

## 2018-04-06 DIAGNOSIS — R110 Nausea: Secondary | ICD-10-CM

## 2018-04-06 NOTE — Telephone Encounter (Signed)
Spoke with pt, she is taking the Omeprazole once daily before her breakfast. She continues to have abdominal pain and cramps that comes and goes. The nausea and lt abdominal pain can be severe at times. Anti-Nausea medication Marinol is taken twice daily and has a loss of appetite. Pt said sometimes she can barley sip on a coke without wanting to be sick.

## 2018-04-06 NOTE — Telephone Encounter (Signed)
Lmom, waiting on a return call.  

## 2018-04-06 NOTE — Telephone Encounter (Signed)
Please let patient know that Dr. Gala Romney had recommended seeing how she is doing now that she has been back on omeprazole 40mg  daily for at least four weeks. How is her LUQ pain?  If persistent symptoms, he would like [place] to send her for GI evaluation at [facility].

## 2018-04-06 NOTE — Telephone Encounter (Signed)
Please refer to GI at [facility] for abdominal pain, gastroparesis, n/v.

## 2018-04-07 NOTE — Addendum Note (Signed)
Addended by: Inge Rise on: 04/07/2018 09:35 AM   Modules accepted: Orders

## 2018-04-07 NOTE — Telephone Encounter (Signed)
Called [facility] and appt scheduled with Dr. Jerene Pitch on 04/26/18 at 1:30pm, arrival time 1:15pm, [address]. They will mail patient a packet.  Called patient and made aware of appt details. She voiced understanding. I provided her with phone # as well in case she is unable to make this appt. Records have been faxed.

## 2018-04-07 NOTE — Telephone Encounter (Signed)
Pt notified that she will be referred.

## 2018-04-22 ENCOUNTER — Telehealth: Payer: Self-pay

## 2018-04-22 NOTE — Telephone Encounter (Signed)
PA for medication Dronabinol was submitted through [email or web address]. waiting on approval or denial.

## 2018-04-26 DIAGNOSIS — R110 Nausea: Secondary | ICD-10-CM | POA: Diagnosis not present

## 2018-04-26 DIAGNOSIS — R1310 Dysphagia, unspecified: Secondary | ICD-10-CM | POA: Diagnosis not present

## 2018-04-26 DIAGNOSIS — K219 Gastro-esophageal reflux disease without esophagitis: Secondary | ICD-10-CM | POA: Diagnosis not present

## 2018-04-26 DIAGNOSIS — R1012 Left upper quadrant pain: Secondary | ICD-10-CM | POA: Diagnosis not present

## 2018-04-26 DIAGNOSIS — K222 Esophageal obstruction: Secondary | ICD-10-CM | POA: Diagnosis not present

## 2018-04-26 DIAGNOSIS — K3184 Gastroparesis: Secondary | ICD-10-CM | POA: Diagnosis not present

## 2018-04-28 ENCOUNTER — Emergency Department (HOSPITAL_COMMUNITY): Payer: Medicare HMO

## 2018-04-28 ENCOUNTER — Ambulatory Visit: Payer: Medicare HMO | Admitting: Gastroenterology

## 2018-04-28 ENCOUNTER — Observation Stay (HOSPITAL_COMMUNITY): Payer: Medicare HMO

## 2018-04-28 ENCOUNTER — Observation Stay (HOSPITAL_COMMUNITY)
Admission: EM | Admit: 2018-04-28 | Discharge: 2018-04-28 | Discharge status: Against Medical Advice | Payer: Medicare HMO | Attending: Internal Medicine | Admitting: Internal Medicine

## 2018-04-28 ENCOUNTER — Encounter (HOSPITAL_COMMUNITY): Payer: Self-pay | Admitting: Emergency Medicine

## 2018-04-28 DIAGNOSIS — R29818 Other symptoms and signs involving the nervous system: Secondary | ICD-10-CM | POA: Diagnosis not present

## 2018-04-28 DIAGNOSIS — Z5329 Procedure and treatment not carried out because of patient's decision for other reasons: Secondary | ICD-10-CM | POA: Diagnosis not present

## 2018-04-28 DIAGNOSIS — I639 Cerebral infarction, unspecified: Secondary | ICD-10-CM | POA: Diagnosis not present

## 2018-04-28 DIAGNOSIS — E785 Hyperlipidemia, unspecified: Secondary | ICD-10-CM | POA: Diagnosis not present

## 2018-04-28 DIAGNOSIS — R110 Nausea: Secondary | ICD-10-CM | POA: Diagnosis present

## 2018-04-28 DIAGNOSIS — J449 Chronic obstructive pulmonary disease, unspecified: Secondary | ICD-10-CM | POA: Diagnosis not present

## 2018-04-28 DIAGNOSIS — R29810 Facial weakness: Secondary | ICD-10-CM | POA: Diagnosis present

## 2018-04-28 DIAGNOSIS — R0689 Other abnormalities of breathing: Secondary | ICD-10-CM | POA: Diagnosis not present

## 2018-04-28 DIAGNOSIS — Z8673 Personal history of transient ischemic attack (TIA), and cerebral infarction without residual deficits: Secondary | ICD-10-CM | POA: Diagnosis not present

## 2018-04-28 DIAGNOSIS — K219 Gastro-esophageal reflux disease without esophagitis: Secondary | ICD-10-CM | POA: Diagnosis not present

## 2018-04-28 DIAGNOSIS — R4781 Slurred speech: Secondary | ICD-10-CM | POA: Diagnosis not present

## 2018-04-28 DIAGNOSIS — F17210 Nicotine dependence, cigarettes, uncomplicated: Secondary | ICD-10-CM | POA: Insufficient documentation

## 2018-04-28 DIAGNOSIS — R4701 Aphasia: Secondary | ICD-10-CM | POA: Diagnosis present

## 2018-04-28 DIAGNOSIS — R259 Unspecified abnormal involuntary movements: Secondary | ICD-10-CM | POA: Diagnosis not present

## 2018-04-28 DIAGNOSIS — K760 Fatty (change of) liver, not elsewhere classified: Secondary | ICD-10-CM | POA: Diagnosis not present

## 2018-04-28 DIAGNOSIS — R000 Tachycardia, unspecified: Secondary | ICD-10-CM | POA: Diagnosis not present

## 2018-04-28 DIAGNOSIS — R2990 Unspecified symptoms and signs involving the nervous system: Secondary | ICD-10-CM | POA: Diagnosis present

## 2018-04-28 DIAGNOSIS — G459 Transient cerebral ischemic attack, unspecified: Secondary | ICD-10-CM | POA: Diagnosis not present

## 2018-04-28 DIAGNOSIS — Z79899 Other long term (current) drug therapy: Secondary | ICD-10-CM | POA: Insufficient documentation

## 2018-04-28 DIAGNOSIS — Z79890 Hormone replacement therapy (postmenopausal): Secondary | ICD-10-CM | POA: Insufficient documentation

## 2018-04-28 DIAGNOSIS — I10 Essential (primary) hypertension: Secondary | ICD-10-CM | POA: Diagnosis not present

## 2018-04-28 LAB — DIFFERENTIAL
ABS IMMATURE GRANULOCYTES: 0.02 10*3/uL (ref 0.00–0.07)
Basophils Absolute: 0.1 10*3/uL (ref 0.0–0.1)
Basophils Relative: 1 %
Eosinophils Absolute: 0.2 10*3/uL (ref 0.0–0.5)
Eosinophils Relative: 2 %
Immature Granulocytes: 0 %
Lymphocytes Relative: 22 %
Lymphs Abs: 2.1 10*3/uL (ref 0.7–4.0)
Monocytes Absolute: 0.5 10*3/uL (ref 0.1–1.0)
Monocytes Relative: 6 %
Neutro Abs: 6.5 10*3/uL (ref 1.7–7.7)
Neutrophils Relative %: 69 %

## 2018-04-28 LAB — CBC
HCT: 44.2 % (ref 36.0–46.0)
HEMOGLOBIN: 15.2 g/dL — AB (ref 12.0–15.0)
MCH: 29 pg (ref 26.0–34.0)
MCHC: 34.4 g/dL (ref 30.0–36.0)
MCV: 84.4 fL (ref 80.0–100.0)
Platelets: 237 10*3/uL (ref 150–400)
RBC: 5.24 MIL/uL — ABNORMAL HIGH (ref 3.87–5.11)
RDW: 14.1 % (ref 11.5–15.5)
WBC: 9.3 10*3/uL (ref 4.0–10.5)
nRBC: 0 % (ref 0.0–0.2)

## 2018-04-28 LAB — COMPREHENSIVE METABOLIC PANEL
ALT: 16 U/L (ref 0–44)
AST: 20 U/L (ref 15–41)
Albumin: 4.9 g/dL (ref 3.5–5.0)
Alkaline Phosphatase: 95 U/L (ref 38–126)
Anion gap: 10 (ref 5–15)
BUN: 5 mg/dL — ABNORMAL LOW (ref 8–23)
CO2: 21 mmol/L — AB (ref 22–32)
Calcium: 9.7 mg/dL (ref 8.9–10.3)
Chloride: 107 mmol/L (ref 98–111)
Creatinine, Ser: 0.73 mg/dL (ref 0.44–1.00)
GFR calc Af Amer: >60 mL/min (ref >60)
GFR calc non Af Amer: >60 mL/min (ref >60)
GLUCOSE: 130 mg/dL — AB (ref 70–99)
Potassium: 3 mmol/L — ABNORMAL LOW (ref 3.5–5.1)
SODIUM: 138 mmol/L (ref 135–145)
Total Bilirubin: 0.8 mg/dL (ref 0.3–1.2)
Total Protein: 7.9 g/dL (ref 6.5–8.1)

## 2018-04-28 LAB — I-STAT CHEM 8, ED
CHLORIDE: 106 mmol/L (ref 98–111)
Calcium, Ion: 1.19 mmol/L (ref 1.15–1.40)
Creatinine, Ser: 0.7 mg/dL (ref 0.44–1.00)
Glucose, Bld: 128 mg/dL — ABNORMAL HIGH (ref 70–99)
HCT: 46 % (ref 36.0–46.0)
Hemoglobin: 15.6 g/dL — ABNORMAL HIGH (ref 12.0–15.0)
Potassium: 3.1 mmol/L — ABNORMAL LOW (ref 3.5–5.1)
Sodium: 140 mmol/L (ref 135–145)
TCO2: 23 mmol/L (ref 22–32)

## 2018-04-28 LAB — CBG MONITORING, ED: Glucose-Capillary: 120 mg/dL — ABNORMAL HIGH (ref 70–99)

## 2018-04-28 LAB — PROTIME-INR
INR: 1.05
Prothrombin Time: 13.6 seconds (ref 11.4–15.2)

## 2018-04-28 LAB — I-STAT TROPONIN, ED: Troponin i, poc: 0 ng/mL (ref 0.00–0.08)

## 2018-04-28 LAB — APTT: aPTT: 33 seconds (ref 24–36)

## 2018-04-28 MED ORDER — HEPARIN SODIUM (PORCINE) 5000 UNIT/ML IJ SOLN: 5000.0000 [IU] | Freq: Three times a day (TID) | INTRAMUSCULAR | Status: DC

## 2018-04-28 MED ORDER — LORAZEPAM 1 MG PO TABS
1.0000 mg | ORAL_TABLET | Freq: Once | ORAL | Status: AC
  Administered 2018-04-28: 1 mg via ORAL
  Filled 2018-04-28: qty 1

## 2018-04-28 MED ORDER — POTASSIUM CHLORIDE CRYS ER 20 MEQ PO TBCR
40.0000 meq | EXTENDED_RELEASE_TABLET | Freq: Once | ORAL | Status: AC
  Administered 2018-04-28: 40 meq via ORAL
  Filled 2018-04-28: qty 2

## 2018-04-28 MED ORDER — METOCLOPRAMIDE HCL 5 MG/ML IJ SOLN: 10.0000 mg | Freq: Once | INTRAMUSCULAR | Status: DC

## 2018-04-28 MED ORDER — HALOPERIDOL LACTATE 5 MG/ML IJ SOLN
2.0000 mg | Freq: Once | INTRAMUSCULAR | Status: AC
  Administered 2018-04-28: 2 mg via INTRAVENOUS
  Filled 2018-04-28: qty 1

## 2018-04-28 NOTE — ED Provider Notes (Signed)
[facility] EMERGENCY DEPARTMENT Provider Note   CSN: [id number] Arrival date & time: 04/28/18  0556     History   Chief Complaint Chief Complaint  Patient presents with  . Aphasia    HPI Tracey Mccullough is a 73 y.o. female.  The history is provided by the EMS personnel and the patient. The history is limited by the condition of the patient (Aphasia).  She has history of COPD, hypertension, hyperlipidemia, stroke and is brought into the ED as a code stroke.  She was last known normal at 2 AM.  Husband noted slurred speech and called EMS.  Patient is without complaints.  Past Medical History:  Diagnosis Date  . Acid reflux   . Anxiety   . Arthritis   . COPD (chronic obstructive pulmonary disease) ([place])   . Depression   . Gastroparesis    INTOLERANT TO REGLAN; On marinol  . GERD (gastroesophageal reflux disease)   . Hemorrhoids 09/01/2011   external  . History of kidney stones   . Hypercholesterolemia   . Hypertension   . Hypokalemia 11/01/10   requiring admission  . Hypothyroidism   . Irritable bowel syndrome (IBS)   . Restless leg syndrome   . Schatzki's ring    EGD 1/12  . Stroke ([place]) 2001   No deficits  . Thyroid disease     Patient Active Problem List   Diagnosis Date Noted  . LUQ pain 03/01/2018  . N&V (nausea and vomiting) 03/05/2017  . Dizziness 03/05/2017  . Chest pain 04/03/2016  . Abnormal LFTs 08/24/2012  . Leukocytosis, unspecified 08/24/2012  . Dysphagia 08/11/2011  . Headache(784.0) 04/03/2011  . Fatty liver 04/03/2011  . Family history of colon cancer 04/03/2011  . COPD (chronic obstructive pulmonary disease) ([place]) 11/01/2010  . CVA (cerebral vascular accident) ([place]) 11/01/2010  . Abdominal pain, epigastric 09/19/2009  . Constipation 08/04/2008  . HEMORRHOIDS, EXTERNAL 12/09/2007  . GERD, SEVERE 12/09/2007  . Gastroparesis 12/09/2007  . HIATAL HERNIA 12/09/2007  . Other fatigue 12/09/2007  . Nausea without vomiting 12/09/2007  .  ABDOMINAL BLOATING 12/09/2007    Past Surgical History:  Procedure Laterality Date  . BIOPSY  07/14/2016   Procedure: BIOPSY;  Surgeon: Daneil Dolin, MD;  Location: [facility] ENDO SUITE;  Service: Endoscopy;;  gastric  . CARPAL TUNNEL RELEASE     bilateral  . CHOLECYSTECTOMY  1978  . COLONOSCOPY  09/2006   hemorrhoids  . COLONOSCOPY  09/01/2011   Dr. Camillia Herter hemorrhoids, tubular adenoma. next tcs 08/2016  . COLONOSCOPY WITH PROPOFOL N/A 07/14/2016   multiple adenomas, diverticulosis. Surveillance in 3 years   . ESOPHAGOGASTRODUODENOSCOPY  04/2010   Schatzki ring s/p dilation, minimal retained food. Versed 7/Demeral 150/Phenergan12.5.  . ESOPHAGOGASTRODUODENOSCOPY  09/01/2011   Dr. Leodis Binet esophageal web and schatzki's ring, small hiatal hernia, antral erosions-bx-mild chronic inflammation. circumferential distal esophageal erosions  . ESOPHAGOGASTRODUODENOSCOPY (EGD) WITH PROPOFOL N/A 07/14/2016   mild Schatki's ring s/p dilation, small hiatal hernia, mild chronic inactive gastritis, negative H.pylori  . excision of axilliary sweat glands Bilateral   . FRACTURE SURGERY  2011   right arm  . KIDNEY STONE SURGERY  2008  . MALONEY DILATION N/A 07/14/2016   Procedure: Venia Minks DILATION;  Surgeon: Daneil Dolin, MD;  Location: [facility] ENDO [facility];  Service: Endoscopy;  Laterality: N/A;  . POLYPECTOMY  07/14/2016   Procedure: POLYPECTOMY;  Surgeon: Daneil Dolin, MD;  Location: [facility] END[facility];  Service: Endoscopy;;  colon  . [place]  OB History   No obstetric history on file.      Home Medications    Prior to Admission medications   Medication Sig Start Date End Date Taking? Authorizing Provider  albuterol (PROAIR HFA) 108 (90 Base) MCG/ACT inhaler Inhale 2 puffs into the lungs every 6 (six) hours as needed for wheezing or shortness of breath.    [provider]  ALPRAZolam Duanne Moron) 0.5 MG tablet Take 0.5 mg by mouth at bedtime as needed for anxiety.    [provider]  amLODipine-benazepril (LOTREL) 10-20 MG per capsule Take 1 capsule by mouth daily before breakfast.     [provider]  clopidogrel (PLAVIX) 75 MG tablet Take 75 mg by mouth at bedtime.     [provider]  dronabinol (MARINOL) 2.5 MG capsule TAKE 1 CAPSULE BY MOUTH 2 TIMES A DAY 12/18/17   Mahala Menghini, PA-C  HYDROcodone-acetaminophen (NORCO) 10-325 MG per tablet Take 1 tablet by mouth every 6 (six) hours as needed (for pain.).  12/08/13   [provider]  hyoscyamine (LEVSIN, ANASPAZ) 0.125 MG tablet DISSOLVE 1 TABLET UNDER THE TONGUE EVERY 4 HOURS AS NEEDED FOR CRAMPING 05/19/17   Mahala Menghini, PA-C  levothyroxine (SYNTHROID, LEVOTHROID) 75 MCG tablet Take 75 mcg by mouth daily before breakfast.    [provider]  omeprazole (PRILOSEC) 40 MG capsule TAKE 1 CAPSULE BY MOUTH EVERY DAY Patient taking differently: every other day.  02/24/18   Carlis Stable, NP  potassium chloride (K-DUR) 10 MEQ tablet Take 10 mEq by mouth 2 (two) times daily.     [provider]  pravastatin (PRAVACHOL) 20 MG tablet Take 20 mg by mouth daily before breakfast.     [provider]  rOPINIRole (REQUIP) 1 MG tablet Take 1 mg at bedtime by mouth. 02/19/17   [provider]  torsemide (DEMADEX) 20 MG tablet Take 20 mg by mouth daily before breakfast.    [provider]    Family History Family History  Problem Relation Age of Onset  . Cancer Father        ?leukemia  . Cancer Sister   . Colon cancer Brother 25  . Colon cancer Sister 4       recently diagnosed    Social History Social History   Tobacco Use  . Smoking status: Current Every Day Smoker    Packs/day: 1.75    Years: 53.00    Pack years: 92.75    Types: Cigarettes  . Smokeless tobacco: Never Used  Substance Use Topics  . Alcohol use: No  . Drug use: No     Allergies   Metoclopramide hcl; Penicillins; Zofran; Compazine [prochlorperazine edisylate];  Phenergan [promethazine hcl]; and Reglan [metoclopramide]   Review of Systems Review of Systems  All other systems reviewed and are negative.    Physical Exam Updated Vital Signs BP 135/65   Pulse 87   Resp (!) 22   SpO2 98%   Physical Exam Vitals signs and nursing note reviewed.    73 year old female, resting comfortably and in no acute distress. Vital signs are significant for increased respiratory rate. Oxygen saturation is 98%, which is normal. Head is normocephalic and atraumatic. PERRLA, EOMI. Oropharynx is clear. Neck is nontender and supple without adenopathy or JVD.  There are no carotid bruits. Back is nontender and there is no CVA tenderness. Lungs are clear without rales, wheezes, or rhonchi. Chest is nontender. Heart has regular rate and rhythm  without murmur. Abdomen is soft, flat, nontender without masses or hepatosplenomegaly and peristalsis is normoactive. Extremities have no cyanosis or edema, full range of motion is present. Skin is warm and dry without rash. Neurologic: Awake and alert with moderate expressive a aphasia.  Speech is slightly dysarthric and she is slow to think of the proper words to say and is nonfluent.  Cranial nerves are intact, strength is 5/5 in both arms and both legs, there is no pronator drift, moderately severe resting tremors present.  No objective sensory deficits.  ED Treatments / Results  Labs (all labs ordered are listed, but only abnormal results are displayed) Labs Reviewed  CBC - Abnormal; Notable for the following components:      Result Value   RBC 5.24 (*)    Hemoglobin 15.2 (*)    All other components within normal limits  COMPREHENSIVE METABOLIC PANEL - Abnormal; Notable for the following components:   Potassium 3.0 (*)    CO2 21 (*)    Glucose, Bld 130 (*)    BUN 5 (*)    All other components within normal limits  CBG MONITORING, ED - Abnormal; Notable for the following components:   Glucose-Capillary 120 (*)      All other components within normal limits  I-STAT CHEM 8, ED - Abnormal; Notable for the following components:   Potassium 3.1 (*)    BUN <3 (*)    Glucose, Bld 128 (*)    Hemoglobin 15.6 (*)    All other components within normal limits  PROTIME-INR  APTT  DIFFERENTIAL  I-STAT TROPONIN, ED    EKG EKG Interpretation  Date/Time:  Wednesday April 28 2018 06:14:23 EST Ventricular Rate:  82 PR Interval:    QRS Duration: 88 QT Interval:  383 QTC Calculation: 448 R Axis:   62 Text Interpretation:  Sinus rhythm Normal ECG When compared with ECG of 03/01/2017, No significant change was found Confirmed by Delora Fuel ([id number]) on 04/28/2018 6:29:30 AM   Radiology Ct Head Code Stroke Wo Contrast  Result Date: 04/28/2018 CLINICAL DATA:  Code stroke.  Slurred speech EXAM: CT HEAD WITHOUT CONTRAST TECHNIQUE: Contiguous axial images were obtained from the base of the skull through the vertex without intravenous contrast. COMPARISON:  05/17/2008 FINDINGS: Brain: No evidence of acute infarction, hemorrhage, hydrocephalus, extra-axial collection or mass lesion/mass effect. Moderate chronic small vessel ischemia in the cerebral white matter Vascular: No hyperdense vessel or unexpected calcification. Skull: Normal. Negative for fracture or focal lesion. Sinuses/Orbits: No acute finding. Other: These results were called by telephone at the time of interpretation on 04/28/2018 at 6:06 am to Dr. Delora Fuel , who verbally acknowledged these results. ASPECTS [facility] Stroke Program Early CT Score) - Ganglionic level infarction (caudate, lentiform nuclei, internal capsule, insula, M1-M3 cortex): 7 - Supraganglionic infarction (M4-M6 cortex): 3 Total score (0-10 with 10 being normal): 10 IMPRESSION: 1. No acute finding.  ASPECTS is 10. 2. Chronic small vessel ischemia the white matter. Electronically Signed   By: Monte Fantasia M.D.   On: 04/28/2018 06:07    Procedures Procedures  CRITICAL CARE Performed  by: Delora Fuel Total critical care time: 45 minutes Critical care time was exclusive of separately billable procedures and treating other patients. Critical care was necessary to treat or prevent imminent or life-threatening deterioration. Critical care was time spent personally by me on the following activities: development of treatment plan with patient and/or surrogate as well as nursing, discussions with consultants, evaluation of patient's response to treatment,  examination of patient, obtaining history from patient or surrogate, ordering and performing treatments and interventions, ordering and review of laboratory studies, ordering and review of radiographic studies, pulse oximetry and re-evaluation of patient's condition.  Medications Ordered in ED Medications - No data to display   Initial Impression / Assessment and Plan / ED Course  I have reviewed the triage vital signs and the nursing notes.  Pertinent labs & imaging results that were available during my care of the patient were reviewed by me and considered in my medical decision making (see chart for details).  Possible stroke with a aphasia.  She is within the timeframe where thrombolytics might be given, but deficits are too mild to warrant thrombolytics.  Will get neurology consult.  Neurology consult appreciated.  They were able to appreciate decrease in station left lower leg, and bilateral leg drift.  This does not fit neatly into 1 stroke lesion.  CT shows no acute process.  ECG is unremarkable.  Will admit for stroke work-up.  She is noted to be hypokalemic and is given potassium replacement.  Case is discussed with Dr. Dyann Kief of [facility] hospitalist, who agrees to admit the patient.  Final Clinical Impressions(s) / ED Diagnoses   Final diagnoses:  Cerebrovascular accident (CVA), unspecified mechanism [facility])    ED Discharge Orders    None       Delora Fuel, MD 53/64/68 [phone]

## 2018-04-28 NOTE — ED Notes (Signed)
Dr. Dyann Kief made aware of pt leaving AMA.

## 2018-04-28 NOTE — Progress Notes (Signed)
Code stroke  Call time  547 am [id number]   [id number]   [address]
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
## 2018-04-28 NOTE — ED Notes (Signed)
TeleNeurologist interviewing Pt at this time.

## 2018-04-28 NOTE — Consult Note (Signed)
[facility] Tele[facility]urology Consult Services   Date of Service:   04/28/2018 06:16:49  Impression:     .  RO Acute Ischemic Stroke     .  Right Hemispheric Infarct     .  slurred speech, B/L LE drift, and decreased sensation LLE- mostly nonfocal exam except for decreased sensation in LLE, which could indicate R hemispheric stroke  Comments: Differential Diagnosis: 1. Cardioembolic stroke 2. Small vessel disease/lacune 3. Thromboembolic, artery-to-artery mechanism 4. Hypercoagulable state-related infarct 5. Transient ischemic attack 6. Thrombotic mechanism, large artery disease  Mechanism of Stroke: Not Clear  Metrics: Last Known Well: 04/28/2018 02:00:00 [facility] Notification Time: 04/28/2018 06:16:49 Arrival Time: 04/28/2018 05:56:00 Stamp Time: 04/28/2018 06:16:49 Time First Login Attempt: 04/28/2018 06:25:03 Video Start Time: 04/28/2018 06:25:03  Symptoms: difficulty speaking NIHSS Start Assessment Time: 04/28/2018 06:28:46 Patient is not a candidate for tPA. Patient was not deemed candidate for tPA thrombolytics because of Last Well Known Above 4.5 Hours. Video End Time: 04/28/2018 06:47:00  CT head showed no acute hemorrhage or acute core infarct.  Advanced imaging was not obtained as the presentation was not suggestive of Large Vessel Occlusive Disease.   ER Physician notified of the decision on thrombolytics management on 04/28/2018 06:48:00  Our recommendations are outlined below.  Recommendations:     .  Activate Stroke Protocol Admission/Order Set     .  Stroke/Telemetry Floor     .  Neuro Checks     .  Bedside Swallow Eval     .  DVT Prophylaxis     .  IV Fluids, Normal Saline     .  Head of Bed Below 30 Degrees     .  Euglycemia and Avoid Hyperthermia (PRN Acetaminophen)     .  Antiplatelet Therapy Recommended     .  Start Antiplatelet Therapy Daily  Routine Consultation with [facility] for Follow up Care  Sign Out:   .  Discussed with Emergency Department Provider    ------------------------------------------------------------------------------  History of Present Illness: Patient is a 73 year old Female.  Patient was brought by EMS for symptoms of difficulty speaking  73 year old woman presents with ?facial droop and slurred speech. At 10 pm last night, she was nauseous (she has history of gastroparesis). She was last seen normal by her husband at 2 AM. At 5 AM, he noticed the slurred speech and difficulty speaking. She does not know when the symptoms began. She has been having more headaches as well.  CT head showed no acute hemorrhage or acute core infarct.    Examination: 1A: Level of Consciousness - Alert; keenly responsive + 0 1B: Ask Month and Age - Both Questions Right + 0 1C: Blink Eyes & Squeeze Hands - Performs Both Tasks + 0 2: Test Horizontal Extraocular Movements - Normal + 0 3: Test Visual Fields - No Visual Loss + 0 4: Test Facial Palsy (Use Grimace if Obtunded) - Normal symmetry + 0 5A: Test Left Arm Motor Drift - No Drift for 10 Seconds + 0 5B: Test Right Arm Motor Drift - No Drift for 10 Seconds + 0 6A: Test Left Leg Motor Drift - Drift, but doesn't hit bed + 1 6B: Test Right Leg Motor Drift - Drift, but doesn't hit bed + 1 7: Test Limb Ataxia (FNF/Heel-Shin) - No Ataxia + 0 8: Test Sensation - Mild-Moderate Loss: Less Sharp/More Dull + 1 9: Test Language/Aphasia - Normal; No aphasia + 0 10: Test Dysarthria - Mild-Moderate Dysarthria:  Slurring but can be understood + 1 11: Test Extinction/Inattention - No abnormality + 0  NIHSS Score: 4  Patient was informed the Neurology Consult would happen via TeleHealth consult by way of interactive audio and video telecommunications and consented to receiving care in this manner.  Due to the immediate potential for life-threatening deterioration due to underlying acute neurologic illness, I spent 35 minutes providing critical care.  This time includes time for face to face visit via telemedicine, review of medical records, imaging studies and discussion of findings with providers, the patient and/or family.   Dr Uvaldo Bristle   [facility] [phone]  Case [id number]

## 2018-04-28 NOTE — ED Notes (Signed)
ED Provider at bedside. 

## 2018-04-28 NOTE — ED Triage Notes (Signed)
Pt here by [facility] with slurred speech, reports pt has had previous TIA but is unsure when, LKW 0200, BP 156/96, CBG 128

## 2018-04-28 NOTE — ED Notes (Signed)
Patient transported to MRI 

## 2018-04-28 NOTE — ED Notes (Signed)
[place] paged @ [phone]

## 2018-04-30 DIAGNOSIS — K3184 Gastroparesis: Secondary | ICD-10-CM | POA: Diagnosis not present

## 2018-04-30 DIAGNOSIS — J449 Chronic obstructive pulmonary disease, unspecified: Secondary | ICD-10-CM | POA: Diagnosis not present

## 2018-04-30 DIAGNOSIS — R109 Unspecified abdominal pain: Secondary | ICD-10-CM | POA: Diagnosis not present

## 2018-04-30 DIAGNOSIS — I10 Essential (primary) hypertension: Secondary | ICD-10-CM | POA: Diagnosis not present

## 2018-05-07 ENCOUNTER — Telehealth: Payer: Self-pay | Admitting: Internal Medicine

## 2018-05-07 NOTE — Telephone Encounter (Signed)
Pt is having an EGD at [facility] and they would like her to hold her reflux med 5 days prior to procedure. Please advise.

## 2018-05-07 NOTE — Telephone Encounter (Signed)
Pt said her doctor at [facility] wanted to know if she could stop her Omeprazole 5 days prior to her test she's having done. Please advise. [phone]

## 2018-05-07 NOTE — Telephone Encounter (Signed)
Noted. Pt notified.  

## 2018-05-07 NOTE — Telephone Encounter (Signed)
Ok to hold per [facility] recommendations.

## 2018-05-18 DIAGNOSIS — R110 Nausea: Secondary | ICD-10-CM | POA: Diagnosis not present

## 2018-05-18 DIAGNOSIS — K3184 Gastroparesis: Secondary | ICD-10-CM | POA: Diagnosis not present

## 2018-05-18 DIAGNOSIS — K3189 Other diseases of stomach and duodenum: Secondary | ICD-10-CM | POA: Diagnosis not present

## 2018-05-20 DIAGNOSIS — K3184 Gastroparesis: Secondary | ICD-10-CM | POA: Diagnosis not present

## 2018-06-23 DIAGNOSIS — G894 Chronic pain syndrome: Secondary | ICD-10-CM | POA: Diagnosis not present

## 2018-06-23 DIAGNOSIS — Z79899 Other long term (current) drug therapy: Secondary | ICD-10-CM | POA: Diagnosis not present

## 2018-06-23 DIAGNOSIS — E559 Vitamin D deficiency, unspecified: Secondary | ICD-10-CM | POA: Diagnosis not present

## 2018-06-23 DIAGNOSIS — G8929 Other chronic pain: Secondary | ICD-10-CM | POA: Diagnosis not present

## 2018-06-23 DIAGNOSIS — M545 Low back pain: Secondary | ICD-10-CM | POA: Diagnosis not present

## 2018-06-23 DIAGNOSIS — M129 Arthropathy, unspecified: Secondary | ICD-10-CM | POA: Diagnosis not present

## 2018-06-30 DIAGNOSIS — H903 Sensorineural hearing loss, bilateral: Secondary | ICD-10-CM | POA: Diagnosis not present

## 2018-06-30 DIAGNOSIS — H60543 Acute eczematoid otitis externa, bilateral: Secondary | ICD-10-CM | POA: Diagnosis not present

## 2018-07-07 DIAGNOSIS — M539 Dorsopathy, unspecified: Secondary | ICD-10-CM | POA: Diagnosis not present

## 2018-07-07 DIAGNOSIS — G894 Chronic pain syndrome: Secondary | ICD-10-CM | POA: Diagnosis not present

## 2018-07-07 DIAGNOSIS — E559 Vitamin D deficiency, unspecified: Secondary | ICD-10-CM | POA: Diagnosis not present

## 2018-07-07 DIAGNOSIS — Z79899 Other long term (current) drug therapy: Secondary | ICD-10-CM | POA: Diagnosis not present

## 2018-07-08 ENCOUNTER — Ambulatory Visit: Payer: Medicare HMO | Admitting: Gastroenterology

## 2018-08-26 DIAGNOSIS — E559 Vitamin D deficiency, unspecified: Secondary | ICD-10-CM | POA: Diagnosis not present

## 2018-08-26 DIAGNOSIS — Z79899 Other long term (current) drug therapy: Secondary | ICD-10-CM | POA: Diagnosis not present

## 2018-08-26 DIAGNOSIS — M539 Dorsopathy, unspecified: Secondary | ICD-10-CM | POA: Diagnosis not present

## 2018-08-26 DIAGNOSIS — G894 Chronic pain syndrome: Secondary | ICD-10-CM | POA: Diagnosis not present

## 2018-09-08 DIAGNOSIS — Z961 Presence of intraocular lens: Secondary | ICD-10-CM | POA: Diagnosis not present

## 2018-09-08 DIAGNOSIS — I10 Essential (primary) hypertension: Secondary | ICD-10-CM | POA: Diagnosis not present

## 2018-09-08 DIAGNOSIS — G8929 Other chronic pain: Secondary | ICD-10-CM | POA: Diagnosis not present

## 2018-09-08 DIAGNOSIS — R69 Illness, unspecified: Secondary | ICD-10-CM | POA: Diagnosis not present

## 2018-09-08 DIAGNOSIS — H40013 Open angle with borderline findings, low risk, bilateral: Secondary | ICD-10-CM | POA: Diagnosis not present

## 2018-09-08 DIAGNOSIS — J449 Chronic obstructive pulmonary disease, unspecified: Secondary | ICD-10-CM | POA: Diagnosis not present

## 2018-09-08 DIAGNOSIS — H3589 Other specified retinal disorders: Secondary | ICD-10-CM | POA: Diagnosis not present

## 2018-09-15 DIAGNOSIS — M539 Dorsopathy, unspecified: Secondary | ICD-10-CM | POA: Diagnosis not present

## 2018-09-15 DIAGNOSIS — E039 Hypothyroidism, unspecified: Secondary | ICD-10-CM | POA: Diagnosis not present

## 2018-09-15 DIAGNOSIS — E559 Vitamin D deficiency, unspecified: Secondary | ICD-10-CM | POA: Diagnosis not present

## 2018-09-15 DIAGNOSIS — I10 Essential (primary) hypertension: Secondary | ICD-10-CM | POA: Diagnosis not present

## 2018-09-15 DIAGNOSIS — Z79899 Other long term (current) drug therapy: Secondary | ICD-10-CM | POA: Diagnosis not present

## 2018-09-15 DIAGNOSIS — G894 Chronic pain syndrome: Secondary | ICD-10-CM | POA: Diagnosis not present

## 2018-09-27 ENCOUNTER — Encounter: Payer: Self-pay | Admitting: Gastroenterology

## 2018-09-27 ENCOUNTER — Other Ambulatory Visit: Payer: Self-pay

## 2018-09-27 ENCOUNTER — Ambulatory Visit: Payer: Medicare HMO | Admitting: Gastroenterology

## 2018-09-27 DIAGNOSIS — K219 Gastro-esophageal reflux disease without esophagitis: Secondary | ICD-10-CM

## 2018-09-27 NOTE — Assessment & Plan Note (Signed)
With history of Schatzki's ring, s/p dilatation several years ago. Now with recurrent dysphagia but EGD/dilatation to be arranged at [facility], as they had seen her in consultation due to chronic N/V and abdominal pain. Interestingly, her GES done at [facility] showed a rapid gastric emptying at 2 hours and normal 4 hour gastric retention. She has had several medication adjustments to reduce polypharmacy, which has helped significantly with her overall symptoms.   Will continue Prilosec 40 mg daily for now. She is off all anti-nausea medication. We will also see her in follow-up around Feb 2021 to arrange surveillance colonoscopy.

## 2018-09-27 NOTE — Patient Instructions (Signed)
I am glad you are doing so well!  We will see you back in Feb 2021 to arrange a colonoscopy and make sure you are doing well from an upper GI standpoint.  Please keep your planned follow-up with [facility].  Please call with any concerns!  I enjoyed seeing you again today! As you know, I value our relationship and want to provide genuine, compassionate, and quality care. I welcome your feedback. If you receive a survey regarding your visit,  I greatly appreciate you taking time to fill this out. See you next time!  Annitta Needs, PhD, ANP-BC [facility]

## 2018-09-27 NOTE — Progress Notes (Signed)
Primary Care Physician:  Sinda Du, MD  Primary GI: Dr. Gala Romney   Chief Complaint  Patient presents with  . gastroparesis    doing ok    HPI:   Tracey Mccullough is a 73 y.o. female presenting today with a history of gastroparesis, fatty liver, unable to tolerate compazine, phenergan, and Reglan due to extrapyramidal side effects. Last EGD and colonoscopy was in March 2018.  She had a mild Schatzki ring status post dilation, small hiatal hernia, mild chronic inactive gastritis, negative H. pylori.  Multiple adenomas removed from the colon, diverticulosis.  Surveillance colonoscopy planned for 3 years.  She was seen by Dr. Jerene Pitch at [facility] in Jan 2020 for tertiary consultation due to abdominal pain and nausea. Underwent updated GES, EGG, pH/manometry, and recommended EGD.   GES revealed abnormally low 2 hour gastric retention, suggestive of rapid gastric emptying, normal 4 hour gastric retention without evidence of gastroparesis. Electrogastrogram completed but I do not have these results.   Has come off a lot of medication. Sent to Pain Management by Dr. Luan Pulling and started on different medications for chronic pain. No nausea medication since Jan 2020. Stopped Xanax, Marinol. Still has some nausea but not as severe, so she doesn't have to take anything. Prilosec 40 mg daily. Endoscopy still to be done in the future. Had been on hold due to [place]. Intermittent LUQ pain but not as bad as it was.   Rare constipation and uses Miralax if needed.    Past Medical History:  Diagnosis Date  . Acid reflux   . Anxiety   . Arthritis   . COPD (chronic obstructive pulmonary disease) ([place])   . Depression   . Gastroparesis    INTOLERANT TO REGLAN; On marinol  . GERD (gastroesophageal reflux disease)   . Hemorrhoids 09/01/2011   external  . History of kidney stones   . Hypercholesterolemia   . Hypertension   . Hypokalemia 11/01/10   requiring admission  . Hypothyroidism   .  Irritable bowel syndrome (IBS)   . Restless leg syndrome   . Schatzki's ring    EGD 1/12  . Stroke ([place]) 2001   No deficits  . Thyroid disease     Past Surgical History:  Procedure Laterality Date  . BIOPSY  07/14/2016   Procedure: BIOPSY;  Surgeon: Daneil Dolin, MD;  Location: [facility];  Service: Endoscopy;;  gastric  . CARPAL TUNNEL RELEASE     bilateral  . CHOLECYSTECTOMY  1978  . COLONOSCOPY  09/2006   hemorrhoids  . COLONOSCOPY  09/01/2011   Dr. Camillia Herter hemorrhoids, tubular adenoma. next tcs 08/2016  . COLONOSCOPY WITH PROPOFOL N/A 07/14/2016   multiple adenomas, diverticulosis. Surveillance in 3 years   . ESOPHAGOGASTRODUODENOSCOPY  04/2010   Schatzki ring s/p dilation, minimal retained food. Versed 7/Demeral 150/Phenergan12.5.  . ESOPHAGOGASTRODUODENOSCOPY  09/01/2011   Dr. Leodis Binet esophageal web and schatzki's ring, small hiatal hernia, antral erosions-bx-mild chronic inflammation. circumferential distal esophageal erosions  . ESOPHAGOGASTRODUODENOSCOPY (EGD) WITH PROPOFOL N/A 07/14/2016   mild Schatki's ring s/p dilation, small hiatal hernia, mild chronic inactive gastritis, negative H.pylori  . excision of axilliary sweat glands Bilateral   . FRACTURE SURGERY  2011   right arm  . KIDNEY STONE SURGERY  2008  . MALONEY DILATION N/A 07/14/2016   Procedure: Venia Minks DILATION;  Surgeon: Daneil Dolin, MD;  Location[facility];  Service: Endoscopy;  Laterality: N/A;  . POLYPECTOMY  07/14/2016   Procedure: POLYPECTOMY;  Surgeon: Daneil Dolin, MD;  Location: [facility];  Service: Endoscopy;;  colon  . TUBAL LIGATION  1972    Current Outpatient Medications  Medication Sig Dispense Refill  . amLODipine-benazepril (LOTREL) 10-20 MG per capsule Take 1 capsule by mouth daily before breakfast.     . baclofen (LIORESAL) 10 MG tablet Take 10 mg by mouth 2 (two) times daily as needed.    . cholecalciferol (VITAMIN D3) 25 MCG (1000 UT) tablet Take 1,000 Units by  mouth daily.    . clopidogrel (PLAVIX) 75 MG tablet Take 75 mg by mouth at bedtime.     Marland Kitchen HYDROcodone Bitartrate ER (HYSINGLA ER) 20 MG T24A Take 1 tablet by mouth daily.    Marland Kitchen levothyroxine (SYNTHROID, LEVOTHROID) 75 MCG tablet Take 75 mcg by mouth daily before breakfast.    . omeprazole (PRILOSEC) 40 MG capsule TAKE 1 CAPSULE BY MOUTH EVERY DAY (Patient taking differently: daily. ) 30 capsule 11  . potassium chloride (K-DUR) 10 MEQ tablet Take 10 mEq by mouth 2 (two) times daily.     . pravastatin (PRAVACHOL) 20 MG tablet Take 20 mg by mouth daily before breakfast.      No current facility-administered medications for this visit.     Allergies as of 09/27/2018 - Review Complete 09/27/2018  Allergen Reaction Noted  . Metoclopramide hcl Other (See Comments)   . Penicillins Anaphylaxis   . Zofran Other (See Comments) 11/26/2010  . Compazine [prochlorperazine edisylate]  05/26/2017  . Phenergan [promethazine hcl]  05/26/2017  . Reglan [metoclopramide]  05/26/2017    Family History  Problem Relation Age of Onset  . Cancer Father        ?leukemia  . Cancer Sister   . Colon cancer Brother 64  . Colon cancer Sister 45       recently diagnosed    Social History   Socioeconomic History  . Marital status: Married    Spouse name: Not on file  . Number of children: 3  . Years of education: Not on file  . Highest education level: Not on file  Occupational History    Employer: UNEMPLOYED  Social Needs  . Financial resource strain: Not on file  . Food insecurity:    Worry: Not on file    Inability: Not on file  . Transportation needs:    Medical: Not on file    Non-medical: Not on file  Tobacco Use  . Smoking status: Current Every Day Smoker    Packs/day: 1.75    Years: 53.00    Pack years: 92.75    Types: Cigarettes  . Smokeless tobacco: Never Used  Substance and Sexual Activity  . Alcohol use: No  . Drug use: No  . Sexual activity: Not Currently    Birth  control/protection: Post-menopausal  Lifestyle  . Physical activity:    Days per week: Not on file    Minutes per session: Not on file  . Stress: Not on file  Relationships  . Social connections:    Talks on phone: Not on file    Gets together: Not on file    Attends religious service: Not on file    Active member of club or organization: Not on file    Attends meetings of clubs or organizations: Not on file    Relationship status: Not on file  Other Topics Concern  . Not on file  Social History Narrative  . Not on file    Review of Systems: Gen:  Denies fever, chills, anorexia. Denies fatigue, weakness, weight loss.  CV: Denies chest pain, palpitations, syncope, peripheral edema, and claudication. Resp: Denies dyspnea at rest, cough, wheezing, coughing up blood, and pleurisy. GI: Denies vomiting blood, jaundice, and fecal incontinence.   Denies dysphagia or odynophagia. Derm: Denies rash, itching, dry skin Psych: Denies depression, anxiety, memory loss, confusion. No homicidal or suicidal ideation.  Heme: Denies bruising, bleeding, and enlarged lymph nodes.  Physical Exam: BP 128/71   Pulse 92   Temp (!) 96.8 F (36 C) (Oral)   Ht 5' 3.5" (1.613 m)   Wt 188 lb 9.6 oz (85.5 kg)   BMI 32.88 kg/m  General:   Alert and oriented. No distress noted. Pleasant and cooperative.  Head:  Normocephalic and atraumatic. Eyes:  Conjuctiva clear without scleral icterus. Abdomen:  +BS, soft, non-tender and non-distended. No rebound or guarding. No HSM or masses noted. Msk:  Symmetrical without gross deformities. Normal posture. Extremities:  Without edema. Neurologic:  Alert and  oriented x4 Psych:  Alert and cooperative. Normal mood and affect.

## 2018-09-28 ENCOUNTER — Encounter: Payer: Self-pay | Admitting: Internal Medicine

## 2018-09-28 NOTE — Progress Notes (Signed)
cc'ed to pcp °

## 2018-09-30 DIAGNOSIS — M539 Dorsopathy, unspecified: Secondary | ICD-10-CM | POA: Diagnosis not present

## 2018-09-30 DIAGNOSIS — G894 Chronic pain syndrome: Secondary | ICD-10-CM | POA: Diagnosis not present

## 2018-09-30 DIAGNOSIS — Z79899 Other long term (current) drug therapy: Secondary | ICD-10-CM | POA: Diagnosis not present

## 2018-10-21 DIAGNOSIS — Z79899 Other long term (current) drug therapy: Secondary | ICD-10-CM | POA: Diagnosis not present

## 2018-10-21 DIAGNOSIS — M539 Dorsopathy, unspecified: Secondary | ICD-10-CM | POA: Diagnosis not present

## 2018-10-21 DIAGNOSIS — G894 Chronic pain syndrome: Secondary | ICD-10-CM | POA: Diagnosis not present

## 2018-10-28 ENCOUNTER — Other Ambulatory Visit: Payer: Self-pay | Admitting: Nurse Practitioner

## 2018-10-28 DIAGNOSIS — M539 Dorsopathy, unspecified: Secondary | ICD-10-CM

## 2018-11-16 DIAGNOSIS — M542 Cervicalgia: Secondary | ICD-10-CM | POA: Diagnosis not present

## 2018-11-16 DIAGNOSIS — Z760 Encounter for issue of repeat prescription: Secondary | ICD-10-CM | POA: Diagnosis not present

## 2018-11-16 DIAGNOSIS — G894 Chronic pain syndrome: Secondary | ICD-10-CM | POA: Diagnosis not present

## 2018-11-16 DIAGNOSIS — Z79899 Other long term (current) drug therapy: Secondary | ICD-10-CM | POA: Diagnosis not present

## 2018-11-20 ENCOUNTER — Ambulatory Visit
Admission: RE | Admit: 2018-11-20 | Discharge: 2018-11-20 | Disposition: A | Discharge status: Home / Self Care | Payer: Medicare HMO | Source: Ambulatory Visit | Attending: Nurse Practitioner | Admitting: Nurse Practitioner

## 2018-11-20 ENCOUNTER — Other Ambulatory Visit: Payer: Self-pay

## 2018-11-20 DIAGNOSIS — M4802 Spinal stenosis, cervical region: Secondary | ICD-10-CM | POA: Diagnosis not present

## 2018-11-20 DIAGNOSIS — M539 Dorsopathy, unspecified: Secondary | ICD-10-CM

## 2018-12-07 DIAGNOSIS — I10 Essential (primary) hypertension: Secondary | ICD-10-CM | POA: Diagnosis not present

## 2018-12-07 DIAGNOSIS — M542 Cervicalgia: Secondary | ICD-10-CM | POA: Diagnosis not present

## 2018-12-07 DIAGNOSIS — E039 Hypothyroidism, unspecified: Secondary | ICD-10-CM | POA: Diagnosis not present

## 2018-12-07 DIAGNOSIS — J449 Chronic obstructive pulmonary disease, unspecified: Secondary | ICD-10-CM | POA: Diagnosis not present

## 2018-12-14 DIAGNOSIS — M1612 Unilateral primary osteoarthritis, left hip: Secondary | ICD-10-CM | POA: Diagnosis not present

## 2018-12-14 DIAGNOSIS — M5136 Other intervertebral disc degeneration, lumbar region: Secondary | ICD-10-CM | POA: Diagnosis not present

## 2018-12-14 DIAGNOSIS — M542 Cervicalgia: Secondary | ICD-10-CM | POA: Diagnosis not present

## 2018-12-14 DIAGNOSIS — M5412 Radiculopathy, cervical region: Secondary | ICD-10-CM | POA: Diagnosis not present

## 2018-12-14 DIAGNOSIS — Z79891 Long term (current) use of opiate analgesic: Secondary | ICD-10-CM | POA: Diagnosis not present

## 2018-12-16 DIAGNOSIS — G894 Chronic pain syndrome: Secondary | ICD-10-CM | POA: Diagnosis not present

## 2018-12-16 DIAGNOSIS — Z79899 Other long term (current) drug therapy: Secondary | ICD-10-CM | POA: Diagnosis not present

## 2018-12-16 DIAGNOSIS — M542 Cervicalgia: Secondary | ICD-10-CM | POA: Diagnosis not present

## 2018-12-23 DIAGNOSIS — M5412 Radiculopathy, cervical region: Secondary | ICD-10-CM | POA: Diagnosis not present

## 2019-01-05 DIAGNOSIS — H903 Sensorineural hearing loss, bilateral: Secondary | ICD-10-CM | POA: Diagnosis not present

## 2019-01-07 DIAGNOSIS — M5412 Radiculopathy, cervical region: Secondary | ICD-10-CM | POA: Diagnosis not present

## 2019-01-13 DIAGNOSIS — M5412 Radiculopathy, cervical region: Secondary | ICD-10-CM | POA: Diagnosis not present

## 2019-02-03 DIAGNOSIS — J449 Chronic obstructive pulmonary disease, unspecified: Secondary | ICD-10-CM | POA: Diagnosis not present

## 2019-02-03 DIAGNOSIS — Z85038 Personal history of other malignant neoplasm of large intestine: Secondary | ICD-10-CM | POA: Diagnosis not present

## 2019-02-03 DIAGNOSIS — G894 Chronic pain syndrome: Secondary | ICD-10-CM | POA: Diagnosis not present

## 2019-02-03 DIAGNOSIS — Z23 Encounter for immunization: Secondary | ICD-10-CM | POA: Diagnosis not present

## 2019-02-03 DIAGNOSIS — G2581 Restless legs syndrome: Secondary | ICD-10-CM | POA: Diagnosis not present

## 2019-02-03 DIAGNOSIS — I10 Essential (primary) hypertension: Secondary | ICD-10-CM | POA: Diagnosis not present

## 2019-02-03 DIAGNOSIS — E7800 Pure hypercholesterolemia, unspecified: Secondary | ICD-10-CM | POA: Diagnosis not present

## 2019-02-03 DIAGNOSIS — Z720 Tobacco use: Secondary | ICD-10-CM | POA: Diagnosis not present

## 2019-02-03 DIAGNOSIS — Z8673 Personal history of transient ischemic attack (TIA), and cerebral infarction without residual deficits: Secondary | ICD-10-CM | POA: Diagnosis not present

## 2019-02-03 DIAGNOSIS — E039 Hypothyroidism, unspecified: Secondary | ICD-10-CM | POA: Diagnosis not present

## 2019-02-07 DIAGNOSIS — G894 Chronic pain syndrome: Secondary | ICD-10-CM | POA: Diagnosis not present

## 2019-02-07 DIAGNOSIS — M539 Dorsopathy, unspecified: Secondary | ICD-10-CM | POA: Diagnosis not present

## 2019-02-07 DIAGNOSIS — Z7189 Other specified counseling: Secondary | ICD-10-CM | POA: Diagnosis not present

## 2019-02-07 DIAGNOSIS — Z79899 Other long term (current) drug therapy: Secondary | ICD-10-CM | POA: Diagnosis not present

## 2019-03-01 ENCOUNTER — Other Ambulatory Visit: Payer: Self-pay | Admitting: Nurse Practitioner

## 2019-03-08 DIAGNOSIS — G894 Chronic pain syndrome: Secondary | ICD-10-CM | POA: Diagnosis not present

## 2019-03-08 DIAGNOSIS — Z79899 Other long term (current) drug therapy: Secondary | ICD-10-CM | POA: Diagnosis not present

## 2019-03-08 DIAGNOSIS — M539 Dorsopathy, unspecified: Secondary | ICD-10-CM | POA: Diagnosis not present

## 2019-03-14 DIAGNOSIS — H40013 Open angle with borderline findings, low risk, bilateral: Secondary | ICD-10-CM | POA: Diagnosis not present

## 2019-03-16 DIAGNOSIS — Z86010 Personal history of colonic polyps: Secondary | ICD-10-CM | POA: Diagnosis not present

## 2019-03-16 DIAGNOSIS — Z8673 Personal history of transient ischemic attack (TIA), and cerebral infarction without residual deficits: Secondary | ICD-10-CM | POA: Diagnosis not present

## 2019-03-16 DIAGNOSIS — K219 Gastro-esophageal reflux disease without esophagitis: Secondary | ICD-10-CM | POA: Diagnosis not present

## 2019-04-07 DIAGNOSIS — Z79899 Other long term (current) drug therapy: Secondary | ICD-10-CM | POA: Diagnosis not present

## 2019-04-07 DIAGNOSIS — M539 Dorsopathy, unspecified: Secondary | ICD-10-CM | POA: Diagnosis not present

## 2019-04-07 DIAGNOSIS — G894 Chronic pain syndrome: Secondary | ICD-10-CM | POA: Diagnosis not present

## 2019-05-05 ENCOUNTER — Other Ambulatory Visit: Payer: Self-pay | Admitting: Gastroenterology

## 2019-05-05 DIAGNOSIS — R109 Unspecified abdominal pain: Secondary | ICD-10-CM

## 2019-05-05 DIAGNOSIS — R1012 Left upper quadrant pain: Secondary | ICD-10-CM

## 2019-05-05 DIAGNOSIS — R1013 Epigastric pain: Secondary | ICD-10-CM

## 2019-05-16 ENCOUNTER — Ambulatory Visit
Admission: RE | Admit: 2019-05-16 | Discharge: 2019-05-16 | Disposition: A | Discharge status: Home / Self Care | Payer: Medicare HMO | Source: Ambulatory Visit | Attending: Gastroenterology | Admitting: Gastroenterology

## 2019-05-16 ENCOUNTER — Other Ambulatory Visit: Payer: Self-pay

## 2019-05-16 DIAGNOSIS — R109 Unspecified abdominal pain: Secondary | ICD-10-CM

## 2019-05-16 DIAGNOSIS — R1013 Epigastric pain: Secondary | ICD-10-CM

## 2019-05-16 DIAGNOSIS — R1012 Left upper quadrant pain: Secondary | ICD-10-CM

## 2019-05-16 MED ORDER — IOPAMIDOL (ISOVUE-300) INJECTION 61%
100.0000 mL | Freq: Once | INTRAVENOUS | Status: AC | PRN
  Administered 2019-05-16: 100 mL via INTRAVENOUS

## 2019-05-21 ENCOUNTER — Other Ambulatory Visit: Payer: Self-pay | Admitting: Gastroenterology

## 2019-05-21 DIAGNOSIS — R110 Nausea: Secondary | ICD-10-CM

## 2019-05-24 ENCOUNTER — Other Ambulatory Visit: Payer: Self-pay | Admitting: *Deleted

## 2019-05-24 DIAGNOSIS — F17210 Nicotine dependence, cigarettes, uncomplicated: Secondary | ICD-10-CM

## 2019-05-24 DIAGNOSIS — Z87891 Personal history of nicotine dependence: Secondary | ICD-10-CM

## 2019-05-26 ENCOUNTER — Telehealth: Payer: Self-pay | Admitting: General Practice

## 2019-05-26 ENCOUNTER — Telehealth: Payer: Self-pay | Admitting: Internal Medicine

## 2019-05-26 ENCOUNTER — Ambulatory Visit
Admission: RE | Admit: 2019-05-26 | Discharge: 2019-05-26 | Disposition: A | Discharge status: Home / Self Care | Payer: Medicare HMO | Source: Ambulatory Visit | Attending: Gastroenterology | Admitting: Gastroenterology

## 2019-05-26 DIAGNOSIS — R110 Nausea: Secondary | ICD-10-CM

## 2019-05-26 NOTE — Telephone Encounter (Signed)
Patient called to cancel her upcoming appointment.  Michela Pitcher that she is going to a GI doctor in [place] now because her PCP  Is also in [place]

## 2019-05-26 NOTE — Telephone Encounter (Signed)
Spoke with Tracey Mccullough and advised that Dr Lindell Noe had sent a referral for Tracey Mccullough to start lung screening. Tracey Mccullough was already enrolled in lung screening back in 2017 so she just needs to get started with screening Chest Ct's again. Tracey Mccullough verbalized understanding. Nothing further needed at this time.

## 2019-05-26 NOTE — Telephone Encounter (Signed)
I understand.  Lets send her a letter to formalize.

## 2019-05-28 ENCOUNTER — Encounter: Payer: Self-pay | Admitting: General Practice

## 2019-05-28 NOTE — Telephone Encounter (Signed)
Discharge letter mailed  

## 2019-05-31 ENCOUNTER — Ambulatory Visit: Payer: Medicare HMO | Admitting: Gastroenterology

## 2019-06-01 ENCOUNTER — Ambulatory Visit
Admission: RE | Admit: 2019-06-01 | Discharge: 2019-06-01 | Disposition: A | Discharge status: Home / Self Care | Payer: Medicare HMO | Source: Ambulatory Visit | Attending: Acute Care | Admitting: Acute Care

## 2019-06-01 DIAGNOSIS — Z87891 Personal history of nicotine dependence: Secondary | ICD-10-CM

## 2019-06-01 DIAGNOSIS — F17210 Nicotine dependence, cigarettes, uncomplicated: Secondary | ICD-10-CM

## 2019-06-02 ENCOUNTER — Telehealth: Payer: Self-pay | Admitting: Acute Care

## 2019-06-02 DIAGNOSIS — F17210 Nicotine dependence, cigarettes, uncomplicated: Secondary | ICD-10-CM

## 2019-06-02 DIAGNOSIS — Z87891 Personal history of nicotine dependence: Secondary | ICD-10-CM

## 2019-06-02 NOTE — Progress Notes (Signed)
Please call patient and let them  know their  low dose Ct was read as a Lung RADS 2: nodules that are benign in appearance and behavior with a very low likelihood of becoming a clinically active cancer due to size or lack of growth. Recommendation per radiology is for a repeat LDCT in 12 months. Please let them  know we will order and schedule their  annual screening scan for 05/2020. Please let them  know there was notation of CAD on their  scan.  Please remind the patient  that this is a non-gated exam therefore degree or severity of disease  cannot be determined. Please have them  follow up with their PCP regarding potential risk factor modification, dietary therapy or pharmacologic therapy if clinically indicated. Pt.  is  currently on statin therapy. Please place order for annual  screening scan for  05/2020 and fax results to PCP. Thanks so much. 

## 2019-06-03 NOTE — Telephone Encounter (Signed)
pt calling to get test results. Pt can be reached at [phone].

## 2019-06-06 ENCOUNTER — Other Ambulatory Visit: Payer: Self-pay | Admitting: Gastroenterology

## 2019-06-06 DIAGNOSIS — R935 Abnormal findings on diagnostic imaging of other abdominal regions, including retroperitoneum: Secondary | ICD-10-CM

## 2019-06-06 NOTE — Telephone Encounter (Signed)
Pt informed of CT results per Sarah Groce, NP.  PT verbalized understanding.  Copy sent to PCP.  Order placed for 1 yr f/u CT.  

## 2019-07-04 ENCOUNTER — Other Ambulatory Visit: Payer: Self-pay

## 2019-07-04 ENCOUNTER — Ambulatory Visit
Admission: RE | Admit: 2019-07-04 | Discharge: 2019-07-04 | Disposition: A | Discharge status: Home / Self Care | Payer: Medicare HMO | Source: Ambulatory Visit | Attending: Gastroenterology | Admitting: Gastroenterology

## 2019-07-04 DIAGNOSIS — R935 Abnormal findings on diagnostic imaging of other abdominal regions, including retroperitoneum: Secondary | ICD-10-CM

## 2019-07-04 MED ORDER — GADOBENATE DIMEGLUMINE 529 MG/ML IV SOLN
17.0000 mL | Freq: Once | INTRAVENOUS | Status: AC | PRN
  Administered 2019-07-04: 17 mL via INTRAVENOUS

## 2019-09-06 ENCOUNTER — Other Ambulatory Visit: Payer: Self-pay | Admitting: Nurse Practitioner

## 2019-09-06 DIAGNOSIS — M545 Low back pain, unspecified: Secondary | ICD-10-CM

## 2019-10-08 ENCOUNTER — Ambulatory Visit
Admission: RE | Admit: 2019-10-08 | Discharge: 2019-10-08 | Disposition: A | Discharge status: Home / Self Care | Payer: Medicare HMO | Source: Ambulatory Visit | Attending: Nurse Practitioner | Admitting: Nurse Practitioner

## 2019-10-08 ENCOUNTER — Other Ambulatory Visit: Payer: Self-pay

## 2019-10-08 DIAGNOSIS — M545 Low back pain, unspecified: Secondary | ICD-10-CM

## 2019-12-11 IMAGING — CT CT HEAD CODE STROKE
3 series · 15 of 45 positions shown, 18 images · non-contrast
Comparison: 05/17/2008

CLINICAL DATA: Code stroke.  Slurred speech

EXAM:
CT HEAD WITHOUT CONTRAST
TECHNIQUE: Contiguous axial images were obtained from the base of the skull
through the vertex without intravenous contrast.

[Series 2: head code stroke wo · axial · 0.50mm/px · z∈[+88,+204]mm · 9 of 28 slices shown, 12 images]
[im 3/28  brain]
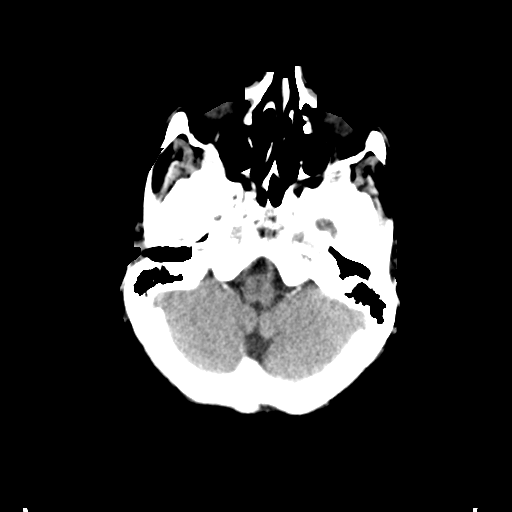
[im 3/28  bone]
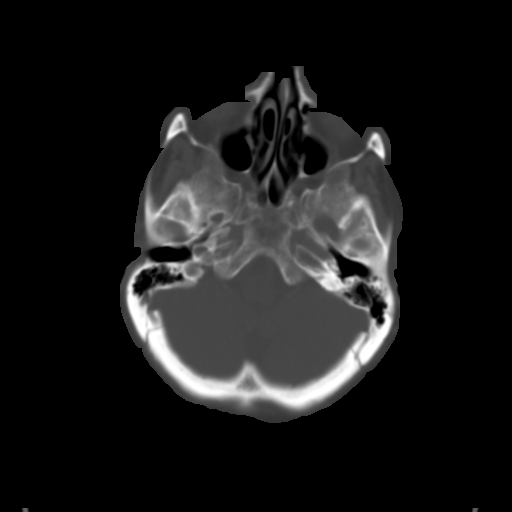
[im 6/28  brain]
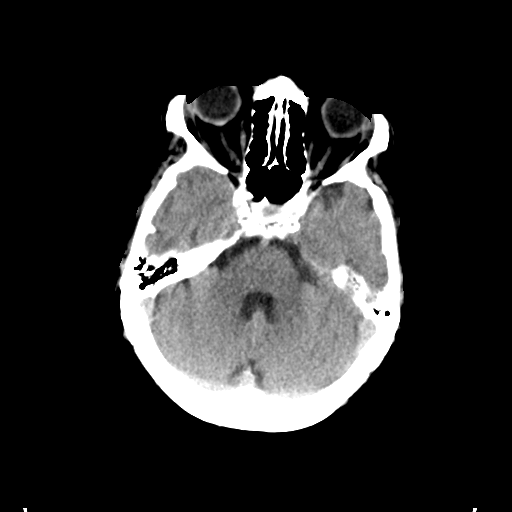
[im 9/28  brain]
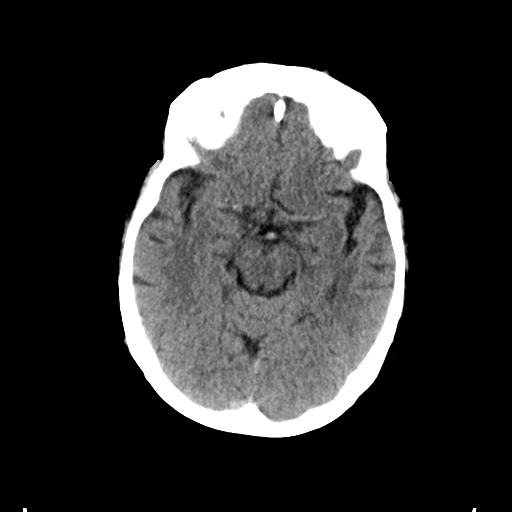
[im 12/28  brain]
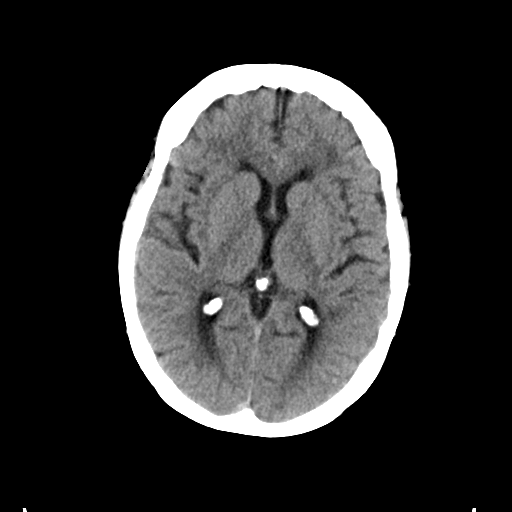
[im 15/28  brain]
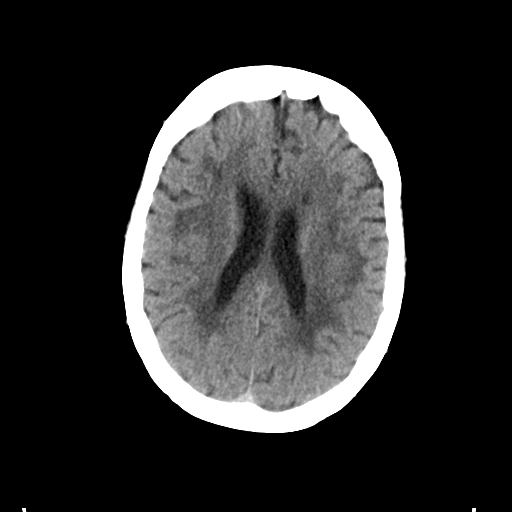
[im 15/28  bone]
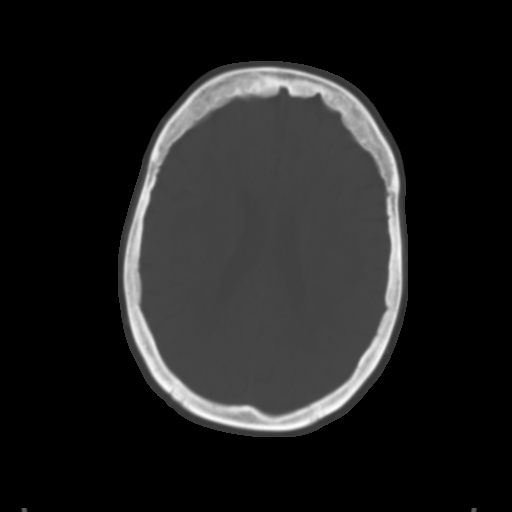
[im 17/28  brain]
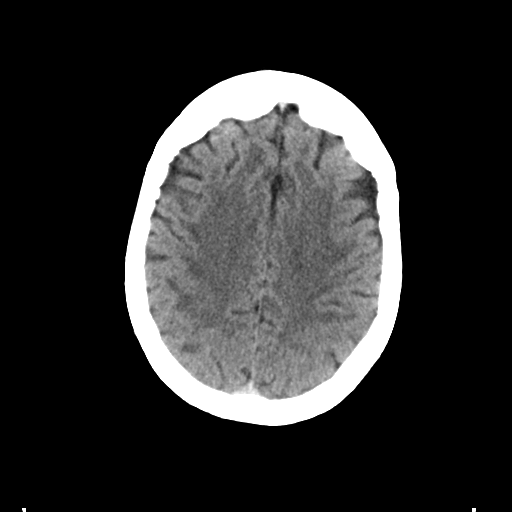
[im 20/28  brain]
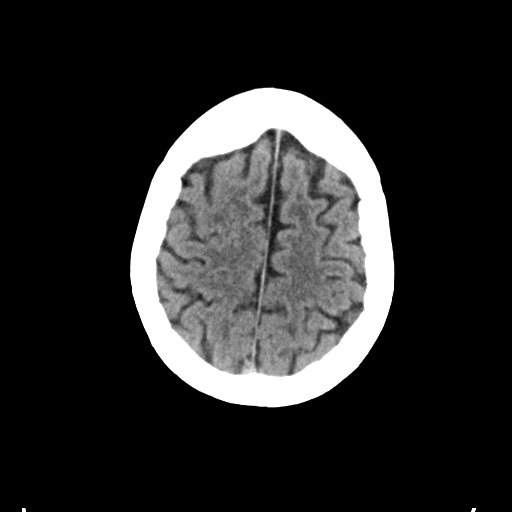
[im 23/28  brain]
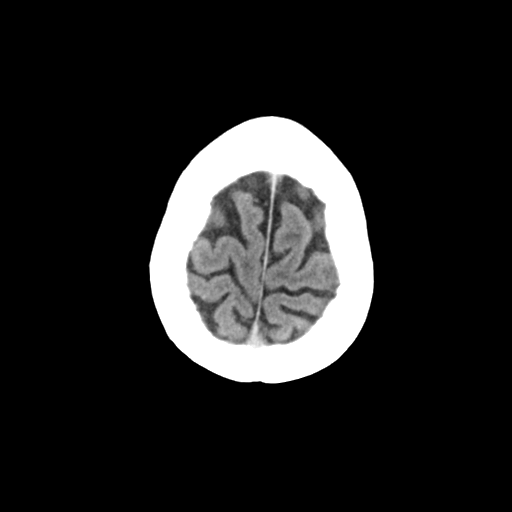
[im 26/28  brain]
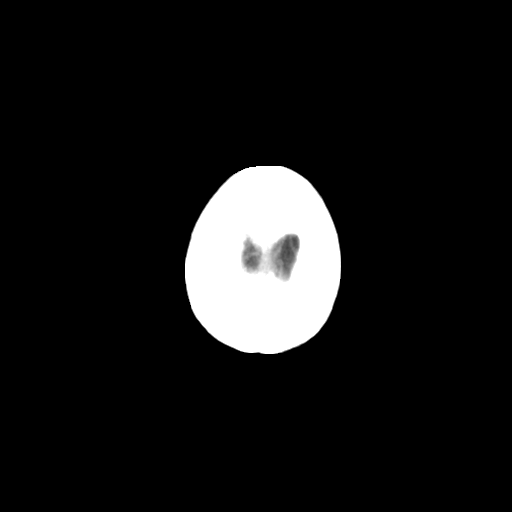
[im 26/28  bone]
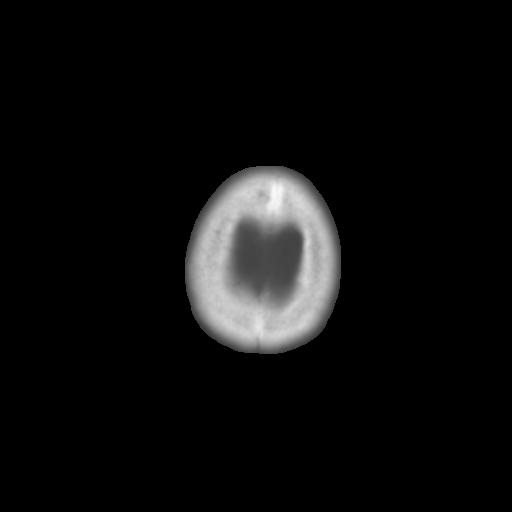

[Series 4: coronal soft tissue · coronal · 0.31mm/px · 3 of 67 slices shown]
[im 23/67  brain]
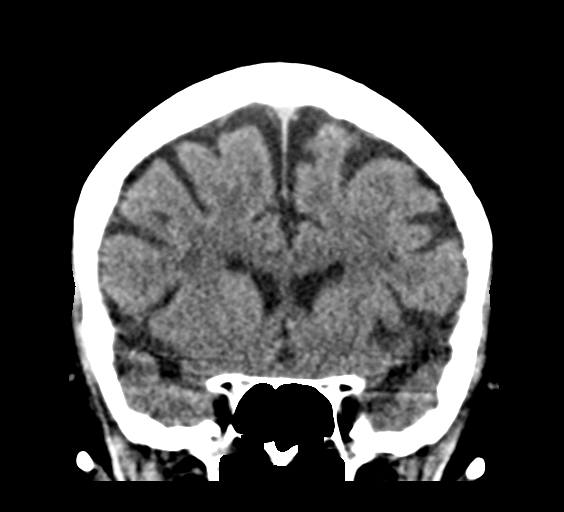
[im 30/67  brain]
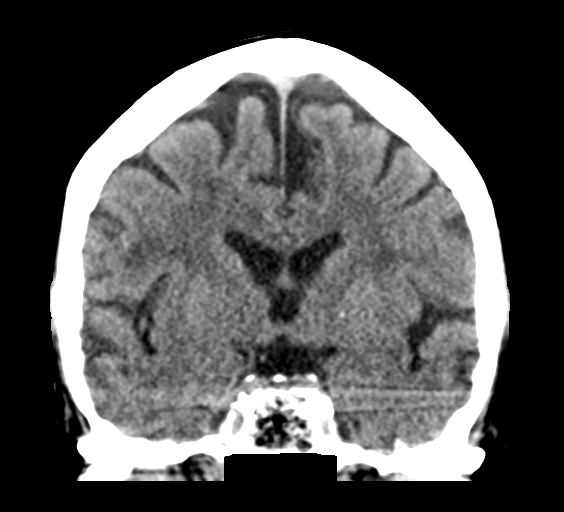
[im 37/67  brain]
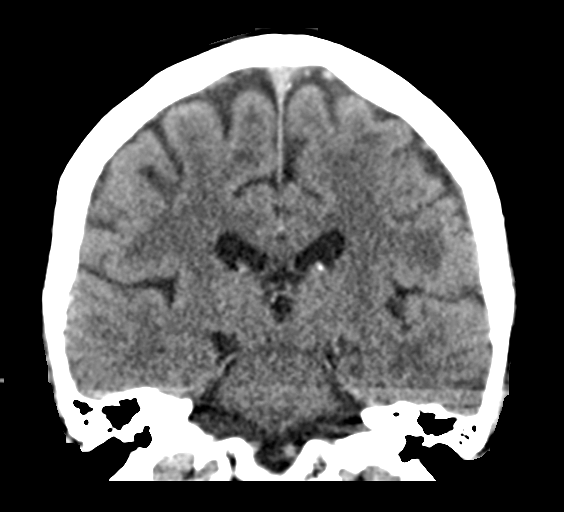

[Series 5: sagittal soft tissue · sagittal · 0.31mm/px · 3 of 53 slices shown]
[im 18/53  brain]
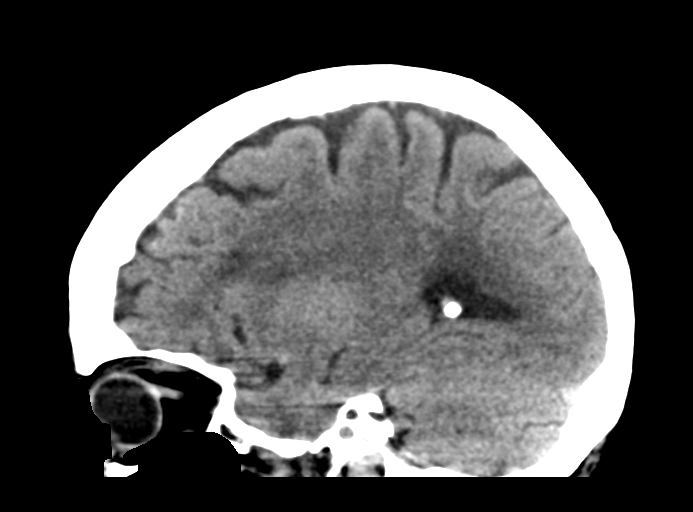
[im 27/53  brain]
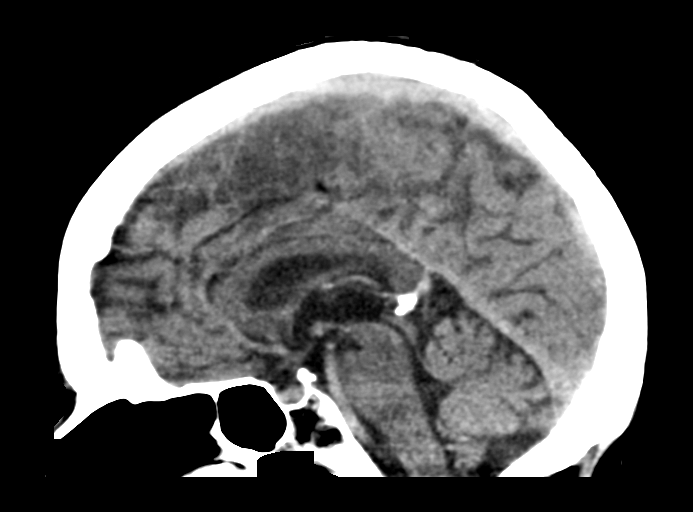
[im 35/53  brain]
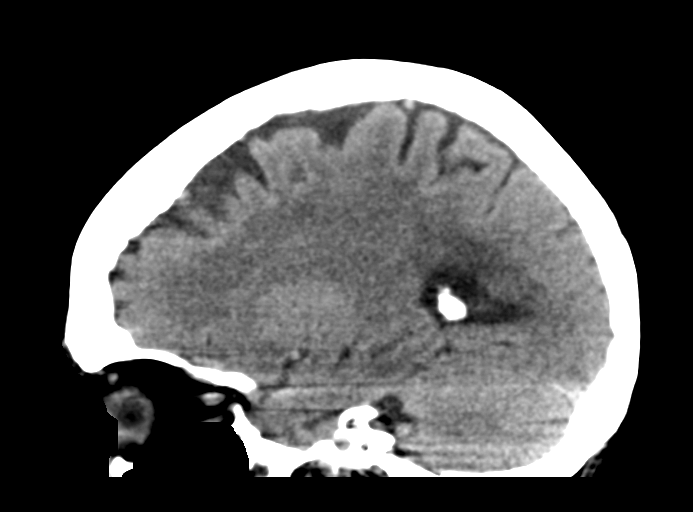

[15 of 45 positions shown; findings below may reference images not displayed]

FINDINGS: Brain: No evidence of acute infarction, hemorrhage, hydrocephalus,
extra-axial collection or mass lesion/mass effect. Moderate chronic
small vessel ischemia in the cerebral white matter

Vascular: No hyperdense vessel or unexpected calcification.

Skull: Normal. Negative for fracture or focal lesion.

Sinuses/Orbits: No acute finding.

Other: These results were called by telephone at the time of
interpretation on 04/28/2018 at [DATE] to Dr. NEGOICIC NICOTIANA , who
verbally acknowledged these results.

ASPECTS (Alberta Stroke Program Early CT Score)

- Ganglionic level infarction (caudate, lentiform nuclei, internal
capsule, insula, M1-M3 cortex): 7

- Supraganglionic infarction (M4-M6 cortex): 3

Total score (0-10 with 10 being normal): 10
IMPRESSION: 1. No acute finding.  ASPECTS is 10.
2. Chronic small vessel ischemia the white matter.

## 2020-01-13 ENCOUNTER — Other Ambulatory Visit: Payer: Self-pay | Admitting: Family Medicine

## 2020-01-13 DIAGNOSIS — Z1231 Encounter for screening mammogram for malignant neoplasm of breast: Secondary | ICD-10-CM

## 2020-01-13 DIAGNOSIS — E2839 Other primary ovarian failure: Secondary | ICD-10-CM

## 2020-02-24 ENCOUNTER — Ambulatory Visit
Admission: RE | Admit: 2020-02-24 | Discharge: 2020-02-24 | Disposition: A | Discharge status: Home / Self Care | Payer: Medicare HMO | Source: Ambulatory Visit | Attending: Family Medicine | Admitting: Family Medicine

## 2020-02-24 ENCOUNTER — Other Ambulatory Visit: Payer: Self-pay | Admitting: Family Medicine

## 2020-02-24 ENCOUNTER — Other Ambulatory Visit: Payer: Self-pay

## 2020-02-24 DIAGNOSIS — H539 Unspecified visual disturbance: Secondary | ICD-10-CM

## 2020-03-05 ENCOUNTER — Other Ambulatory Visit: Payer: Self-pay | Admitting: Gastroenterology

## 2020-04-26 ENCOUNTER — Ambulatory Visit
Admission: RE | Admit: 2020-04-26 | Discharge: 2020-04-26 | Disposition: A | Discharge status: Home / Self Care | Payer: Medicare HMO | Source: Ambulatory Visit | Attending: Family Medicine | Admitting: Family Medicine

## 2020-04-26 ENCOUNTER — Other Ambulatory Visit: Payer: Self-pay

## 2020-04-26 DIAGNOSIS — Z780 Asymptomatic menopausal state: Secondary | ICD-10-CM | POA: Diagnosis not present

## 2020-04-26 DIAGNOSIS — M85832 Other specified disorders of bone density and structure, left forearm: Secondary | ICD-10-CM | POA: Diagnosis not present

## 2020-04-26 DIAGNOSIS — M810 Age-related osteoporosis without current pathological fracture: Secondary | ICD-10-CM | POA: Diagnosis not present

## 2020-04-26 DIAGNOSIS — E2839 Other primary ovarian failure: Secondary | ICD-10-CM

## 2020-05-03 DIAGNOSIS — M539 Dorsopathy, unspecified: Secondary | ICD-10-CM | POA: Diagnosis not present

## 2020-05-03 DIAGNOSIS — Z79899 Other long term (current) drug therapy: Secondary | ICD-10-CM | POA: Diagnosis not present

## 2020-05-03 DIAGNOSIS — Z760 Encounter for issue of repeat prescription: Secondary | ICD-10-CM | POA: Diagnosis not present

## 2020-05-03 DIAGNOSIS — G894 Chronic pain syndrome: Secondary | ICD-10-CM | POA: Diagnosis not present

## 2020-05-03 DIAGNOSIS — M810 Age-related osteoporosis without current pathological fracture: Secondary | ICD-10-CM | POA: Diagnosis not present

## 2020-05-11 ENCOUNTER — Ambulatory Visit: Payer: Medicare HMO | Admitting: Neurology

## 2020-06-14 ENCOUNTER — Encounter: Payer: Self-pay | Admitting: *Deleted

## 2020-06-18 DIAGNOSIS — M539 Dorsopathy, unspecified: Secondary | ICD-10-CM | POA: Diagnosis not present

## 2020-06-18 DIAGNOSIS — F17210 Nicotine dependence, cigarettes, uncomplicated: Secondary | ICD-10-CM | POA: Diagnosis not present

## 2020-06-18 DIAGNOSIS — Z79899 Other long term (current) drug therapy: Secondary | ICD-10-CM | POA: Diagnosis not present

## 2020-06-18 DIAGNOSIS — M542 Cervicalgia: Secondary | ICD-10-CM | POA: Diagnosis not present

## 2020-06-18 DIAGNOSIS — R69 Illness, unspecified: Secondary | ICD-10-CM | POA: Diagnosis not present

## 2020-06-18 DIAGNOSIS — G8929 Other chronic pain: Secondary | ICD-10-CM | POA: Diagnosis not present

## 2020-06-18 DIAGNOSIS — M545 Low back pain, unspecified: Secondary | ICD-10-CM | POA: Diagnosis not present

## 2020-06-19 ENCOUNTER — Ambulatory Visit: Payer: Medicare HMO | Admitting: Neurology

## 2020-07-16 DIAGNOSIS — R69 Illness, unspecified: Secondary | ICD-10-CM | POA: Diagnosis not present

## 2020-07-16 DIAGNOSIS — F17210 Nicotine dependence, cigarettes, uncomplicated: Secondary | ICD-10-CM | POA: Diagnosis not present

## 2020-07-16 DIAGNOSIS — E669 Obesity, unspecified: Secondary | ICD-10-CM | POA: Diagnosis not present

## 2020-07-16 DIAGNOSIS — Z79899 Other long term (current) drug therapy: Secondary | ICD-10-CM | POA: Diagnosis not present

## 2020-07-16 DIAGNOSIS — Z6830 Body mass index (BMI) 30.0-30.9, adult: Secondary | ICD-10-CM | POA: Diagnosis not present

## 2020-07-16 DIAGNOSIS — M539 Dorsopathy, unspecified: Secondary | ICD-10-CM | POA: Diagnosis not present

## 2020-07-16 DIAGNOSIS — G894 Chronic pain syndrome: Secondary | ICD-10-CM | POA: Diagnosis not present

## 2020-07-20 DIAGNOSIS — R6884 Jaw pain: Secondary | ICD-10-CM | POA: Diagnosis not present

## 2020-07-20 DIAGNOSIS — M810 Age-related osteoporosis without current pathological fracture: Secondary | ICD-10-CM | POA: Diagnosis not present

## 2020-07-26 ENCOUNTER — Telehealth: Payer: Self-pay

## 2020-07-26 NOTE — Telephone Encounter (Signed)
Referral notes sent from [facility] at [place], Phone #: [phone], Fax #: [phone]   Notes sent to scheduling

## 2020-09-14 DIAGNOSIS — R69 Illness, unspecified: Secondary | ICD-10-CM | POA: Diagnosis not present

## 2020-09-14 DIAGNOSIS — M539 Dorsopathy, unspecified: Secondary | ICD-10-CM | POA: Diagnosis not present

## 2020-09-14 DIAGNOSIS — G894 Chronic pain syndrome: Secondary | ICD-10-CM | POA: Diagnosis not present

## 2020-09-14 DIAGNOSIS — F17210 Nicotine dependence, cigarettes, uncomplicated: Secondary | ICD-10-CM | POA: Diagnosis not present

## 2020-09-14 DIAGNOSIS — Z79899 Other long term (current) drug therapy: Secondary | ICD-10-CM | POA: Diagnosis not present

## 2020-09-14 DIAGNOSIS — E669 Obesity, unspecified: Secondary | ICD-10-CM | POA: Diagnosis not present

## 2020-09-14 DIAGNOSIS — Z6831 Body mass index (BMI) 31.0-31.9, adult: Secondary | ICD-10-CM | POA: Diagnosis not present

## 2020-09-18 ENCOUNTER — Ambulatory Visit
Admission: RE | Admit: 2020-09-18 | Discharge: 2020-09-18 | Disposition: A | Discharge status: Home / Self Care | Payer: Medicare HMO | Source: Ambulatory Visit | Attending: Family Medicine | Admitting: Family Medicine

## 2020-09-18 ENCOUNTER — Other Ambulatory Visit: Payer: Self-pay | Admitting: Family Medicine

## 2020-09-18 ENCOUNTER — Other Ambulatory Visit: Payer: Self-pay

## 2020-09-18 DIAGNOSIS — R6884 Jaw pain: Secondary | ICD-10-CM

## 2020-09-20 DIAGNOSIS — I10 Essential (primary) hypertension: Secondary | ICD-10-CM | POA: Diagnosis not present

## 2020-11-06 DIAGNOSIS — R69 Illness, unspecified: Secondary | ICD-10-CM | POA: Diagnosis not present

## 2020-11-06 DIAGNOSIS — Z6831 Body mass index (BMI) 31.0-31.9, adult: Secondary | ICD-10-CM | POA: Diagnosis not present

## 2020-11-06 DIAGNOSIS — M539 Dorsopathy, unspecified: Secondary | ICD-10-CM | POA: Diagnosis not present

## 2020-11-06 DIAGNOSIS — E669 Obesity, unspecified: Secondary | ICD-10-CM | POA: Diagnosis not present

## 2020-11-06 DIAGNOSIS — G894 Chronic pain syndrome: Secondary | ICD-10-CM | POA: Diagnosis not present

## 2020-11-06 DIAGNOSIS — F17210 Nicotine dependence, cigarettes, uncomplicated: Secondary | ICD-10-CM | POA: Diagnosis not present

## 2020-11-06 DIAGNOSIS — Z79899 Other long term (current) drug therapy: Secondary | ICD-10-CM | POA: Diagnosis not present

## 2020-12-07 DIAGNOSIS — F17210 Nicotine dependence, cigarettes, uncomplicated: Secondary | ICD-10-CM | POA: Diagnosis not present

## 2020-12-07 DIAGNOSIS — R69 Illness, unspecified: Secondary | ICD-10-CM | POA: Diagnosis not present

## 2020-12-07 DIAGNOSIS — G894 Chronic pain syndrome: Secondary | ICD-10-CM | POA: Diagnosis not present

## 2020-12-07 DIAGNOSIS — E669 Obesity, unspecified: Secondary | ICD-10-CM | POA: Diagnosis not present

## 2020-12-07 DIAGNOSIS — Z6831 Body mass index (BMI) 31.0-31.9, adult: Secondary | ICD-10-CM | POA: Diagnosis not present

## 2020-12-07 DIAGNOSIS — M539 Dorsopathy, unspecified: Secondary | ICD-10-CM | POA: Diagnosis not present

## 2020-12-07 DIAGNOSIS — Z79899 Other long term (current) drug therapy: Secondary | ICD-10-CM | POA: Diagnosis not present

## 2020-12-25 DIAGNOSIS — R69 Illness, unspecified: Secondary | ICD-10-CM | POA: Diagnosis not present

## 2020-12-25 DIAGNOSIS — Z79899 Other long term (current) drug therapy: Secondary | ICD-10-CM | POA: Diagnosis not present

## 2020-12-25 DIAGNOSIS — G894 Chronic pain syndrome: Secondary | ICD-10-CM | POA: Diagnosis not present

## 2020-12-25 DIAGNOSIS — F17210 Nicotine dependence, cigarettes, uncomplicated: Secondary | ICD-10-CM | POA: Diagnosis not present

## 2020-12-25 DIAGNOSIS — M539 Dorsopathy, unspecified: Secondary | ICD-10-CM | POA: Diagnosis not present

## 2021-01-17 DIAGNOSIS — I10 Essential (primary) hypertension: Secondary | ICD-10-CM | POA: Diagnosis not present

## 2021-01-17 DIAGNOSIS — G894 Chronic pain syndrome: Secondary | ICD-10-CM | POA: Diagnosis not present

## 2021-01-17 DIAGNOSIS — Z720 Tobacco use: Secondary | ICD-10-CM | POA: Diagnosis not present

## 2021-01-17 DIAGNOSIS — Z0000 Encounter for general adult medical examination without abnormal findings: Secondary | ICD-10-CM | POA: Diagnosis not present

## 2021-01-17 DIAGNOSIS — E039 Hypothyroidism, unspecified: Secondary | ICD-10-CM | POA: Diagnosis not present

## 2021-01-17 DIAGNOSIS — I700 Atherosclerosis of aorta: Secondary | ICD-10-CM | POA: Diagnosis not present

## 2021-01-17 DIAGNOSIS — E7800 Pure hypercholesterolemia, unspecified: Secondary | ICD-10-CM | POA: Diagnosis not present

## 2021-01-17 DIAGNOSIS — Z23 Encounter for immunization: Secondary | ICD-10-CM | POA: Diagnosis not present

## 2021-01-17 DIAGNOSIS — J449 Chronic obstructive pulmonary disease, unspecified: Secondary | ICD-10-CM | POA: Diagnosis not present

## 2021-01-23 DIAGNOSIS — Z86010 Personal history of colonic polyps: Secondary | ICD-10-CM | POA: Diagnosis not present

## 2021-01-23 DIAGNOSIS — K219 Gastro-esophageal reflux disease without esophagitis: Secondary | ICD-10-CM | POA: Diagnosis not present

## 2021-01-23 DIAGNOSIS — R110 Nausea: Secondary | ICD-10-CM | POA: Diagnosis not present

## 2021-01-24 DIAGNOSIS — Z79899 Other long term (current) drug therapy: Secondary | ICD-10-CM | POA: Diagnosis not present

## 2021-01-24 DIAGNOSIS — R030 Elevated blood-pressure reading, without diagnosis of hypertension: Secondary | ICD-10-CM | POA: Diagnosis not present

## 2021-01-24 DIAGNOSIS — Z6832 Body mass index (BMI) 32.0-32.9, adult: Secondary | ICD-10-CM | POA: Diagnosis not present

## 2021-01-24 DIAGNOSIS — F17210 Nicotine dependence, cigarettes, uncomplicated: Secondary | ICD-10-CM | POA: Diagnosis not present

## 2021-01-24 DIAGNOSIS — G894 Chronic pain syndrome: Secondary | ICD-10-CM | POA: Diagnosis not present

## 2021-01-24 DIAGNOSIS — R69 Illness, unspecified: Secondary | ICD-10-CM | POA: Diagnosis not present

## 2021-01-24 DIAGNOSIS — M539 Dorsopathy, unspecified: Secondary | ICD-10-CM | POA: Diagnosis not present

## 2021-02-25 DIAGNOSIS — R030 Elevated blood-pressure reading, without diagnosis of hypertension: Secondary | ICD-10-CM | POA: Diagnosis not present

## 2021-02-25 DIAGNOSIS — R69 Illness, unspecified: Secondary | ICD-10-CM | POA: Diagnosis not present

## 2021-02-25 DIAGNOSIS — Z79899 Other long term (current) drug therapy: Secondary | ICD-10-CM | POA: Diagnosis not present

## 2021-02-25 DIAGNOSIS — M539 Dorsopathy, unspecified: Secondary | ICD-10-CM | POA: Diagnosis not present

## 2021-02-25 DIAGNOSIS — Z9181 History of falling: Secondary | ICD-10-CM | POA: Diagnosis not present

## 2021-02-25 DIAGNOSIS — G894 Chronic pain syndrome: Secondary | ICD-10-CM | POA: Diagnosis not present

## 2021-02-25 DIAGNOSIS — F17210 Nicotine dependence, cigarettes, uncomplicated: Secondary | ICD-10-CM | POA: Diagnosis not present

## 2021-02-25 DIAGNOSIS — Z0000 Encounter for general adult medical examination without abnormal findings: Secondary | ICD-10-CM | POA: Diagnosis not present

## 2021-03-07 DIAGNOSIS — M545 Low back pain, unspecified: Secondary | ICD-10-CM | POA: Diagnosis not present

## 2021-03-07 DIAGNOSIS — M542 Cervicalgia: Secondary | ICD-10-CM | POA: Diagnosis not present

## 2021-03-12 DIAGNOSIS — M542 Cervicalgia: Secondary | ICD-10-CM | POA: Diagnosis not present

## 2021-03-12 DIAGNOSIS — M545 Low back pain, unspecified: Secondary | ICD-10-CM | POA: Diagnosis not present

## 2021-04-08 DIAGNOSIS — M539 Dorsopathy, unspecified: Secondary | ICD-10-CM | POA: Diagnosis not present

## 2021-04-08 DIAGNOSIS — F17210 Nicotine dependence, cigarettes, uncomplicated: Secondary | ICD-10-CM | POA: Diagnosis not present

## 2021-04-08 DIAGNOSIS — Z79899 Other long term (current) drug therapy: Secondary | ICD-10-CM | POA: Diagnosis not present

## 2021-04-08 DIAGNOSIS — G894 Chronic pain syndrome: Secondary | ICD-10-CM | POA: Diagnosis not present

## 2021-04-08 DIAGNOSIS — R030 Elevated blood-pressure reading, without diagnosis of hypertension: Secondary | ICD-10-CM | POA: Diagnosis not present

## 2021-04-08 DIAGNOSIS — Z9181 History of falling: Secondary | ICD-10-CM | POA: Diagnosis not present

## 2021-04-08 DIAGNOSIS — R69 Illness, unspecified: Secondary | ICD-10-CM | POA: Diagnosis not present

## 2021-05-27 DIAGNOSIS — R030 Elevated blood-pressure reading, without diagnosis of hypertension: Secondary | ICD-10-CM | POA: Diagnosis not present

## 2021-05-27 DIAGNOSIS — G894 Chronic pain syndrome: Secondary | ICD-10-CM | POA: Diagnosis not present

## 2021-05-27 DIAGNOSIS — Z9181 History of falling: Secondary | ICD-10-CM | POA: Diagnosis not present

## 2021-05-27 DIAGNOSIS — M539 Dorsopathy, unspecified: Secondary | ICD-10-CM | POA: Diagnosis not present

## 2021-05-27 DIAGNOSIS — Z79899 Other long term (current) drug therapy: Secondary | ICD-10-CM | POA: Diagnosis not present

## 2021-05-27 DIAGNOSIS — F17210 Nicotine dependence, cigarettes, uncomplicated: Secondary | ICD-10-CM | POA: Diagnosis not present

## 2021-05-27 DIAGNOSIS — R69 Illness, unspecified: Secondary | ICD-10-CM | POA: Diagnosis not present

## 2021-06-24 DIAGNOSIS — R030 Elevated blood-pressure reading, without diagnosis of hypertension: Secondary | ICD-10-CM | POA: Diagnosis not present

## 2021-06-24 DIAGNOSIS — Z9181 History of falling: Secondary | ICD-10-CM | POA: Diagnosis not present

## 2021-06-24 DIAGNOSIS — M539 Dorsopathy, unspecified: Secondary | ICD-10-CM | POA: Diagnosis not present

## 2021-06-24 DIAGNOSIS — R69 Illness, unspecified: Secondary | ICD-10-CM | POA: Diagnosis not present

## 2021-06-24 DIAGNOSIS — Z79899 Other long term (current) drug therapy: Secondary | ICD-10-CM | POA: Diagnosis not present

## 2021-06-24 DIAGNOSIS — G894 Chronic pain syndrome: Secondary | ICD-10-CM | POA: Diagnosis not present

## 2021-06-24 DIAGNOSIS — F17210 Nicotine dependence, cigarettes, uncomplicated: Secondary | ICD-10-CM | POA: Diagnosis not present

## 2021-07-24 DIAGNOSIS — R69 Illness, unspecified: Secondary | ICD-10-CM | POA: Diagnosis not present

## 2021-07-24 DIAGNOSIS — G894 Chronic pain syndrome: Secondary | ICD-10-CM | POA: Diagnosis not present

## 2021-07-24 DIAGNOSIS — F17210 Nicotine dependence, cigarettes, uncomplicated: Secondary | ICD-10-CM | POA: Diagnosis not present

## 2021-07-24 DIAGNOSIS — Z79899 Other long term (current) drug therapy: Secondary | ICD-10-CM | POA: Diagnosis not present

## 2021-07-24 DIAGNOSIS — M539 Dorsopathy, unspecified: Secondary | ICD-10-CM | POA: Diagnosis not present

## 2021-07-24 DIAGNOSIS — R030 Elevated blood-pressure reading, without diagnosis of hypertension: Secondary | ICD-10-CM | POA: Diagnosis not present

## 2021-08-22 DIAGNOSIS — F17210 Nicotine dependence, cigarettes, uncomplicated: Secondary | ICD-10-CM | POA: Diagnosis not present

## 2021-08-22 DIAGNOSIS — Z6832 Body mass index (BMI) 32.0-32.9, adult: Secondary | ICD-10-CM | POA: Diagnosis not present

## 2021-08-22 DIAGNOSIS — G894 Chronic pain syndrome: Secondary | ICD-10-CM | POA: Diagnosis not present

## 2021-08-22 DIAGNOSIS — E559 Vitamin D deficiency, unspecified: Secondary | ICD-10-CM | POA: Diagnosis not present

## 2021-08-22 DIAGNOSIS — R69 Illness, unspecified: Secondary | ICD-10-CM | POA: Diagnosis not present

## 2021-08-22 DIAGNOSIS — M539 Dorsopathy, unspecified: Secondary | ICD-10-CM | POA: Diagnosis not present

## 2021-08-22 DIAGNOSIS — Z79899 Other long term (current) drug therapy: Secondary | ICD-10-CM | POA: Diagnosis not present

## 2021-08-22 DIAGNOSIS — R030 Elevated blood-pressure reading, without diagnosis of hypertension: Secondary | ICD-10-CM | POA: Diagnosis not present

## 2021-09-10 DIAGNOSIS — H40013 Open angle with borderline findings, low risk, bilateral: Secondary | ICD-10-CM | POA: Diagnosis not present

## 2021-09-23 DIAGNOSIS — R030 Elevated blood-pressure reading, without diagnosis of hypertension: Secondary | ICD-10-CM | POA: Diagnosis not present

## 2021-09-23 DIAGNOSIS — M539 Dorsopathy, unspecified: Secondary | ICD-10-CM | POA: Diagnosis not present

## 2021-09-23 DIAGNOSIS — G894 Chronic pain syndrome: Secondary | ICD-10-CM | POA: Diagnosis not present

## 2021-09-23 DIAGNOSIS — R7989 Other specified abnormal findings of blood chemistry: Secondary | ICD-10-CM | POA: Diagnosis not present

## 2021-09-23 DIAGNOSIS — F17210 Nicotine dependence, cigarettes, uncomplicated: Secondary | ICD-10-CM | POA: Diagnosis not present

## 2021-09-23 DIAGNOSIS — Z6833 Body mass index (BMI) 33.0-33.9, adult: Secondary | ICD-10-CM | POA: Diagnosis not present

## 2021-09-23 DIAGNOSIS — R69 Illness, unspecified: Secondary | ICD-10-CM | POA: Diagnosis not present

## 2021-09-23 DIAGNOSIS — Z79899 Other long term (current) drug therapy: Secondary | ICD-10-CM | POA: Diagnosis not present

## 2021-10-23 DIAGNOSIS — R7989 Other specified abnormal findings of blood chemistry: Secondary | ICD-10-CM | POA: Diagnosis not present

## 2021-10-23 DIAGNOSIS — F17210 Nicotine dependence, cigarettes, uncomplicated: Secondary | ICD-10-CM | POA: Diagnosis not present

## 2021-10-23 DIAGNOSIS — Z6833 Body mass index (BMI) 33.0-33.9, adult: Secondary | ICD-10-CM | POA: Diagnosis not present

## 2021-10-23 DIAGNOSIS — G894 Chronic pain syndrome: Secondary | ICD-10-CM | POA: Diagnosis not present

## 2021-10-23 DIAGNOSIS — R69 Illness, unspecified: Secondary | ICD-10-CM | POA: Diagnosis not present

## 2021-10-23 DIAGNOSIS — R030 Elevated blood-pressure reading, without diagnosis of hypertension: Secondary | ICD-10-CM | POA: Diagnosis not present

## 2021-10-23 DIAGNOSIS — Z9181 History of falling: Secondary | ICD-10-CM | POA: Diagnosis not present

## 2021-10-23 DIAGNOSIS — Z79899 Other long term (current) drug therapy: Secondary | ICD-10-CM | POA: Diagnosis not present

## 2021-10-23 DIAGNOSIS — M539 Dorsopathy, unspecified: Secondary | ICD-10-CM | POA: Diagnosis not present

## 2021-11-06 DIAGNOSIS — N3000 Acute cystitis without hematuria: Secondary | ICD-10-CM | POA: Diagnosis not present

## 2021-11-06 DIAGNOSIS — R8290 Unspecified abnormal findings in urine: Secondary | ICD-10-CM | POA: Diagnosis not present

## 2021-11-06 DIAGNOSIS — R252 Cramp and spasm: Secondary | ICD-10-CM | POA: Diagnosis not present

## 2021-11-06 DIAGNOSIS — E039 Hypothyroidism, unspecified: Secondary | ICD-10-CM | POA: Diagnosis not present

## 2021-11-06 DIAGNOSIS — R42 Dizziness and giddiness: Secondary | ICD-10-CM | POA: Diagnosis not present

## 2021-11-20 DIAGNOSIS — R69 Illness, unspecified: Secondary | ICD-10-CM | POA: Diagnosis not present

## 2021-11-20 DIAGNOSIS — Z6832 Body mass index (BMI) 32.0-32.9, adult: Secondary | ICD-10-CM | POA: Diagnosis not present

## 2021-11-20 DIAGNOSIS — G894 Chronic pain syndrome: Secondary | ICD-10-CM | POA: Diagnosis not present

## 2021-11-20 DIAGNOSIS — M539 Dorsopathy, unspecified: Secondary | ICD-10-CM | POA: Diagnosis not present

## 2021-11-20 DIAGNOSIS — Z9181 History of falling: Secondary | ICD-10-CM | POA: Diagnosis not present

## 2021-11-20 DIAGNOSIS — R7989 Other specified abnormal findings of blood chemistry: Secondary | ICD-10-CM | POA: Diagnosis not present

## 2021-11-20 DIAGNOSIS — F17210 Nicotine dependence, cigarettes, uncomplicated: Secondary | ICD-10-CM | POA: Diagnosis not present

## 2021-11-20 DIAGNOSIS — R030 Elevated blood-pressure reading, without diagnosis of hypertension: Secondary | ICD-10-CM | POA: Diagnosis not present

## 2021-11-20 DIAGNOSIS — Z79899 Other long term (current) drug therapy: Secondary | ICD-10-CM | POA: Diagnosis not present

## 2021-12-18 DIAGNOSIS — M25552 Pain in left hip: Secondary | ICD-10-CM | POA: Diagnosis not present

## 2021-12-24 DIAGNOSIS — R69 Illness, unspecified: Secondary | ICD-10-CM | POA: Diagnosis not present

## 2021-12-24 DIAGNOSIS — Z79899 Other long term (current) drug therapy: Secondary | ICD-10-CM | POA: Diagnosis not present

## 2021-12-24 DIAGNOSIS — R7989 Other specified abnormal findings of blood chemistry: Secondary | ICD-10-CM | POA: Diagnosis not present

## 2021-12-24 DIAGNOSIS — Z6832 Body mass index (BMI) 32.0-32.9, adult: Secondary | ICD-10-CM | POA: Diagnosis not present

## 2021-12-24 DIAGNOSIS — Z9181 History of falling: Secondary | ICD-10-CM | POA: Diagnosis not present

## 2021-12-24 DIAGNOSIS — M545 Low back pain, unspecified: Secondary | ICD-10-CM | POA: Diagnosis not present

## 2021-12-24 DIAGNOSIS — M539 Dorsopathy, unspecified: Secondary | ICD-10-CM | POA: Diagnosis not present

## 2021-12-24 DIAGNOSIS — R030 Elevated blood-pressure reading, without diagnosis of hypertension: Secondary | ICD-10-CM | POA: Diagnosis not present

## 2021-12-24 DIAGNOSIS — F17210 Nicotine dependence, cigarettes, uncomplicated: Secondary | ICD-10-CM | POA: Diagnosis not present

## 2022-01-23 DIAGNOSIS — Z6833 Body mass index (BMI) 33.0-33.9, adult: Secondary | ICD-10-CM | POA: Diagnosis not present

## 2022-01-23 DIAGNOSIS — F17210 Nicotine dependence, cigarettes, uncomplicated: Secondary | ICD-10-CM | POA: Diagnosis not present

## 2022-01-23 DIAGNOSIS — Z9181 History of falling: Secondary | ICD-10-CM | POA: Diagnosis not present

## 2022-01-23 DIAGNOSIS — Z79899 Other long term (current) drug therapy: Secondary | ICD-10-CM | POA: Diagnosis not present

## 2022-01-23 DIAGNOSIS — M539 Dorsopathy, unspecified: Secondary | ICD-10-CM | POA: Diagnosis not present

## 2022-01-23 DIAGNOSIS — G894 Chronic pain syndrome: Secondary | ICD-10-CM | POA: Diagnosis not present

## 2022-01-23 DIAGNOSIS — R030 Elevated blood-pressure reading, without diagnosis of hypertension: Secondary | ICD-10-CM | POA: Diagnosis not present

## 2022-01-23 DIAGNOSIS — M25552 Pain in left hip: Secondary | ICD-10-CM | POA: Diagnosis not present

## 2022-01-23 DIAGNOSIS — R7989 Other specified abnormal findings of blood chemistry: Secondary | ICD-10-CM | POA: Diagnosis not present

## 2022-01-23 DIAGNOSIS — R69 Illness, unspecified: Secondary | ICD-10-CM | POA: Diagnosis not present

## 2022-02-06 DIAGNOSIS — Z0000 Encounter for general adult medical examination without abnormal findings: Secondary | ICD-10-CM | POA: Diagnosis not present

## 2022-02-06 DIAGNOSIS — R7301 Impaired fasting glucose: Secondary | ICD-10-CM | POA: Diagnosis not present

## 2022-02-06 DIAGNOSIS — G894 Chronic pain syndrome: Secondary | ICD-10-CM | POA: Diagnosis not present

## 2022-02-06 DIAGNOSIS — I10 Essential (primary) hypertension: Secondary | ICD-10-CM | POA: Diagnosis not present

## 2022-02-06 DIAGNOSIS — E039 Hypothyroidism, unspecified: Secondary | ICD-10-CM | POA: Diagnosis not present

## 2022-02-06 DIAGNOSIS — E7800 Pure hypercholesterolemia, unspecified: Secondary | ICD-10-CM | POA: Diagnosis not present

## 2022-02-06 DIAGNOSIS — Z720 Tobacco use: Secondary | ICD-10-CM | POA: Diagnosis not present

## 2022-02-06 DIAGNOSIS — I700 Atherosclerosis of aorta: Secondary | ICD-10-CM | POA: Diagnosis not present

## 2022-02-06 DIAGNOSIS — Z23 Encounter for immunization: Secondary | ICD-10-CM | POA: Diagnosis not present

## 2022-02-06 DIAGNOSIS — J449 Chronic obstructive pulmonary disease, unspecified: Secondary | ICD-10-CM | POA: Diagnosis not present

## 2022-03-06 DIAGNOSIS — Z6832 Body mass index (BMI) 32.0-32.9, adult: Secondary | ICD-10-CM | POA: Diagnosis not present

## 2022-03-06 DIAGNOSIS — M25552 Pain in left hip: Secondary | ICD-10-CM | POA: Diagnosis not present

## 2022-03-06 DIAGNOSIS — R030 Elevated blood-pressure reading, without diagnosis of hypertension: Secondary | ICD-10-CM | POA: Diagnosis not present

## 2022-03-06 DIAGNOSIS — R69 Illness, unspecified: Secondary | ICD-10-CM | POA: Diagnosis not present

## 2022-03-06 DIAGNOSIS — R7989 Other specified abnormal findings of blood chemistry: Secondary | ICD-10-CM | POA: Diagnosis not present

## 2022-03-06 DIAGNOSIS — F17210 Nicotine dependence, cigarettes, uncomplicated: Secondary | ICD-10-CM | POA: Diagnosis not present

## 2022-03-06 DIAGNOSIS — Z9181 History of falling: Secondary | ICD-10-CM | POA: Diagnosis not present

## 2022-03-06 DIAGNOSIS — G894 Chronic pain syndrome: Secondary | ICD-10-CM | POA: Diagnosis not present

## 2022-03-06 DIAGNOSIS — M539 Dorsopathy, unspecified: Secondary | ICD-10-CM | POA: Diagnosis not present

## 2022-03-06 DIAGNOSIS — Z79899 Other long term (current) drug therapy: Secondary | ICD-10-CM | POA: Diagnosis not present

## 2022-03-25 ENCOUNTER — Encounter: Payer: Self-pay | Admitting: *Deleted

## 2022-04-16 DIAGNOSIS — R69 Illness, unspecified: Secondary | ICD-10-CM | POA: Diagnosis not present

## 2022-04-16 DIAGNOSIS — R7989 Other specified abnormal findings of blood chemistry: Secondary | ICD-10-CM | POA: Diagnosis not present

## 2022-04-16 DIAGNOSIS — M25552 Pain in left hip: Secondary | ICD-10-CM | POA: Diagnosis not present

## 2022-04-16 DIAGNOSIS — F17210 Nicotine dependence, cigarettes, uncomplicated: Secondary | ICD-10-CM | POA: Diagnosis not present

## 2022-04-16 DIAGNOSIS — Z87891 Personal history of nicotine dependence: Secondary | ICD-10-CM | POA: Diagnosis not present

## 2022-04-16 DIAGNOSIS — Z9181 History of falling: Secondary | ICD-10-CM | POA: Diagnosis not present

## 2022-04-16 DIAGNOSIS — M539 Dorsopathy, unspecified: Secondary | ICD-10-CM | POA: Diagnosis not present

## 2022-04-16 DIAGNOSIS — Z5320 Procedure and treatment not carried out because of patient's decision for unspecified reasons: Secondary | ICD-10-CM | POA: Diagnosis not present

## 2022-04-16 DIAGNOSIS — Z79899 Other long term (current) drug therapy: Secondary | ICD-10-CM | POA: Diagnosis not present

## 2022-04-16 DIAGNOSIS — R030 Elevated blood-pressure reading, without diagnosis of hypertension: Secondary | ICD-10-CM | POA: Diagnosis not present

## 2022-04-16 DIAGNOSIS — Z6832 Body mass index (BMI) 32.0-32.9, adult: Secondary | ICD-10-CM | POA: Diagnosis not present

## 2022-04-16 DIAGNOSIS — G894 Chronic pain syndrome: Secondary | ICD-10-CM | POA: Diagnosis not present

## 2022-05-09 DIAGNOSIS — Z9181 History of falling: Secondary | ICD-10-CM | POA: Diagnosis not present

## 2022-05-09 DIAGNOSIS — Z87891 Personal history of nicotine dependence: Secondary | ICD-10-CM | POA: Diagnosis not present

## 2022-05-09 DIAGNOSIS — F17210 Nicotine dependence, cigarettes, uncomplicated: Secondary | ICD-10-CM | POA: Diagnosis not present

## 2022-05-09 DIAGNOSIS — R69 Illness, unspecified: Secondary | ICD-10-CM | POA: Diagnosis not present

## 2022-05-09 DIAGNOSIS — M539 Dorsopathy, unspecified: Secondary | ICD-10-CM | POA: Diagnosis not present

## 2022-05-09 DIAGNOSIS — R030 Elevated blood-pressure reading, without diagnosis of hypertension: Secondary | ICD-10-CM | POA: Diagnosis not present

## 2022-05-09 DIAGNOSIS — Z79899 Other long term (current) drug therapy: Secondary | ICD-10-CM | POA: Diagnosis not present

## 2022-05-09 DIAGNOSIS — M25552 Pain in left hip: Secondary | ICD-10-CM | POA: Diagnosis not present

## 2022-05-09 DIAGNOSIS — Z5320 Procedure and treatment not carried out because of patient's decision for unspecified reasons: Secondary | ICD-10-CM | POA: Diagnosis not present

## 2022-05-09 DIAGNOSIS — Z6831 Body mass index (BMI) 31.0-31.9, adult: Secondary | ICD-10-CM | POA: Diagnosis not present

## 2022-05-09 DIAGNOSIS — G894 Chronic pain syndrome: Secondary | ICD-10-CM | POA: Diagnosis not present

## 2022-05-09 DIAGNOSIS — R7989 Other specified abnormal findings of blood chemistry: Secondary | ICD-10-CM | POA: Diagnosis not present

## 2022-06-20 DIAGNOSIS — M539 Dorsopathy, unspecified: Secondary | ICD-10-CM | POA: Diagnosis not present

## 2022-06-20 DIAGNOSIS — Z9181 History of falling: Secondary | ICD-10-CM | POA: Diagnosis not present

## 2022-06-20 DIAGNOSIS — Z79899 Other long term (current) drug therapy: Secondary | ICD-10-CM | POA: Diagnosis not present

## 2022-06-20 DIAGNOSIS — M25552 Pain in left hip: Secondary | ICD-10-CM | POA: Diagnosis not present

## 2022-06-20 DIAGNOSIS — Z87891 Personal history of nicotine dependence: Secondary | ICD-10-CM | POA: Diagnosis not present

## 2022-06-20 DIAGNOSIS — Z5320 Procedure and treatment not carried out because of patient's decision for unspecified reasons: Secondary | ICD-10-CM | POA: Diagnosis not present

## 2022-06-20 DIAGNOSIS — R69 Illness, unspecified: Secondary | ICD-10-CM | POA: Diagnosis not present

## 2022-06-20 DIAGNOSIS — Z6832 Body mass index (BMI) 32.0-32.9, adult: Secondary | ICD-10-CM | POA: Diagnosis not present

## 2022-06-20 DIAGNOSIS — G894 Chronic pain syndrome: Secondary | ICD-10-CM | POA: Diagnosis not present

## 2022-06-20 DIAGNOSIS — R7989 Other specified abnormal findings of blood chemistry: Secondary | ICD-10-CM | POA: Diagnosis not present

## 2022-06-20 DIAGNOSIS — R030 Elevated blood-pressure reading, without diagnosis of hypertension: Secondary | ICD-10-CM | POA: Diagnosis not present

## 2022-06-20 DIAGNOSIS — F17210 Nicotine dependence, cigarettes, uncomplicated: Secondary | ICD-10-CM | POA: Diagnosis not present

## 2022-07-28 DIAGNOSIS — R030 Elevated blood-pressure reading, without diagnosis of hypertension: Secondary | ICD-10-CM | POA: Diagnosis not present

## 2022-07-28 DIAGNOSIS — Z79899 Other long term (current) drug therapy: Secondary | ICD-10-CM | POA: Diagnosis not present

## 2022-07-28 DIAGNOSIS — M25552 Pain in left hip: Secondary | ICD-10-CM | POA: Diagnosis not present

## 2022-07-28 DIAGNOSIS — Z9181 History of falling: Secondary | ICD-10-CM | POA: Diagnosis not present

## 2022-07-28 DIAGNOSIS — G894 Chronic pain syndrome: Secondary | ICD-10-CM | POA: Diagnosis not present

## 2022-07-28 DIAGNOSIS — M539 Dorsopathy, unspecified: Secondary | ICD-10-CM | POA: Diagnosis not present

## 2022-07-28 DIAGNOSIS — Z6831 Body mass index (BMI) 31.0-31.9, adult: Secondary | ICD-10-CM | POA: Diagnosis not present

## 2022-07-28 DIAGNOSIS — R7989 Other specified abnormal findings of blood chemistry: Secondary | ICD-10-CM | POA: Diagnosis not present

## 2022-08-04 DIAGNOSIS — I700 Atherosclerosis of aorta: Secondary | ICD-10-CM | POA: Diagnosis not present

## 2022-08-04 DIAGNOSIS — Z8673 Personal history of transient ischemic attack (TIA), and cerebral infarction without residual deficits: Secondary | ICD-10-CM | POA: Diagnosis not present

## 2022-08-04 DIAGNOSIS — G894 Chronic pain syndrome: Secondary | ICD-10-CM | POA: Diagnosis not present

## 2022-08-04 DIAGNOSIS — E7800 Pure hypercholesterolemia, unspecified: Secondary | ICD-10-CM | POA: Diagnosis not present

## 2022-08-04 DIAGNOSIS — I10 Essential (primary) hypertension: Secondary | ICD-10-CM | POA: Diagnosis not present

## 2022-08-04 DIAGNOSIS — R7303 Prediabetes: Secondary | ICD-10-CM | POA: Diagnosis not present

## 2022-08-04 DIAGNOSIS — J449 Chronic obstructive pulmonary disease, unspecified: Secondary | ICD-10-CM | POA: Diagnosis not present

## 2022-08-04 DIAGNOSIS — E039 Hypothyroidism, unspecified: Secondary | ICD-10-CM | POA: Diagnosis not present

## 2022-08-04 DIAGNOSIS — R7309 Other abnormal glucose: Secondary | ICD-10-CM | POA: Diagnosis not present

## 2022-08-12 DIAGNOSIS — Z86010 Personal history of colonic polyps: Secondary | ICD-10-CM | POA: Diagnosis not present

## 2022-08-12 DIAGNOSIS — R198 Other specified symptoms and signs involving the digestive system and abdomen: Secondary | ICD-10-CM | POA: Diagnosis not present

## 2022-08-12 DIAGNOSIS — Z8673 Personal history of transient ischemic attack (TIA), and cerebral infarction without residual deficits: Secondary | ICD-10-CM | POA: Diagnosis not present

## 2022-08-12 DIAGNOSIS — K219 Gastro-esophageal reflux disease without esophagitis: Secondary | ICD-10-CM | POA: Diagnosis not present

## 2022-08-26 DIAGNOSIS — Z79899 Other long term (current) drug therapy: Secondary | ICD-10-CM | POA: Diagnosis not present

## 2022-08-26 DIAGNOSIS — G894 Chronic pain syndrome: Secondary | ICD-10-CM | POA: Diagnosis not present

## 2022-08-26 DIAGNOSIS — M539 Dorsopathy, unspecified: Secondary | ICD-10-CM | POA: Diagnosis not present

## 2022-08-26 DIAGNOSIS — M25552 Pain in left hip: Secondary | ICD-10-CM | POA: Diagnosis not present

## 2022-08-26 DIAGNOSIS — R030 Elevated blood-pressure reading, without diagnosis of hypertension: Secondary | ICD-10-CM | POA: Diagnosis not present

## 2022-08-26 DIAGNOSIS — Z6831 Body mass index (BMI) 31.0-31.9, adult: Secondary | ICD-10-CM | POA: Diagnosis not present

## 2022-08-26 DIAGNOSIS — R7989 Other specified abnormal findings of blood chemistry: Secondary | ICD-10-CM | POA: Diagnosis not present

## 2022-08-26 DIAGNOSIS — Z9181 History of falling: Secondary | ICD-10-CM | POA: Diagnosis not present

## 2022-09-03 DIAGNOSIS — K5730 Diverticulosis of large intestine without perforation or abscess without bleeding: Secondary | ICD-10-CM | POA: Diagnosis not present

## 2022-09-03 DIAGNOSIS — D125 Benign neoplasm of sigmoid colon: Secondary | ICD-10-CM | POA: Diagnosis not present

## 2022-09-03 DIAGNOSIS — D124 Benign neoplasm of descending colon: Secondary | ICD-10-CM | POA: Diagnosis not present

## 2022-09-03 DIAGNOSIS — Z09 Encounter for follow-up examination after completed treatment for conditions other than malignant neoplasm: Secondary | ICD-10-CM | POA: Diagnosis not present

## 2022-09-03 DIAGNOSIS — Z86010 Personal history of colonic polyps: Secondary | ICD-10-CM | POA: Diagnosis not present

## 2022-09-03 DIAGNOSIS — K635 Polyp of colon: Secondary | ICD-10-CM | POA: Diagnosis not present

## 2022-09-05 DIAGNOSIS — D125 Benign neoplasm of sigmoid colon: Secondary | ICD-10-CM | POA: Diagnosis not present

## 2022-09-05 DIAGNOSIS — K635 Polyp of colon: Secondary | ICD-10-CM | POA: Diagnosis not present

## 2022-09-05 DIAGNOSIS — D124 Benign neoplasm of descending colon: Secondary | ICD-10-CM | POA: Diagnosis not present

## 2022-09-25 DIAGNOSIS — M539 Dorsopathy, unspecified: Secondary | ICD-10-CM | POA: Diagnosis not present

## 2022-09-25 DIAGNOSIS — F17210 Nicotine dependence, cigarettes, uncomplicated: Secondary | ICD-10-CM | POA: Diagnosis not present

## 2022-09-25 DIAGNOSIS — G894 Chronic pain syndrome: Secondary | ICD-10-CM | POA: Diagnosis not present

## 2022-09-25 DIAGNOSIS — R7989 Other specified abnormal findings of blood chemistry: Secondary | ICD-10-CM | POA: Diagnosis not present

## 2022-09-25 DIAGNOSIS — Z122 Encounter for screening for malignant neoplasm of respiratory organs: Secondary | ICD-10-CM | POA: Diagnosis not present

## 2022-09-25 DIAGNOSIS — R030 Elevated blood-pressure reading, without diagnosis of hypertension: Secondary | ICD-10-CM | POA: Diagnosis not present

## 2022-09-25 DIAGNOSIS — Z79899 Other long term (current) drug therapy: Secondary | ICD-10-CM | POA: Diagnosis not present

## 2022-09-25 DIAGNOSIS — Z9181 History of falling: Secondary | ICD-10-CM | POA: Diagnosis not present

## 2022-09-25 DIAGNOSIS — Z6832 Body mass index (BMI) 32.0-32.9, adult: Secondary | ICD-10-CM | POA: Diagnosis not present

## 2022-09-25 DIAGNOSIS — M25552 Pain in left hip: Secondary | ICD-10-CM | POA: Diagnosis not present

## 2022-09-29 ENCOUNTER — Other Ambulatory Visit: Payer: Self-pay | Admitting: Nurse Practitioner

## 2022-09-29 DIAGNOSIS — Z122 Encounter for screening for malignant neoplasm of respiratory organs: Secondary | ICD-10-CM

## 2022-10-24 DIAGNOSIS — G894 Chronic pain syndrome: Secondary | ICD-10-CM | POA: Diagnosis not present

## 2022-10-24 DIAGNOSIS — Z79899 Other long term (current) drug therapy: Secondary | ICD-10-CM | POA: Diagnosis not present

## 2022-10-24 DIAGNOSIS — Z6832 Body mass index (BMI) 32.0-32.9, adult: Secondary | ICD-10-CM | POA: Diagnosis not present

## 2022-10-24 DIAGNOSIS — Z122 Encounter for screening for malignant neoplasm of respiratory organs: Secondary | ICD-10-CM | POA: Diagnosis not present

## 2022-10-24 DIAGNOSIS — M539 Dorsopathy, unspecified: Secondary | ICD-10-CM | POA: Diagnosis not present

## 2022-10-24 DIAGNOSIS — Z9181 History of falling: Secondary | ICD-10-CM | POA: Diagnosis not present

## 2022-10-24 DIAGNOSIS — R7989 Other specified abnormal findings of blood chemistry: Secondary | ICD-10-CM | POA: Diagnosis not present

## 2022-10-24 DIAGNOSIS — F17210 Nicotine dependence, cigarettes, uncomplicated: Secondary | ICD-10-CM | POA: Diagnosis not present

## 2022-10-24 DIAGNOSIS — R030 Elevated blood-pressure reading, without diagnosis of hypertension: Secondary | ICD-10-CM | POA: Diagnosis not present

## 2022-11-05 DIAGNOSIS — H40013 Open angle with borderline findings, low risk, bilateral: Secondary | ICD-10-CM | POA: Diagnosis not present

## 2022-11-05 DIAGNOSIS — H524 Presbyopia: Secondary | ICD-10-CM | POA: Diagnosis not present

## 2022-11-10 DIAGNOSIS — H539 Unspecified visual disturbance: Secondary | ICD-10-CM | POA: Diagnosis not present

## 2022-11-10 DIAGNOSIS — R252 Cramp and spasm: Secondary | ICD-10-CM | POA: Diagnosis not present

## 2022-11-20 ENCOUNTER — Other Ambulatory Visit: Payer: Self-pay | Admitting: Family Medicine

## 2022-11-20 DIAGNOSIS — H539 Unspecified visual disturbance: Secondary | ICD-10-CM

## 2022-11-21 ENCOUNTER — Ambulatory Visit
Admission: RE | Admit: 2022-11-21 | Discharge: 2022-11-21 | Disposition: A | Discharge status: Home / Self Care | Payer: Medicare HMO | Source: Ambulatory Visit | Attending: Family Medicine | Admitting: Family Medicine

## 2022-11-21 DIAGNOSIS — H539 Unspecified visual disturbance: Secondary | ICD-10-CM

## 2022-11-25 DIAGNOSIS — Z6832 Body mass index (BMI) 32.0-32.9, adult: Secondary | ICD-10-CM | POA: Diagnosis not present

## 2022-11-25 DIAGNOSIS — Z9181 History of falling: Secondary | ICD-10-CM | POA: Diagnosis not present

## 2022-11-25 DIAGNOSIS — G894 Chronic pain syndrome: Secondary | ICD-10-CM | POA: Diagnosis not present

## 2022-11-25 DIAGNOSIS — R030 Elevated blood-pressure reading, without diagnosis of hypertension: Secondary | ICD-10-CM | POA: Diagnosis not present

## 2022-11-25 DIAGNOSIS — E6609 Other obesity due to excess calories: Secondary | ICD-10-CM | POA: Diagnosis not present

## 2022-11-25 DIAGNOSIS — F17210 Nicotine dependence, cigarettes, uncomplicated: Secondary | ICD-10-CM | POA: Diagnosis not present

## 2022-11-25 DIAGNOSIS — Z79899 Other long term (current) drug therapy: Secondary | ICD-10-CM | POA: Diagnosis not present

## 2022-11-25 DIAGNOSIS — R7989 Other specified abnormal findings of blood chemistry: Secondary | ICD-10-CM | POA: Diagnosis not present

## 2022-11-25 DIAGNOSIS — M539 Dorsopathy, unspecified: Secondary | ICD-10-CM | POA: Diagnosis not present

## 2022-11-26 DIAGNOSIS — Z79899 Other long term (current) drug therapy: Secondary | ICD-10-CM | POA: Diagnosis not present

## 2022-11-26 DIAGNOSIS — M545 Low back pain, unspecified: Secondary | ICD-10-CM | POA: Diagnosis not present

## 2022-11-26 DIAGNOSIS — Z5181 Encounter for therapeutic drug level monitoring: Secondary | ICD-10-CM | POA: Diagnosis not present

## 2022-12-15 DIAGNOSIS — M545 Low back pain, unspecified: Secondary | ICD-10-CM | POA: Diagnosis not present

## 2022-12-17 DIAGNOSIS — M545 Low back pain, unspecified: Secondary | ICD-10-CM | POA: Diagnosis not present

## 2022-12-17 DIAGNOSIS — M5416 Radiculopathy, lumbar region: Secondary | ICD-10-CM | POA: Diagnosis not present

## 2022-12-17 DIAGNOSIS — M47896 Other spondylosis, lumbar region: Secondary | ICD-10-CM | POA: Diagnosis not present

## 2022-12-17 DIAGNOSIS — Z79899 Other long term (current) drug therapy: Secondary | ICD-10-CM | POA: Diagnosis not present

## 2023-02-12 ENCOUNTER — Other Ambulatory Visit: Payer: Self-pay | Admitting: Family Medicine

## 2023-02-12 DIAGNOSIS — E2839 Other primary ovarian failure: Secondary | ICD-10-CM

## 2023-03-11 DIAGNOSIS — M47816 Spondylosis without myelopathy or radiculopathy, lumbar region: Secondary | ICD-10-CM | POA: Diagnosis not present

## 2023-03-11 DIAGNOSIS — M5416 Radiculopathy, lumbar region: Secondary | ICD-10-CM | POA: Diagnosis not present

## 2023-03-11 DIAGNOSIS — Z79899 Other long term (current) drug therapy: Secondary | ICD-10-CM | POA: Diagnosis not present

## 2023-06-24 DIAGNOSIS — M47816 Spondylosis without myelopathy or radiculopathy, lumbar region: Secondary | ICD-10-CM | POA: Diagnosis not present

## 2023-06-24 DIAGNOSIS — Z79899 Other long term (current) drug therapy: Secondary | ICD-10-CM | POA: Diagnosis not present

## 2023-06-24 DIAGNOSIS — M5416 Radiculopathy, lumbar region: Secondary | ICD-10-CM | POA: Diagnosis not present

## 2023-09-15 ENCOUNTER — Encounter: Payer: Self-pay | Admitting: *Deleted

## 2023-09-23 ENCOUNTER — Inpatient Hospital Stay: Admission: RE | Admit: 2023-09-23 | Payer: Medicare HMO | Source: Ambulatory Visit

## 2023-09-30 DIAGNOSIS — M25552 Pain in left hip: Secondary | ICD-10-CM | POA: Diagnosis not present

## 2023-09-30 DIAGNOSIS — M47816 Spondylosis without myelopathy or radiculopathy, lumbar region: Secondary | ICD-10-CM | POA: Diagnosis not present

## 2023-09-30 DIAGNOSIS — M5416 Radiculopathy, lumbar region: Secondary | ICD-10-CM | POA: Diagnosis not present

## 2023-09-30 DIAGNOSIS — Z79899 Other long term (current) drug therapy: Secondary | ICD-10-CM | POA: Diagnosis not present

## 2023-12-24 DIAGNOSIS — M5416 Radiculopathy, lumbar region: Secondary | ICD-10-CM | POA: Diagnosis not present

## 2023-12-24 DIAGNOSIS — M25552 Pain in left hip: Secondary | ICD-10-CM | POA: Diagnosis not present

## 2023-12-24 DIAGNOSIS — Z79899 Other long term (current) drug therapy: Secondary | ICD-10-CM | POA: Diagnosis not present

## 2024-01-13 ENCOUNTER — Ambulatory Visit

## 2024-01-13 DIAGNOSIS — E2839 Other primary ovarian failure: Secondary | ICD-10-CM | POA: Diagnosis not present

## 2024-01-26 ENCOUNTER — Other Ambulatory Visit: Payer: Self-pay | Admitting: Family Medicine

## 2024-01-26 DIAGNOSIS — Z720 Tobacco use: Secondary | ICD-10-CM

## 2024-03-23 DIAGNOSIS — M47896 Other spondylosis, lumbar region: Secondary | ICD-10-CM | POA: Diagnosis not present

## 2024-03-23 DIAGNOSIS — M25552 Pain in left hip: Secondary | ICD-10-CM | POA: Diagnosis not present

## 2024-03-23 DIAGNOSIS — Z79899 Other long term (current) drug therapy: Secondary | ICD-10-CM | POA: Diagnosis not present

## 2024-03-23 DIAGNOSIS — M545 Low back pain, unspecified: Secondary | ICD-10-CM | POA: Diagnosis not present

## 2024-03-23 DIAGNOSIS — M5416 Radiculopathy, lumbar region: Secondary | ICD-10-CM | POA: Diagnosis not present

## 2024-04-11 NOTE — Progress Notes (Addendum)
 Tracey Mccullough                                          MRN: [id number]   05/03/2024   The [facility] Quality Team Specialist reviewed this patient medical record for the purposes of chart review for care gap closure. The following were reviewed: chart review for care gap closure-controlling blood pressure.    [facility] Quality Team
